# Patient Record
Sex: Female | Born: 1965 | Race: White | Hispanic: No | Marital: Married | Smoking: Former smoker
Health system: Southern US, Community
[De-identification: ages and names within clinical notes are randomized; demographics above are authoritative.]

## PROBLEM LIST (undated history)

## (undated) ENCOUNTER — Emergency Department: Discharge status: Absent without leave | Payer: Self-pay

## (undated) DIAGNOSIS — G43009 Migraine without aura, not intractable, without status migrainosus: Principal | ICD-10-CM

## (undated) DIAGNOSIS — S0990XA Unspecified injury of head, initial encounter: Secondary | ICD-10-CM

## (undated) DIAGNOSIS — S0990XS Unspecified injury of head, sequela: Secondary | ICD-10-CM

## (undated) DIAGNOSIS — F329 Major depressive disorder, single episode, unspecified: Secondary | ICD-10-CM

## (undated) DIAGNOSIS — K219 Gastro-esophageal reflux disease without esophagitis: Secondary | ICD-10-CM

## (undated) DIAGNOSIS — G4700 Insomnia, unspecified: Secondary | ICD-10-CM

## (undated) DIAGNOSIS — G44309 Post-traumatic headache, unspecified, not intractable: Secondary | ICD-10-CM

## (undated) DIAGNOSIS — F32A Depression, unspecified: Secondary | ICD-10-CM

## (undated) DIAGNOSIS — R569 Unspecified convulsions: Secondary | ICD-10-CM

## (undated) DIAGNOSIS — F445 Conversion disorder with seizures or convulsions: Secondary | ICD-10-CM

## (undated) DIAGNOSIS — F1011 Alcohol abuse, in remission: Secondary | ICD-10-CM

## (undated) DIAGNOSIS — K859 Acute pancreatitis without necrosis or infection, unspecified: Secondary | ICD-10-CM

## (undated) DIAGNOSIS — E669 Obesity, unspecified: Secondary | ICD-10-CM

## (undated) HISTORY — DX: Gastro-esophageal reflux disease without esophagitis: K21.9

## (undated) HISTORY — DX: Acute pancreatitis without necrosis or infection, unspecified: K85.90

## (undated) HISTORY — DX: Alcohol abuse, in remission: F10.11

## (undated) HISTORY — PX: KNEE ARTHROSCOPY: SUR90

## (undated) HISTORY — PX: OTHER SURGICAL HISTORY: SHX169

## (undated) HISTORY — PX: ABDOMINAL HYSTERECTOMY: SHX81

## (undated) HISTORY — DX: Insomnia, unspecified: G47.00

## (undated) HISTORY — PX: COLONOSCOPY: SHX174

## (undated) HISTORY — DX: Conversion disorder with seizures or convulsions: F44.5

## (undated) HISTORY — DX: Migraine without aura, not intractable, without status migrainosus: G43.009

## (undated) HISTORY — PX: SHOULDER ARTHROSCOPY: SHX128

## (undated) HISTORY — PX: CHOLECYSTECTOMY: SHX55

## (undated) HISTORY — PX: SHOULDER SURGERY: SHX246

## (undated) HISTORY — DX: Obesity, unspecified: E66.9

## (undated) HISTORY — DX: Unspecified convulsions: R56.9

---

## 1996-05-05 HISTORY — PX: CARPAL TUNNEL RELEASE: SHX101

## 1999-07-01 ENCOUNTER — Encounter: Payer: Self-pay | Admitting: Orthopedic Surgery

## 1999-07-01 ENCOUNTER — Encounter: Admission: RE | Admit: 1999-07-01 | Discharge: 1999-07-01 | Payer: Self-pay | Admitting: Orthopedic Surgery

## 2000-05-05 HISTORY — PX: CHOLECYSTECTOMY: SHX55

## 2001-10-09 ENCOUNTER — Encounter: Payer: Self-pay | Admitting: Orthopedic Surgery

## 2001-10-09 ENCOUNTER — Encounter: Admission: RE | Admit: 2001-10-09 | Discharge: 2001-10-09 | Payer: Self-pay | Admitting: Orthopedic Surgery

## 2002-03-09 ENCOUNTER — Other Ambulatory Visit: Admission: RE | Admit: 2002-03-09 | Discharge: 2002-03-09 | Payer: Self-pay | Admitting: Obstetrics and Gynecology

## 2002-06-24 ENCOUNTER — Observation Stay (HOSPITAL_COMMUNITY): Admission: AD | Admit: 2002-06-24 | Discharge: 2002-06-25 | Payer: Self-pay | Admitting: Obstetrics and Gynecology

## 2002-06-24 ENCOUNTER — Encounter (INDEPENDENT_AMBULATORY_CARE_PROVIDER_SITE_OTHER): Payer: Self-pay | Admitting: *Deleted

## 2003-04-12 ENCOUNTER — Other Ambulatory Visit: Admission: RE | Admit: 2003-04-12 | Discharge: 2003-04-12 | Payer: Self-pay | Admitting: Obstetrics and Gynecology

## 2004-05-28 ENCOUNTER — Other Ambulatory Visit: Admission: RE | Admit: 2004-05-28 | Discharge: 2004-05-28 | Payer: Self-pay | Admitting: Obstetrics and Gynecology

## 2005-04-27 ENCOUNTER — Emergency Department (HOSPITAL_COMMUNITY): Admission: EM | Admit: 2005-04-27 | Discharge: 2005-04-27 | Payer: Self-pay | Admitting: Emergency Medicine

## 2005-06-25 ENCOUNTER — Other Ambulatory Visit: Admission: RE | Admit: 2005-06-25 | Discharge: 2005-06-25 | Payer: Self-pay | Admitting: Obstetrics and Gynecology

## 2007-05-06 HISTORY — PX: ABDOMINAL HYSTERECTOMY: SHX81

## 2007-08-05 ENCOUNTER — Inpatient Hospital Stay (HOSPITAL_COMMUNITY): Admission: EM | Admit: 2007-08-05 | Discharge: 2007-08-06 | Payer: Self-pay | Admitting: Emergency Medicine

## 2007-08-06 ENCOUNTER — Ambulatory Visit: Payer: Self-pay | Admitting: Psychiatry

## 2008-11-13 ENCOUNTER — Ambulatory Visit (HOSPITAL_COMMUNITY): Admission: RE | Admit: 2008-11-13 | Discharge: 2008-11-14 | Payer: Self-pay | Admitting: Obstetrics and Gynecology

## 2008-11-13 ENCOUNTER — Encounter (INDEPENDENT_AMBULATORY_CARE_PROVIDER_SITE_OTHER): Payer: Self-pay | Admitting: Obstetrics and Gynecology

## 2009-07-18 ENCOUNTER — Encounter: Admission: RE | Admit: 2009-07-18 | Discharge: 2009-07-18 | Payer: Self-pay | Admitting: Orthopedic Surgery

## 2010-08-11 LAB — CBC
HCT: 26 % — ABNORMAL LOW (ref 36.0–46.0)
HCT: 39.3 % (ref 36.0–46.0)
Hemoglobin: 13.5 g/dL (ref 12.0–15.0)
MCHC: 33.8 g/dL (ref 30.0–36.0)
MCV: 92.7 fL (ref 78.0–100.0)
RBC: 2.8 MIL/uL — ABNORMAL LOW (ref 3.87–5.11)
RBC: 4.32 MIL/uL (ref 3.87–5.11)
RDW: 13.8 % (ref 11.5–15.5)
WBC: 12.5 10*3/uL — ABNORMAL HIGH (ref 4.0–10.5)

## 2010-08-20 ENCOUNTER — Other Ambulatory Visit: Payer: Self-pay | Admitting: Orthopedic Surgery

## 2010-08-20 DIAGNOSIS — M25511 Pain in right shoulder: Secondary | ICD-10-CM

## 2010-08-26 ENCOUNTER — Ambulatory Visit
Admission: RE | Admit: 2010-08-26 | Discharge: 2010-08-26 | Disposition: A | Discharge status: Home / Self Care | Payer: Federal, State, Local not specified - PPO | Source: Ambulatory Visit | Attending: Orthopedic Surgery | Admitting: Orthopedic Surgery

## 2010-08-26 DIAGNOSIS — M25511 Pain in right shoulder: Secondary | ICD-10-CM

## 2010-09-02 ENCOUNTER — Emergency Department (HOSPITAL_BASED_OUTPATIENT_CLINIC_OR_DEPARTMENT_OTHER)
Admission: EM | Admit: 2010-09-02 | Discharge: 2010-09-03 | Disposition: A | Discharge status: Home / Self Care | Payer: Federal, State, Local not specified - PPO | Attending: Emergency Medicine | Admitting: Emergency Medicine

## 2010-09-02 DIAGNOSIS — R51 Headache: Secondary | ICD-10-CM | POA: Insufficient documentation

## 2010-09-02 DIAGNOSIS — R112 Nausea with vomiting, unspecified: Secondary | ICD-10-CM | POA: Insufficient documentation

## 2010-09-02 DIAGNOSIS — K5289 Other specified noninfective gastroenteritis and colitis: Secondary | ICD-10-CM | POA: Insufficient documentation

## 2010-09-02 DIAGNOSIS — R1011 Right upper quadrant pain: Secondary | ICD-10-CM | POA: Insufficient documentation

## 2010-09-02 DIAGNOSIS — R197 Diarrhea, unspecified: Secondary | ICD-10-CM | POA: Insufficient documentation

## 2010-09-02 LAB — URINALYSIS, ROUTINE W REFLEX MICROSCOPIC
Bilirubin Urine: NEGATIVE
Glucose, UA: NEGATIVE mg/dL
Hgb urine dipstick: NEGATIVE
Ketones, ur: NEGATIVE mg/dL
Protein, ur: NEGATIVE mg/dL
pH: 6 (ref 5.0–8.0)

## 2010-09-02 LAB — COMPREHENSIVE METABOLIC PANEL
ALT: 23 U/L (ref 0–35)
Alkaline Phosphatase: 84 U/L (ref 39–117)
BUN: 12 mg/dL (ref 6–23)
CO2: 28 mEq/L (ref 19–32)
Calcium: 9.8 mg/dL (ref 8.4–10.5)
GFR calc non Af Amer: >60 mL/min (ref >60)
Glucose, Bld: 98 mg/dL (ref 70–99)
Sodium: 146 mEq/L — ABNORMAL HIGH (ref 135–145)

## 2010-09-02 LAB — DIFFERENTIAL
Basophils Absolute: 0 10*3/uL (ref 0.0–0.1)
Eosinophils Relative: 2 % (ref 0–5)
Lymphocytes Relative: 39 % (ref 12–46)
Lymphs Abs: 4.2 10*3/uL — ABNORMAL HIGH (ref 0.7–4.0)
Monocytes Absolute: 0.5 10*3/uL (ref 0.1–1.0)
Monocytes Relative: 5 % (ref 3–12)
Neutro Abs: 5.9 10*3/uL (ref 1.7–7.7)

## 2010-09-02 LAB — CBC
HCT: 39.7 % (ref 36.0–46.0)
Hemoglobin: 13.3 g/dL (ref 12.0–15.0)
MCHC: 33.5 g/dL (ref 30.0–36.0)
MCV: 83.4 fL (ref 78.0–100.0)
RDW: 13.9 % (ref 11.5–15.5)

## 2010-09-02 LAB — LIPASE, BLOOD: Lipase: 388 U/L — ABNORMAL HIGH (ref 23–300)

## 2010-09-03 ENCOUNTER — Emergency Department (INDEPENDENT_AMBULATORY_CARE_PROVIDER_SITE_OTHER): Payer: Federal, State, Local not specified - PPO

## 2010-09-03 DIAGNOSIS — R110 Nausea: Secondary | ICD-10-CM

## 2010-09-03 DIAGNOSIS — R1011 Right upper quadrant pain: Secondary | ICD-10-CM

## 2010-09-03 DIAGNOSIS — Z9089 Acquired absence of other organs: Secondary | ICD-10-CM

## 2010-09-03 DIAGNOSIS — R748 Abnormal levels of other serum enzymes: Secondary | ICD-10-CM

## 2010-09-03 DIAGNOSIS — Z90710 Acquired absence of both cervix and uterus: Secondary | ICD-10-CM

## 2010-09-03 MED ORDER — IOHEXOL 300 MG/ML  SOLN
100.0000 mL | Freq: Once | INTRAMUSCULAR | Status: AC | PRN
  Administered 2010-09-03: 100 mL via INTRAVENOUS

## 2010-09-17 NOTE — Op Note (Signed)
Lindsay Hamilton, Lindsay Hamilton             ACCOUNT NO.:  [id number]   MEDICAL RECORD NO.:  [id number]          PATIENT TYPE:  OIB   LOCATION:  9309                          FACILITY:  [facility]   PHYSICIAN:  Juluis Mire, M.D.   DATE OF BIRTH:  [birth year]   DATE OF PROCEDURE:  11/13/2008  DATE OF DISCHARGE:                               OPERATIVE REPORT   PREOPERATIVE DIAGNOSES:  Menorrhagia and dysmenorrhea secondary to  adenomyosis.   POSTOPERATIVE DIAGNOSES:  Menorrhagia and dysmenorrhea secondary to  adenomyosis.   OPERATIVE PROCEDURE:  Laparoscopic-assisted vaginal hysterectomy.   SURGEON:  Juluis Mire, MD   ASSISTANT:  Zelphia Cairo, MD   ESTIMATED BLOOD LOSS:  5-600 mL.   PACKS AND DRAINS:  None.   INTRAOPERATIVE BLOOD PLACED:  Placed none.   COMPLICATIONS:  None.   INDICATIONS:  Dictated in the history and physical.   PROCEDURE IN DETAILS:  The patient was taken to OR, placed in supine  position.  After satisfactory level of general endotracheal anesthesia  was obtained, the patient was placed in the dorsal lithotomy position  using the Allen stirrups.  The abdomen, perineum, and vagina were  prepped out of Betadine.  Bladder was emptied by in-and-out  catheterization.  A Hulka tenaculum was put in place and secured.   The patient was then draped in sterile field.  A subumbilical incision  was made with a knife, carried through the subcutaneous tissue.  Fascia  was entered sharply and the incision in the fascia was extended  laterally.  Peritoneum was entered with blunt finger pressure.  Open  laparoscopic trocar was put in place and secured.  Abdomen was inflated  with carbon dioxide.  Laparoscope was introduced.  Visualization  revealed uterus to be normal size and shape.  She had a simple cyst at  the right ovary.  Left ovary is unremarkable.  No pelvic pathology was  noted.  The appendix was seen, it was unremarkable.  Both lateral  gutters were clear.  A 5-mm  trocar was put in place in the suprapubic  area.  Using the EnSeal, first the right utero-ovarian pedicle was  cauterized and incised.  The right tube and mesosalpinx were cauterized  and incised, and the right round ligament was cauterized and incised.  We then went to the left side.  The left utero-ovarian pedicle was  cauterized and incised.  Left tube and mesosalpinx were cauterized and  incised, and the left round ligament was cauterized and incised.  We had  good hemostasis.  The abdomen was deflated with carbon dioxide.  Laparoscope was removed.   Hulka tenaculum was taken out.  The patient's legs were repositioned.  A  weighted speculum was placed in the vaginal vault.  The cervix was  grasped with Christella Hartigan tenaculum.  Cul-de-sac was entered sharply.  Both  uterosacral ligaments were clamped, cut, and suture ligated with 0  Vicryl.  Reflection of the vaginal mucosa anteriorly was incised and the  bladder was dissected superiorly.  Paracervical tissue was clamped, cut,  and suture ligated with 0 Vicryl.  We did notice some brisk  bleeding.  We felt it was coming from the vaginal cuff.  We identified one area  brought on and sutured with figure-of-eight of 0 Vicryl.  Next, the  vesicouterine space was identified, entered sharply.  Retractors were  put in place.  Using the clamp, cut, and tie technique with suture  ligature of 0 Vicryl, the parametrium was serially separated from sides  of the uterus.  Brisk bleeding continued.  At this point in time, the  uterus was flipped.  The remaining pedicles were clamped and cut.  These  were secured with free ties of 0 Vicryl.  The uterus and cervix were  passed off the operative field and sent to Pathology.  At this point in  time, we identified bleeding from the left vaginal cuff.  This was  brought under control with figure-of-eights of 0 Vicryl.  This brought  about the complete hemostasis at this point.  I felt this was the area  of  bleeding.  At this point in time, posterior cuff was closed with a  running locking suture of 2-0 Monocryl.  Vaginal mucosa was closed with  figure-of-eights of 2-0 Monocryl.  Foley was placed to straight drain,  then she did obtain abundance amount of clear urine.  The patient's legs  were repositioned.  Laparoscope was reintroduced.  Visualization  revealed basically hemostatic cuff and ovaries.  It was still brought in  the bipolar and cauterized a few spots on each adnexa as well as a few  spots in the vaginal cuff.  Urine output remained clear and adequate.  We deflated the abdomen.  Revisualization revealed no active bleeding  processes.  We irrigated removing all irrigation.  At this point in  time, the abdomen was deflated with carbon dioxide.  All trocars  removed.  Subumbilical fascia was closed with figure-of-eight of 0  Vicryl.  Skin with interrupted subcuticulars of 4-0 Vicryl.  Suprapubic  incision was closed with Dermabond.  The patient was taken out of the  dorsal lithotomy position.  Once alert and extubated, transferred to  recovery room in good condition.  Sponge, instrument, and needle count  reported as correct by circulating nurse x2.      Juluis Mire, M.D.  Electronically Signed     JSM/MEDQ  D:  11/13/2008  T:  11/13/2008  Job:  [id number]

## 2010-09-17 NOTE — Procedures (Signed)
EEG NUMBER:  [id number].   CLINICAL HISTORY:  The patient is a 45 year old with bipolar affective  disorder and depression.  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  The patient has had  bizarre movements of thrashing, twisting of her head, jerking one  extremity and then another.  The activity appears to be nonepileptic in  nature.  Study is being done look for the presence of seizures (780.39).   PROCEDURE:  The tracing is carried out on a 32-channel digital Cadwell  recorder reformatted into 16-channel montages with 1 devoted to EKG.  The patient was awake during the recording.  International 10/20 system  of lead placement was used.   MEDICATIONS:  Include:  1. Wellbutrin.  2. Pristiq.  3. Depakote.  4. Lovenox.  5. Cerebyx.  6. Geodon.  7. Tylenol.  8. Klonopin.  9. Toradol.  10.Ultram.   DESCRIPTION OF FINDINGS:  Dominant frequency at times is 7 Hz rhythmic  activity; at other times, 10-11 Hz 75 microvolt alpha range activity  with associated mixed frequency upper theta range components.   The patient has 6 episodes of behaviors.  During this time, rhythmic  theta range activity of anywhere from 5-7 Hz is seen.  At times, the  background activity can be seen.  At other times, it is obscured by  muscle artifact.  The background always returns to normal.   The first episode occurred between 12:11:16, and 12:12:10.  It was  associated with jerking of a left hand and hitting the hand on the bed.   The second occurred between 12:31:42 and 12:15:13.  The patient had  jerking of her limbs, rolling of her head, hands sitting the side of  bed.  She calmed down a bit with the right hand flapping in the air  which slowly subsided.  As less movement was present, muscle artifact  could to be seen superimposed upon the alpha background.   The third episode occurred between 12:19:03 and 12:19:52.  The patient  rolled to the left,  rolling her head, jerking and hitting her hands on  the bed.   The fourth episode occurred 12:21:42.  The patient started turning to  the left side, hitting her hands, and jerking.  She then begin kicking  with her feet, rolling her head back and forth slowly.  This slowly  subsided.  The fourth episode happened 12:26:49 to 12:28:14.  The  patient's right hand twitched followed by twisting.  There was  simultaneous twitching of the right hand and jerking of the left hand.  The final episode happened between 12:31:15 and 12:31:21 with some  twitching of the left hand.   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      Deanna Artis. [name], M.D.  Electronically Signed     FAO:ZHYQ  D:  08/06/2007 17:06:48  T:  08/06/2007 21:59:34  Job #:  [id number]   cc:   Santina Evans A. [name], M.D.  Fax: [phone]

## 2010-09-17 NOTE — Discharge Summary (Signed)
NAMEMOLLEE, NEER             ACCOUNT NO.:  [id number]   MEDICAL RECORD NO.:  [id number]          PATIENT TYPE:  OIB   LOCATION:  9309                          [facility]ACILITY:  [facility]   PHYSICIAN:  Juluis Mire, M.D.   DATE OF BIRTH:  1966/01/21   DATE OF ADMISSION:  11/13/2008  DATE OF DISCHARGE:  11/14/2008                               DISCHARGE SUMMARY   PREOPERATIVE DIAGNOSIS:  Menorrhagia secondary to adenomyosis.   DISCHARGE DIAGNOSIS:  Menorrhagia secondary to adenomyosis.   OPERATIVE PROCEDURE:  Laparoscopic-assisted vaginal hysterectomy.   For complete history and physical, please see dictated note  corresponding the hospital.  The patient underwent above-noted surgery.  Postop did well.  Postop hemoglobin 8.8 was consistent with blood loss  during the procedure.  At the time of discharge, she was tolerating a  regular diet.  Her Foley had been discontinued.  She was voiding without  difficulty.  She was also ambulating well.  Both incisions were intact.  Her abdomen was soft, nontender.  Bowel sounds were active.  She had no  active vaginal bleeding.   In terms of complications encountered during this stay in the hospital,  the patient discharged home in stable condition.   DISPOSITION:  Routine postop instructions already given.  She is to  avoid heavy lifting, vaginal entrance, or driving of a car.  She is to  watch for signs of infection, nausea, vomiting, increasing abdominal  pain, or active vaginal bleeding.  Discharged home on Tylox needed for  pain.   PLAN:  Followup in the office in 1 week dictation.      Juluis Mire, M.D.  Electronically Signed     JSM/MEDQ  D:  11/14/2008  T:  11/14/2008  Job:  [id number]

## 2010-09-17 NOTE — H&P (Signed)
NAME:  Lindsay Hamilton, Lindsay Hamilton NO.:  [id number]   MEDICAL RECORD NO.:  [id number]          PATIENT TYPE:  OIB   LOCATION:  9309                          [facility]ACILITY[facility]  [facility]   PHYSICIAN:  Juluis Mire, M.D.   DATE OF BIRTH:  Dec 12, 1965   DATE OF ADMISSION:  11/13/2008  DATE OF DISCHARGE:                              HISTORY & PHYSICAL   HISTORY OF PRESENT ILLNESS:  The patient is a 45 year old gravida 2,  para 2 female, who presents for laparoscopic-assisted vaginal  hysterectomy.   In relation to the present admission, because of menorrhagia and  dysmenorrhea, the patient underwent a previous laparoscopy,  hysteroscopy, and cryoablation in 2004.  She initially did well, cycles  now have come back on a more regular basis, have become increasingly  heavier and more uncomfortable.  The biggest issue that she has a  significant pain and discomfort.  Her cycles have been unresponsive to  medications.  Ultrasound evaluation has been highly suggestive of  adenomyosis.  Because of the pain and discomfort, she now presents for  laparoscopic-assisted vaginal hysterectomy for management of increasing  pain and menorrhagia.   IN TERMS OF ALLERGIES:  She has no known drug allergies.   MEDICATIONS:  1. Lexapro 20 mg daily.  2. Diazepam 10 mg as needed.   PAST MEDICAL HISTORY:  History of migraine headaches.  Otherwise, usual  childhood disease without any significant sequelae.   PAST SURGICAL HISTORY:  She has had 2 cesarean section.  She has had 5  right knee scopes.  She has 2 left knee scopes.  Left shoulders scope.  Gallbladder removed and right carpal tunnel surgery.   OBSTETRICAL HISTORY:  As noted above.  She has had 2 cesarean sections.   SOCIAL HISTORY:  Reveals no tobacco or alcohol use.   FAMILY HISTORY:  Noncontributory.   REVIEW OF SYSTEMS:  Noncontributory.   PHYSICAL EXAMINATION:  VITAL SIGNS:  Stable.  The patient is afebrile.  HEENT:  The patient is  normocephalic.  Pupils are equal, round, reactive  to light and accommodation.  Extraocular were intact.  Sclerae and  conjunctivae are clear.  Oropharynx is clear.  NECK:  Without thyromegaly.  BREASTS:  No discrete masses.  LUNGS:  Clear.  CARDIOVASCULAR:  Regular rate without murmurs or gallops.  ABDOMEN:  Benign.  No mass, organomegaly, or tenderness.  PELVIC:  Normal external genitalia.  Vaginal mucosa is clear.  Cervix  unremarkable, normal size, shape, and contour.  Adnexa free of masses or  tenderness.  EXTREMITIES:  Trace edema.  NEUROLOGIC:  Grossly within normal limits.   IMPRESSION:  1. Increasing menorrhagia and dysmenorrhea.  2. Previous cryoablation.   PLAN:  The patient will undergo laparoscopic-assisted vaginal  hysterectomy.  Alternatives in terms of medical therapy have been  discussed.  Risks of surgery have been explained including the risk of  infection.  The risk of hemorrhage could require transfusion with risk  of AIDS or hepatitis.  Risk of injury to adjacent organs including  bladder, bowel, ureters that could require further exploratory surgery.  Risk of deep venous thrombosis and  pulmonary embolus.  The patient  expressed understand of indications and potential risks.       Juluis Mire, M.D.  Electronically Signed     JSM/MEDQ  D:  11/13/2008  T:  11/13/2008  Job:  [id number]

## 2010-09-17 NOTE — Discharge Summary (Signed)
Lindsay Hamilton, Lindsay Hamilton             ACCOUNT NO.:  [id number]   MEDICAL RECORD NO.:  [id number]          PATIENT TYPE:  INP   LOCATION:  3314                         FACILITY:  [facility]   PHYSICIAN:  Deanna Artis. Hickling, M.D.DATE OF BIRTH:  [birth year]   DATE OF ADMISSION:  08/04/2007  DATE OF DISCHARGE:                               DISCHARGE SUMMARY   FINAL DIAGNOSIS:  Psychogenic nonepileptic seizures, 780.39.   PROCEDURES:  1. MRI brain.  2. EEG.   COMPLICATIONS:  None.   HOSPITAL SUMMARY:  The patient is a 45 year old woman who has had  longstanding history of bipolar affective disorder treated by Dr. [name].  The patient had a head injury 1-1/2 years ago with negative  scan and has not returned to work.  The patient was noted to have  increased bruising and epistaxis on Depakote.  The patient was also on  Lamictal concurrently.  Lamictal was increased in dose and Depakote was  dropped and Geodon was added earlier this week.   On the day of admission, the patient had episode of feeling strange with  visual disturbance, increasing tremulous behavior, and then became  unresponsive without loss of continence or true tonic-clonic activity  that lasted up to 40 minutes.  The patient had two more in the emergency  room.  She was given Ativan 8 mg and then given a gram of fosphenytoin.   The patient was admitted by Dr. Marcelino Freestone who had a nonfocal  neurologic examination.  She was able to witness some of the episodes  and felt that they were nonepileptic in nature as did nursing.   The patient's episodes have decreased in duration.  During one, the  patient had an episode lasting for about a minute during which time she  sat up grabbing the arm rails, patting her hands on the mattress, moving  her head from side-to-side, and lifting her legs.  Ammonia poppers were  used twice during the seizure activity.  The patient did not return from  them, but her behavior  decreased to some extent.   The next day an MRI scan of the brain was carried out and it was normal.  An EEG was carried out and fortunately during the EEG, she had  nonepileptic event.  The EEG background changed only to the extent and  movement artifact was present.   The patient has been informed of this.  We have requested consultation.  I again called Dr. Electa Sniff on telephone number [phone].  I have asked  him to evaluate her for transfer to [facility].  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  I have also placed a call to Dr. [name] and asked for his  input into this process.   The patient does not have acute neurologic condition and either will  be  discharged to home, because I do not perceive that she is in danger or  will be discharged to [facility].   PHYSICAL EXAMINATION:  VITAL SIGNS:  Glasgow coma score 15, temperature  97.1, blood pressure 111/64, resting pulse 88, respirations 17, oxygen  saturation 98% on room air.  LUNGS:  Clear.  HEART:  No murmurs.  Pulses normal.  ABDOMEN:  Soft.  Bowel sounds normal.  No splenomegaly.  EXTREMITIES:  Normal.  NEUROLOGIC EXAMINATION:  Mental status:  Awake, alert.  Cranial nerves:  Round reactive pupils.  Visual fields full.  Extraocular movements full.  Symmetric facial strength.  Midline tongue and uvula.  Air conduction  greater than bone conduction bilaterally.  Motor examination:  Normal  strength.  No drift.  Fine motor movements normal.  Sensory examination:  Intact.  Gait not tested.   PLAN:  As noted above.   CURRENT MEDICATIONS:  1. Divalproex sodium 500 mg at nighttime.  She is on 40 mg three times      daily.  2.  Pristiq 50 mg daily.  3. Wellbutrin 300 mg daily.  4. Lovenox 40 mg subcutaneous daily.  This can be discontinued when      she becomes ambulatory.  5. Klonopin 1 mg three times a day as needed.  6. Ultram 50 mg every 6 hours as needed.  7. Toradol 30 mg every 8 hours as needed.  8. Acetaminophen 650 mg every 8 hours as needed.  9. Senokot at bedtime as needed.  10.Lamictal has been discontinued.   LABORATORY STUDIES:  Prolactin was 7.0 after one of her events.  This is  at a control range.  Hemoglobin 11.0, hematocrit 32.8, white blood cell  count 9500, 76 polys, 18 lymphs, 2 eosinophils, 4 monos, platelet count  327,000.  Sodium 136, potassium 4.0, chloride 105, CO2 25, glucose 105,  BUN 14, creatinine 0.86, calcium 8.6, total protein 6.0, albumin 3.4,  AST 19, ALT 17, total bilirubin 0.4, alkaline phosphatase 51.   The patient's valproic acid level is 26.2.   The patient is discharged in same condition as admitted although the  episodes were not as long.      Deanna Artis. [name], M.D.  Electronically Signed     [facility]/MEDQ  D:  08/06/2007  T:  08/07/2007  Job:  [id number]   cc:   [name], M.D.

## 2010-09-17 NOTE — H&P (Signed)
NAMEGENI, SKORUPSKI             ACCOUNT NO.:  [id number]   MEDICAL RECORD NO.:  [id number]          PATIENT TYPE:  INP   LOCATION:  3314                         FACILITY:  [facility]   PHYSICIAN:  Gustavus Messing. [name], M.D.DATE OF BIRTH:  1965/05/28   DATE OF ADMISSION:  08/04/2007  DATE OF DISCHARGE:                              HISTORY & PHYSICAL   CHIEF COMPLAINT:  Possible seizures.   HISTORY OF PRESENT ILLNESS:  Lindsay Hamilton is a 45 year old, right-  handed, white woman with a history of chronic headache, depression,  question of bipolar disorder since a head injury which occurred 1-1/2  years ago.  This was an on the job injury.  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  She apparently  slipped on cord in the OR.  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  She has never  had seizures, however, she has not worked any since the injury.  She is  now under the care of a neurologist, Dr. Hale Bogus, in [place] at  [facility] and a psychiatrist, Dr. [name], at [facility].  There are workman's compensation issues pending.  She has  been able to work around the home quite a bit and has been very busy  doing things in the last few days apparently.  However, today she was  feeling strange with some visual distortions, it looked like the mailbox  was moving and she was seeing stars.  She had been exerting herself  around the house in the last few days, according to her husband.  After  the visual distortion, she then became tremulous and unresponsive  without loss of consciousness or tonic-clonic activity with some  shaking.  Her eyes were open, but she did not appear to respond.  This  apparently lasted approximately 40 minutes, according to the husband,  who called EMS and she was still in this state when she was transported.  She had two more similar spells in the emergency  room including one  which had some eye deviation.  She has received a total of 8 mg of  Ativan and 1 g of fosphenytoin.  However, she is alert at this time  without any appearance of a postictal state despite 60 minutes or more  of seizure activity by description.   REVIEW OF SYSTEMS:  Review of systems obtained from the husband show  chronic headache, neck irritation, no chest pain, no shortness of  breath.  No fever or chills.  She has had a bloated feeling.  She had  numerous recent medication changes.   PAST MEDICAL HISTORY:  1. Head injury 1-1/2 years ago with negative workup.  Nothing showed      up on CT or MRI.  2. Depression.  3. Bipolar disorder.  4. Chronic severe head pain secondary to #1.  5. No history of diabetes or hypertension.   PAST SURGICAL HISTORY:  1. Right rotator cuff surgery.  2. Multiple bilateral knee arthroscopic procedures.  3. Cholecystectomy.   CURRENT MEDICATIONS:  1.  Calcium supplement.  2. Depakote 5 mg one nightly, but that was recently decreased from 2      nightly to 1 nightly.  3. Geodon 40 mg three times a day which is fairly recent.  4. Lamictal which was started at 150 mg 2 tablets at bedtime.      According to the patient this was started at this dose without      titration just 3 days ago.  5. Pristiq 50 mg once a day.  6. Wellbutrin, she was on 300 mg once a day and recently increased to      450 mg a day.  7. Zinc sulfate.  8. Clonazepam 1 mg as needed.  9. Tramadol as needed.   ALLERGIES:  No known drug allergies.   SOCIAL HISTORY:  She is married with two children.  No cigarette or  alcohol use.  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   FAMILY HISTORY:  According to the husband, there are seizure issues in  a sister who is on medication.   PHYSICAL EXAMINATION:  VITAL SIGNS:  Temperature 99.6, respirations 18,  blood pressure is 144/77,  pulse 110.  HEENT:  Head is normocephalic, atraumatic.  No tongue lacerations.  NECK:  Supple without bruits.  HEART:  Regular rate and rhythm.  ABDOMEN:  Benign.  EXTREMITIES:  Without edema.  She does not have any rash.  NEUROLOGIC:  She is awake, alert, but reserved.  She does not appear  postictal despite three describe prolonged generalized seizures and 8 mg  of Ativan.  Pupils are equal and reactive.  Visual fields are full.  Extraocular motions are intact.  Facial motor activity is normal.  Facial sensation is patchy.  Hearing is intact.  Palate symmetric and  tongue is midline.  On motor exam,  there is no drift and she has good  strength in all four extremities.  Deep tendon reflexes are 1+ with  downgoing toes.  Coordination with finger-to-nose intact with a  hysterical type of activity with short jerking movements with otherwise  normal movements of her hands.  Heel-to-shin was normal.  Sensory is  patchy.   LABORATORY DATA AND X-RAY FINDINGS:  CT of the head shows nothing acute.  Chest x-ray to my review looks normal, but does have an official review  of that.   Sodium 136, potassium 4.0, chloride 105, CO2 25, BUN 14, creatinine  0.86, calcium 8.6.  White blood cell count 9.5, hemoglobin 11.0,  hematocrit 32.8, platelets 227.   IMPRESSION:  Seizure versus pseudoseizure.  I am inclined to think these  are pseudoseizures given the circumstances, but cannot really tell that  not having witnessed one.   PLAN:  Will admit to observe.  She was loaded with Dilantin, but I will  hold maintenance for now.  Discontinue the Lamictal.  She was only on  that x3 days.  That was certainly started at a very high dose.  Will  maintain the lower  Depakote dose.  Will decrease the Wellbutrin back to the 300.  I will  keep her on the Geodon for now.  Will check an EEG and MRI.  Will  discharge her when she is stable to follow up with her primary  neurologist and psychiatrist for further  medication adjustments.      Catherine A. [name], M.D.  Electronically Signed     CAW/MEDQ  D:  08/05/2007  T:  08/05/2007  Job:  [id number]

## 2010-09-17 NOTE — Consult Note (Signed)
Lindsay Hamilton, SEAT             ACCOUNT NO.:  [id number]   MEDICAL RECORD NO.:  [id number]          PATIENT TYPE:  INP   LOCATION:  3314                         FACILITY:  [facility]   PHYSICIAN:  Anselm Jungling, MD  DATE OF BIRTH:  August 01, 1965   DATE OF CONSULTATION:  08/06/2007  DATE OF DISCHARGE:                                 CONSULTATION   IDENTIFYING DATA AND REASON FOR REFERRAL:  The patient is a 45 year old  married Caucasian female under the care of Dr. [name] and Dr. [name].  Please refer to Dr. [name] dictated report for further details  pertaining to the history, circumstances, and medical issues involved in  this case.  Psychiatric consultation is requested because of an  impression of conversion disorder with pseudoseizures.  Psychiatric  consultation is requested to assess mental status and make  recommendations.   HISTORY OF THE PRESENTING PROBLEMS:  Please refer to Dr. [name]  note.  Dr. [name] indicates to me in our conversations today that he  is convinced that the patient he is having nonepileptic seizures.  He  feels that the extensive medical and neurological workup that has been  done supports this.   The patient does have a psychiatric history of mood disorder, and is  currently under the care of Dr. [name], local psychiatrist.  According to Dr. [name], the patient was recently started on Depakote  for mood disturbance, which resulted in decreased platelets.  In  response to this, Dr. [name] to reduce the Depakote dose, and then  initiated a moderately large dose, for a starting dose, of Lamictal, in  conjunction with Geodon.  Following this, the patient began to have more  episodes of nonepileptic seizure.  This led to her hospitalization here  and further workup.   The patient also has a history of head trauma last year, but CT scan has  not shown any acute changes.   At this point, Dr. [name] feels that the patient is  appropriate for  discharge from the medical hospital.   MENTAL STATUS AND OBSERVATIONS:  The patient is a well-nourished,  normally-developed, adult female who is seated in the chair next to her  intensive care bed.  Her husband is nearby.  They are both very pleasant  and cooperative in my interview and discussion with them.  The patient  is fully alert, oriented in all spheres, with normal sensorium.  No  signs or symptoms of psychosis or thought disorder, delirium or  confusion.  Her mood appears to be moderately depressed, probably  appropriate to the situation.  She is a good historian.   I discussed with the patient and her husband Dr. [name] impression  that her seizures do not represent any form of epilepsy, and are most  likely related to psychological stressors, and possibly due to the  medication changes that have occurred in the last week or so involving  Lamictal, Geodon, and reduction in Depakote dose.  The patient and her  husband appear to be accepting of this.   I discussed with them the possibility of the patient coming to the  inpatient psychiatric program for a brief stay in two to three days for  purposes of medication review, adjustment, stabilization, as well as an  opportunity to become involved in therapeutic groups and activities  geared towards developing better understanding of her underlying  disorder and dynamics, and acquisition of coping skills, as well as the  possibility for family counseling.  The patient and her husband are  receptive to this suggestion, and indicate that they are apprehensive  about the alternative of the patient going home right now, and the  possibility of having further episodes, which have been very frightening  and traumatic for the patient's husband and two teenage daughters.   IMPRESSION:  The patient has a history of mood disorder, currently under  the care of Dr. [name], and apparently nonepileptic seizures,   possibly representing a form of conversion disorder.  I believe at this  time it would be advantageous for the patient to be admitted to the  inpatient psychiatry program.  I did raise the possibility with Dr.  [name] of having the patient remain in the medical hospital for a  period of further observation.  The patient has had two further episodes  earlier this morning, that is within the past six hours.  I suggested  that it might be desirable to have a period of 24 hours without such  episodes prior to transfer to the inpatient psychiatric service.  Dr.  [name] did not feel that this was an appropriate approach, and he  predicts the patient will continue to have such episodes.  In spite of  this, he indicates that if she is on the psychiatric unit, that no  special measures or seizure precautions should be taken and would be  completely unnecessary under the circumstances.  I indicated to Dr.  [name] that our nursing staff may have concerns about such a plan, and  I will need to review it with him carefully before we can accept the  patient to the inpatient psychiatric service.  Dr. [name] indicated  that he felt that if the patient could not go to the inpatient  psychiatric service that it would be best for her to be discharged home,  and that he would not consider continuing her further in the medical  setting at this time.   DIAGNOSTIC IMPRESSION:  AXIS I:  Mood disorder, not otherwise specified.  Conversion disorder, not otherwise specified.  AXIS II:  Deferred.  AXIS III:  Please refer to Dr. [name] dictation.  AXIS IV:  Stressors severe.  AXIS V:  GAF 50.   RECOMMENDATIONS:  As above.  I have contacted our assessment office who  will be in touch with our nursing administration regarding the possible  acceptance of this patient to our program.  I have also discussed with  the patient and her husband the possibility of coming to our psychiatric  intensive outpatient  program.  The only drawback to this is that program  does not met on the weekends, and today is Friday.  This would make  necessary the patient going home over the weekend and starting the  intensive outpatient program three days from now.  Once again, there are  concerns about sending the patient home and her having further episodes  in front of her children, and the traumatic consequences that for the  family.   Thank you for involving me in this patient's care.  I trust that we will  be able to resolve sometime during today  by close of business the best  possible overall course and treatment plan for this complex patient.      Anselm Jungling, MD  Electronically Signed     SPB/MEDQ  D:  08/06/2007  T:  08/06/2007  Job:  [id number]

## 2010-09-20 NOTE — H&P (Signed)
NAME:  Lindsay Hamilton, Lindsay Hamilton                       ACCOUNT NO.:  [id number]   MEDICAL RECORD NO.:  [id number]                   PATIENT TYPE:  AMB   LOCATION:  SDC                                  FACILITY:  [facility]   PHYSICIAN:  Juluis Mire, M.D.                DATE OF BIRTH:  09-Mar-1966   DATE OF ADMISSION:  DATE OF DISCHARGE:                                HISTORY & PHYSICAL   HISTORY OF PRESENT ILLNESS:  The patient is a 45 year old, Gravida II, Para  II married white female who presents for diagnostic laparoscopy with laser  standby as well as hysteroscopy with cryo ablation. In relation to the  present admission, the patient has been followed up in the office for  routine physicals. Cycles remain regular at the present time. She describes  7 days of flow with 5-6 days of being heavy and changing pads 7-10 times per  day with massive clots and increasing pain. This has been unresponsive to  over-the-counter management or birth control pills. Saline infusion  ultrasound was unremarkable. The patient now presents for the above noted  surgery to rule out pelvic endometriosis and to proceed with cryo ablation  as a management option for the menorrhagia. Alternatives have been discussed  including continuation on birth control pills, versus more aggressive  therapy in the form of hysterectomy.   ALLERGIES:  No known drug allergies.   MEDICATIONS:  Include birth control pills and iron sulfate supplementation.   PAST MEDICAL HISTORY:  Usual childhood diseases without any significant  sequela.   OBSTETRICAL HISTORY:  She has had two prior low transverse Cesarean sections  for both pregnancies.   FAMILY HISTORY:  Noncontributory.   SOCIAL HISTORY:  No tobacco or alcohol use.   REVIEW OF SYSTEMS:  Noncontributory.   PHYSICAL EXAMINATION:  VITAL SIGNS: Afebrile with stable vital signs.  HEENT: Normocephalic, atraumatic. Pupils are equal, round, and reactive to  light and  accommodation. Extraocular muscles intact. Sclera and conjunctiva  clear. Oropharynx clear.  NECK: Without thyromegaly.  BREAST: No discrete masses.  LUNGS: Clear.  CARDIAC: Regular rate and rhythm. No murmur, rub, or gallop.  ABDOMEN: Benign. No masses, organomegaly or tenderness.  PELVIC: Normal external genitalia. Vaginal mucosa clear. Cervix  unremarkable. Uterus normal size, shape, and contour. Adnexa free of masses  or tenderness.  EXTREMITIES: Trace edema.  NEURO: Grossly within normal limits.   IMPRESSION:  1. Menorrhagia.  2. Pelvic pain rule out  endometriosis.   PLAN:  The patient is to undergo diagnostic laparoscopy with laser standby  for evaluation of pelvic pain. She will subsequently have hysteroscopy with  cryo ablation. Success rate for cryo ablation is 70% is quoted. Risks of  surgery have been explained including the risk of infection. Risk of  hemorrhage could require transfusion with the risk of hepatitis. Risk of  injury to adjacent organs that could require further exploratory surgery.  Risk  of deep vein thrombosis or pulmonary embolus. Also anesthetic concerns  are discussed. Alternatives for management have also been explained.                                               Juluis Mire, M.D.    JSM/MEDQ  D:  06/24/2002  T:  06/24/2002  Job:  [id number]

## 2010-09-20 NOTE — Op Note (Signed)
NAME:  Lindsay Hamilton, Lindsay Hamilton                       ACCOUNT NO.:  [id number]   MEDICAL RECORD NO.:  [id number]                   PATIENT TYPE:  AMB   LOCATION:  SDC                                  FACILITY:  [facility]   PHYSICIAN:  Juluis Mire, M.D.                DATE OF BIRTH:  [birth year]   DATE OF PROCEDURE:  06/24/2002  DATE OF DISCHARGE:                                 OPERATIVE REPORT   PREOPERATIVE DIAGNOSIS:  1. Pelvic pain.  2. Menorrhagia.   POSTOPERATIVE DIAGNOSES:  1. Pelvic pain.  2. Menorrhagia.  3. Pelvic adhesions and possible uterine adenosis.   PROCEDURE:  Open laparoscopy, lysis of adhesions, hysteroscopy, multiple  endometrial biopsies, cryoablation.   SURGEON:  Juluis Mire, M.D.   ASSISTANT:  General endotracheal.   ESTIMATED BLOOD LOSS:  Minimal.   PACKS AND DRAINS:  None.   INTRAOPERATIVE BLOOD REPLACED:  None.   COMPLICATIONS:  None.   INDICATIONS:  Dictated in the history and physical.   DESCRIPTION OF PROCEDURE:  The patient was taken to the operating room and  placed in the supine position.  After a satisfactory level of general  endotracheal anesthesia was obtained.  The patient was placed in the dorsal  supine position using the Allen stirrups.  Abdomen, perineum and vagina were  prepped out with Betadine.  Bladder was emptied with in and out  catheterization.  The Hulka tenaculum was put in place and secured.  The  patient was draped out for laparoscopy.  A subumbilical incision was made  with the knife.  The incision was extended through the subcutaneous tissue.  The fascia was entered sharply and the incision in the fascia extended  laterally.  The perineum was entered bluntly.  There were no palpable  adhesions.  The open trocar was put in place and secured.  Two sutures of 0  Vicryl were put in laterally into the fascia and held.  The laparoscope was  introduced.  There was no evidence of injury to adjacent organs.  The 5 mm  trocars  were then placed in the suprapubic area under direct visualization.  There were some omental adhesions adhered to the abdominal wall.  These were  taken down using the bipolar and scissors.  Visualization of the pelvic  cavity revealed no active endometriosis.  The uterus was enlarged and boggy  consistent with adenomyosis.  Tubes and ovaries were unremarkable.  There  was a simple cyst of the left ovary.  Appendix was visualized and noted to  be anteverted.  The upper abdomen was unremarkable.  The gallbladder was  surgically absent.  At this point in time, the abdomen was deflated of its  carbon dioxide.  All trocars were removed.  The subumbilical fascia was  closed with two figure-of-eights of 0 Vicryl.  The skin was closed with  interrupted subcuticulars of 4-0 Vicryl.  Suprapubic incision was closed  with  Steri-Strips.   The patient's legs were repositioned.  Hulka tenaculum was then removed.  Cervix grasped with a single-tooth tenaculum.  Uterus sounded to  approximately 9 cm.  Cervix serially dilated to a size 35 Pratt dilator.  The single-tooth tenaculum did tear through the cervix.  We then grasped  with a Jacob's tenaculum.  The hysteroscope was then introduced.  The  intrauterine cavity was distended using sorbitol.  Visualization revealed  thickened endometrium.  We did multiple biopsies which were all sent for  pathological review.  There were no polyps or other abnormalities. There  were no signs of perforation or active bleeding.  The hysteroscope was then  removed.  At this point in time, we did repair the rent in the cervix with  two figure-of-eights of 2-0 chromic.  We then regrasped the cervix with a  single-tooth tenaculum.  The cryoablation machine was then used to perform  cryoablation.  We did a 6 minute freeze on the left side followed by a 6  minute freeze on the right side.  We then did a 2 minute freeze in the lower  uterine segment.  There were no signs of  complications or perforations.  The  single-tooth tenaculum and speculum were then removed.  The patient was  taken out of the dorsal lithotomy position, once alert and extubated,  transferred to the recovery room in good condition. Sponge, needle and  instrument counts were reported correct by the circulating nurse x2.                                               Juluis Mire, M.D.    JSM/MEDQ  D:  06/24/2002  T:  06/24/2002  Job:  [id number]

## 2011-01-28 LAB — DIFFERENTIAL
Basophils Relative: 0
Eosinophils Absolute: 0.2
Eosinophils Relative: 2
Monocytes Absolute: 0.4
Monocytes Relative: 4

## 2011-01-28 LAB — CBC
Hemoglobin: 11 — ABNORMAL LOW
Platelets: 227
RDW: 13.9
WBC: 9.5

## 2011-01-28 LAB — COMPREHENSIVE METABOLIC PANEL
ALT: 17
Albumin: 3.4 — ABNORMAL LOW
Alkaline Phosphatase: 51
Chloride: 105
Potassium: 4
Sodium: 136
Total Bilirubin: 0.4
Total Protein: 6

## 2011-09-16 ENCOUNTER — Emergency Department (HOSPITAL_COMMUNITY): Payer: Federal, State, Local not specified - PPO

## 2011-09-16 ENCOUNTER — Encounter (HOSPITAL_COMMUNITY): Payer: Self-pay | Admitting: *Deleted

## 2011-09-16 ENCOUNTER — Emergency Department (HOSPITAL_COMMUNITY)
Admission: EM | Admit: 2011-09-16 | Discharge: 2011-09-17 | Disposition: A | Discharge status: Home / Self Care | Payer: Federal, State, Local not specified - PPO | Attending: Emergency Medicine | Admitting: Emergency Medicine

## 2011-09-16 DIAGNOSIS — R55 Syncope and collapse: Secondary | ICD-10-CM | POA: Insufficient documentation

## 2011-09-16 DIAGNOSIS — R404 Transient alteration of awareness: Secondary | ICD-10-CM | POA: Insufficient documentation

## 2011-09-16 DIAGNOSIS — R51 Headache: Secondary | ICD-10-CM | POA: Insufficient documentation

## 2011-09-16 LAB — BASIC METABOLIC PANEL
CO2: 24 mEq/L (ref 19–32)
Chloride: 110 mEq/L (ref 96–112)
GFR calc non Af Amer: >90 mL/min (ref >90)
Glucose, Bld: 94 mg/dL (ref 70–99)
Potassium: 3.9 mEq/L (ref 3.5–5.1)
Sodium: 143 mEq/L (ref 135–145)

## 2011-09-16 LAB — CBC
HCT: 37.8 % (ref 36.0–46.0)
Hemoglobin: 12.5 g/dL (ref 12.0–15.0)
MCH: 28.6 pg (ref 26.0–34.0)
RBC: 4.37 MIL/uL (ref 3.87–5.11)

## 2011-09-16 MED ORDER — FENTANYL CITRATE 0.05 MG/ML IJ SOLN
25.0000 ug | Freq: Once | INTRAMUSCULAR | Status: AC
  Administered 2011-09-16: 100 ug via INTRAVENOUS
  Filled 2011-09-16: qty 2

## 2011-09-16 MED ORDER — SODIUM CHLORIDE 0.9 % IV BOLUS (SEPSIS)
1000.0000 mL | Freq: Once | INTRAVENOUS | Status: AC
  Administered 2011-09-16: 1000 mL via INTRAVENOUS

## 2011-09-16 MED ORDER — ONDANSETRON HCL 4 MG/2ML IJ SOLN
4.0000 mg | Freq: Once | INTRAMUSCULAR | Status: AC
  Administered 2011-09-16: 4 mg via INTRAVENOUS
  Filled 2011-09-16: qty 2

## 2011-09-16 MED ORDER — PROCHLORPERAZINE EDISYLATE 5 MG/ML IJ SOLN
10.0000 mg | Freq: Four times a day (QID) | INTRAMUSCULAR | Status: DC | PRN
  Administered 2011-09-16: 10 mg via INTRAVENOUS
  Filled 2011-09-16: qty 2

## 2011-09-16 MED ORDER — DEXAMETHASONE SODIUM PHOSPHATE 10 MG/ML IJ SOLN
10.0000 mg | Freq: Once | INTRAMUSCULAR | Status: AC
  Administered 2011-09-16: 10 mg via INTRAVENOUS
  Filled 2011-09-16: qty 1

## 2011-09-16 MED ORDER — DIPHENHYDRAMINE HCL 50 MG/ML IJ SOLN
25.0000 mg | Freq: Once | INTRAMUSCULAR | Status: AC
  Administered 2011-09-16: 50 mg via INTRAVENOUS
  Filled 2011-09-16: qty 1

## 2011-09-16 NOTE — Discharge Instructions (Signed)
Syncope You have had a fainting (syncopal) spell. A fainting episode is a sudden, short-lived loss of consciousness. It results in complete recovery. It occurs because there has been a temporary shortage of oxygen and/or sugar (glucose) to the brain. CAUSES   Blood pressure pills and other medications that may lower blood pressure below normal. Sudden changes in posture (sudden standing).   Over-medication. Take your medications as directed.   Standing too long. This can cause blood to pool in the legs.   Seizure disorders.   Low blood sugar (hypoglycemia) of diabetes. This more commonly causes coma.   Bearing down to go to the bathroom. This can cause your blood pressure to rise suddenly. Your body compensates by making the blood pressure too low when you stop bearing down.   Hardening of the arteries where the brain temporarily does not receive enough blood.   Irregular heart beat and circulatory problems.   Fear, emotional distress, injury, sight of blood, or illness.  Your caregiver will send you home if the syncope was from non-worrisome causes (benign). Depending on your age and health, you may stay to be monitored and observed. If you return home, have someone stay with you if your caregiver feels that is desirable. It is very important to keep all follow-up referrals and appointments in order to properly manage this condition. This is a serious problem which can lead to serious illness and death if not carefully managed.  WARNING: Do not drive or operate machinery until your caregiver feels that it is safe for you to do so. SEEK IMMEDIATE MEDICAL CARE IF:   You have another fainting episode or faint while lying or sitting down. DO NOT DRIVE YOURSELF. Call 911 if no other help is available.   You have chest pain, are feeling sick to your stomach (nausea), vomiting or abdominal pain.   You have an irregular heartbeat or one that is very fast (pulse over 120 beats per minute).    You have a loss of feeling in some part of your body or lose movement in your arms or legs.   You have difficulty with speech, confusion, severe weakness, or visual problems.   You become sweaty and/or feel light headed.  Make sure you are rechecked as instructed. Document Released: 04/21/2005 Document Revised: 04/10/2011 Document Reviewed: 12/10/2006 [facility] Patient Information 2012 [place], [place].Headaches, Frequently Asked Questions MIGRAINE HEADACHES Q: What is migraine? What causes it? How can I treat it? A: Generally, migraine headaches begin as a dull ache. Then they develop into a constant, throbbing, and pulsating pain. You may experience pain at the temples. You may experience pain at the front or back of one or both sides of the head. The pain is usually accompanied by a combination of:  Nausea.   Vomiting.   Sensitivity to light and noise.  Some people (about 15%) experience an aura (see below) before an attack. The cause of migraine is believed to be chemical reactions in the brain. Treatment for migraine may include over-the-counter or prescription medications. It may also include self-help techniques. These include relaxation training and biofeedback.  Q: What is an aura? A: About 15% of people with migraine get an "aura". This is a sign of neurological symptoms that occur before a migraine headache. You may see wavy or jagged lines, dots, or flashing lights. You might experience tunnel vision or blind spots in one or both eyes. The aura can include visual or auditory hallucinations (something imagined). It may include disruptions in  smell (such as strange odors), taste or touch. Other symptoms include:  Numbness.   A "pins and needles" sensation.   Difficulty in recalling or speaking the correct word.  These neurological events may last as long as 60 minutes. These symptoms will fade as the headache begins. Q: What is a trigger? A: Certain physical or environmental  factors can lead to or "trigger" a migraine. These include:  Foods.   Hormonal changes.   Weather.   Stress.  It is important to remember that triggers are different for everyone. To help prevent migraine attacks, you need to figure out which triggers affect you. Keep a headache diary. This is a good way to track triggers. The diary will help you talk to your healthcare professional about your condition. Q: Does weather affect migraines? A: Bright sunshine, hot, humid conditions, and drastic changes in barometric pressure may lead to, or "trigger," a migraine attack in some people. But studies have shown that weather does not act as a trigger for everyone with migraines. Q: What is the link between migraine and hormones? A: Hormones start and regulate many of your body's functions. Hormones keep your body in balance within a constantly changing environment. The levels of hormones in your body are unbalanced at times. Examples are during menstruation, pregnancy, or menopause. That can lead to a migraine attack. In fact, about three quarters of all women with migraine report that their attacks are related to the menstrual cycle.  Q: Is there an increased risk of stroke for migraine sufferers? A: The likelihood of a migraine attack causing a stroke is very remote. That is not to say that migraine sufferers cannot have a stroke associated with their migraines. In persons under age 52, the most common associated factor for stroke is migraine headache. But over the course of a person's normal life span, the occurrence of migraine headache may actually be associated with a reduced risk of dying from cerebrovascular disease due to stroke.  Q: What are acute medications for migraine? A: Acute medications are used to treat the pain of the headache after it has started. Examples over-the-counter medications, NSAIDs, ergots, and triptans.  Q: What are the triptans? A: Triptans are the newest class of abortive  medications. They are specifically targeted to treat migraine. Triptans are vasoconstrictors. They moderate some chemical reactions in the brain. The triptans work on receptors in your brain. Triptans help to restore the balance of a neurotransmitter called serotonin. Fluctuations in levels of serotonin are thought to be a main cause of migraine.  Q: Are over-the-counter medications for migraine effective? A: Over-the-counter, or "OTC," medications may be effective in relieving mild to moderate pain and associated symptoms of migraine. But you should see your caregiver before beginning any treatment regimen for migraine.  Q: What are preventive medications for migraine? A: Preventive medications for migraine are sometimes referred to as "prophylactic" treatments. They are used to reduce the frequency, severity, and length of migraine attacks. Examples of preventive medications include antiepileptic medications, antidepressants, beta-blockers, calcium channel blockers, and NSAIDs (nonsteroidal anti-inflammatory drugs). Q: Why are anticonvulsants used to treat migraine? A: During the past few years, there has been an increased interest in antiepileptic drugs for the prevention of migraine. They are sometimes referred to as "anticonvulsants". Both epilepsy and migraine may be caused by similar reactions in the brain.  Q: Why are antidepressants used to treat migraine? A: Antidepressants are typically used to treat people with depression. They may reduce migraine frequency by  regulating chemical levels, such as serotonin, in the brain.  Q: What alternative therapies are used to treat migraine? A: The term "alternative therapies" is often used to describe treatments considered outside the scope of conventional Western medicine. Examples of alternative therapy include acupuncture, acupressure, and yoga. Another common alternative treatment is herbal therapy. Some herbs are believed to relieve headache pain.  Always discuss alternative therapies with your caregiver before proceeding. Some herbal products contain arsenic and other toxins. TENSION HEADACHES Q: What is a tension-type headache? What causes it? How can I treat it? A: Tension-type headaches occur randomly. They are often the result of temporary stress, anxiety, fatigue, or anger. Symptoms include soreness in your temples, a tightening band-like sensation around your head (a "vice-like" ache). Symptoms can also include a pulling feeling, pressure sensations, and contracting head and neck muscles. The headache begins in your forehead, temples, or the back of your head and neck. Treatment for tension-type headache may include over-the-counter or prescription medications. Treatment may also include self-help techniques such as relaxation training and biofeedback. CLUSTER HEADACHES Q: What is a cluster headache? What causes it? How can I treat it? A: Cluster headache gets its name because the attacks come in groups. The pain arrives with little, if any, warning. It is usually on one side of the head. A tearing or bloodshot eye and a runny nose on the same side of the headache may also accompany the pain. Cluster headaches are believed to be caused by chemical reactions in the brain. They have been described as the most severe and intense of any headache type. Treatment for cluster headache includes prescription medication and oxygen. SINUS HEADACHES Q: What is a sinus headache? What causes it? How can I treat it? A: When a cavity in the bones of the face and skull (a sinus) becomes inflamed, the inflammation will cause localized pain. This condition is usually the result of an allergic reaction, a tumor, or an infection. If your headache is caused by a sinus blockage, such as an infection, you will probably have a fever. An x-ray will confirm a sinus blockage. Your caregiver's treatment might include antibiotics for the infection, as well as antihistamines  or decongestants.  REBOUND HEADACHES Q: What is a rebound headache? What causes it? How can I treat it? A: A pattern of taking acute headache medications too often can lead to a condition known as "rebound headache." A pattern of taking too much headache medication includes taking it more than 2 days per week or in excessive amounts. That means more than the label or a caregiver advises. With rebound headaches, your medications not only stop relieving pain, they actually begin to cause headaches. Doctors treat rebound headache by tapering the medication that is being overused. Sometimes your caregiver will gradually substitute a different type of treatment or medication. Stopping may be a challenge. Regularly overusing a medication increases the potential for serious side effects. Consult a caregiver if you regularly use headache medications more than 2 days per week or more than the label advises. ADDITIONAL QUESTIONS AND ANSWERS Q: What is biofeedback? A: Biofeedback is a self-help treatment. Biofeedback uses special equipment to monitor your body's involuntary physical responses. Biofeedback monitors:  Breathing.   Pulse.   Heart rate.   Temperature.   Muscle tension.   Brain activity.  Biofeedback helps you refine and perfect your relaxation exercises. You learn to control the physical responses that are related to stress. Once the technique has been mastered, you do  not need the equipment any more. Q: Are headaches hereditary? A: Four out of five (80%) of people that suffer report a family history of migraine. Scientists are not sure if this is genetic or a family predisposition. Despite the uncertainty, a child has a 50% chance of having migraine if one parent suffers. The child has a 75% chance if both parents suffer.  Q: Can children get headaches? A: By the time they reach high school, most young people have experienced some type of headache. Many safe and effective approaches or  medications can prevent a headache from occurring or stop it after it has begun.  Q: What type of doctor should I see to diagnose and treat my headache? A: Start with your primary caregiver. Discuss his or her experience and approach to headaches. Discuss methods of classification, diagnosis, and treatment. Your caregiver may decide to recommend you to a headache specialist, depending upon your symptoms or other physical conditions. Having diabetes, allergies, etc., may require a more comprehensive and inclusive approach to your headache. The National Headache Foundation will provide, upon request, a list of [facility] physician members in your state. Document Released: 07/12/2003 Document Revised: 04/10/2011 Document Reviewed: 12/20/2007 [facility] Patient Information 2012 [place], [place].

## 2011-09-16 NOTE — ED Notes (Signed)
Pt resting with eyes closed.  Pt will not answer questions or open her eyes.  Husband at bedside, st's she had same type symptoms several yrs ago and was dx with anxiety and over medicated.  Also st's she was fine yesterday and worked today.  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

## 2011-09-16 NOTE — ED Notes (Signed)
Consent signed for Lumbar Puncture after procedure explained by Dr. Weldon Inches

## 2011-09-16 NOTE — ED Provider Notes (Addendum)
I saw and evaluated the patient, reviewed the resident's note and I agree with the findings and plan. Sudden onset of ha followed by syncope and tonic clonic activity.  Some somnolence afterward. Neuro exam otherwise nl.  Concern for [facility]. Ct neg. LP performed.  Waiting for results.  Pt feels better.   PROCEDURE Indication possible SAH COnsent obtained Pt positioned in lat decub. Landmarks identified.  1 % lido for analgesia 20 g needle inserted.  Blood return 2nd attempt with new needle successful.  Tube 1 bloody.  Tubes 2-4 clear. Csf sent for analysis.  Pt tolerated procedure well.  Cheri Guppy, MD 09/16/11 2332  Cheri Guppy, MD 09/17/11 1501  Cheri Guppy, MD 09/17/11 [phone]

## 2011-09-16 NOTE — ED Notes (Signed)
Pt c/o headache and is now vomiting.  Pt remains lethargic and sleepy but will arouse and answer questions appropriate Husband at bedside.  Skin warm and dry color appropriate.

## 2011-09-16 NOTE — ED Notes (Signed)
Pt to CT via stretcher

## 2011-09-16 NOTE — ED Provider Notes (Signed)
History     CSN: [id number]  Arrival date & time 09/16/11  1609   First MD Initiated Contact with Patient 09/16/11 1629      Chief Complaint  Patient presents with  . Loss of Consciousness    (Consider location/radiation/quality/duration/timing/severity/associated sxs/prior treatment) HPI Comments: Pt was working in the OR and had abrupt onset headache.  Felt nauseated.  Shortly felt lightheaded and lost consciousness.  Per OR staff afterwards pt had difficulty being aroused but slowly regained consciousness.  After this pt lost consciousness again and had generalized shaking per the OR staff.  In ED says she does not recall events.  C/o headache that is different from typical migraines.  Patient is a 46 y.o. female presenting with seizures. The history is provided by the patient.  Seizures  This is a recurrent problem. The current episode started 1 to 2 hours ago. The problem has been resolved. There was 1 (2) seizure. The most recent episode lasted less than 30 seconds. Pertinent negatives include no visual disturbance and no chest pain. Characteristics include eye blinking and loss of consciousness. Characteristics do not include bowel incontinence, bladder incontinence, rhythmic jerking or cyanosis. The episode was witnessed. The seizure(s) had no focality. There has been no fever.    History reviewed. No pertinent past medical history.  History reviewed. No pertinent past surgical history.  History reviewed. No pertinent family history.  History  Substance Use Topics  . Smoking status: Never Smoker   . Smokeless tobacco: Not on file  . Alcohol Use: No    OB History    Grav Para Term Preterm Abortions TAB SAB Ect Mult Living                  Review of Systems  Constitutional: Negative for activity change.  HENT: Negative for congestion.   Eyes: Negative for visual disturbance.  Respiratory: Negative for chest tightness and shortness of breath.   Cardiovascular:  Negative for chest pain, leg swelling and cyanosis.  Gastrointestinal: Negative for abdominal pain and bowel incontinence.  Genitourinary: Negative for bladder incontinence and dysuria.  Skin: Negative for rash.  Neurological: Positive for seizures and loss of consciousness. Negative for syncope.  Psychiatric/Behavioral: Negative for behavioral problems.    Allergies  Review of patient's allergies indicates no known allergies.  Home Medications   Current Outpatient Rx  Name Route Sig Dispense Refill  . ALPRAZOLAM 0.25 MG PO TABS Oral Take 0.25 mg by mouth daily as needed. For anxiety    . DIAZEPAM 10 MG PO TABS Oral Take 10 mg by mouth daily as needed. For anxiety.    . ESCITALOPRAM OXALATE 20 MG PO TABS Oral Take 20 mg by mouth at bedtime.    Marland Kitchen OVER THE COUNTER MEDICATION Oral Take 1 tablet by mouth daily. [employer] Supplement pack    . ZOLPIDEM TARTRATE 10 MG PO TABS Oral Take 10 mg by mouth at bedtime as needed. For sleep      BP 121/83  Pulse 56  Temp(Src) 98.8 F (37.1 C) (Oral)  Resp 19  SpO2 96%  Physical Exam  Constitutional: She is oriented to person, place, and time. She appears well-developed and well-nourished.  HENT:  Head: Normocephalic and atraumatic.  Eyes: Conjunctivae and EOM are normal. Pupils are equal, round, and reactive to light. No scleral icterus.  Neck: Normal range of motion. Neck supple.  Cardiovascular: Normal rate and regular rhythm.  Exam reveals no gallop and no friction rub.   No murmur  heard. Pulmonary/Chest: Effort normal and breath sounds normal. No respiratory distress. She has no wheezes. She has no rales. She exhibits no tenderness.  Abdominal: Soft. She exhibits no distension and no mass. There is no tenderness. There is no rebound and no guarding.  Musculoskeletal: Normal range of motion.  Neurological: She is alert and oriented to person, place, and time. She has normal reflexes. No cranial nerve deficit.       5/5 strength in all  extremities.  AAO  Skin: Skin is warm and dry. No rash noted.  Psychiatric: She has a normal mood and affect. Her behavior is normal. Judgment and thought content normal.    ED Course  Procedures (including critical care time)  EKG: NSR, no ST/T wave abnormality.   Labs Reviewed  CBC - Abnormal; Notable for the following:    WBC 10.8 (*)    All other components within normal limits  BASIC METABOLIC PANEL  POCT PREGNANCY, URINE  CSF CELL COUNT WITH DIFFERENTIAL  CSF CULTURE  GRAM STAIN  GLUCOSE, CSF  PROTEIN, CSF  CSF CELL COUNT WITH DIFFERENTIAL  PROTEIN AND GLUCOSE, CSF   Ct Head Wo Contrast  09/16/2011  *RADIOLOGY REPORT*  Clinical Data: Headache and vomiting.  Syncopal episode.  Previous seizures.  CT HEAD WITHOUT CONTRAST  Technique:  Contiguous axial images were obtained from the base of the skull through the vertex without contrast.  Comparison: 08/04/2007  Findings: There is no evidence of intracranial hemorrhage, brain edema or other signs of acute infarction.  There is no evidence of intracranial mass lesion or mass effect.  No abnormal extra-axial fluid collections are identified.  Ventricles are normal in size.  No other intracranial abnormality identified.  No skull abnormality demonstrated.  IMPRESSION: Negative noncontrast head CT.  Original Report Authenticated By: Danae Orleans, M.D.     1. Syncope   2. Headache       MDM  Pt was working in the OR and had abrupt onset headache.  Felt nauseated.  Shortly felt lightheaded and lost consciousness.  Per OR staff afterwards pt had difficulty being aroused but slowly regained consciousness.  After this pt lost consciousness again and had generalized shaking per the OR staff.  In ED says she does not recall events.  C/o headache that is different from typical migraines.  Unclear if pt had seizure or syncope PTA.  Well appearing in ED.  VSS.  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  Given headache prodrome  of acute onset have concern for [facility].  CT negative.  Will LP.  Treated with migraine cocktail.  Pt with improvement in ED.  EKG, screening labs unconcerning.   11:57 PM Care transferred to Dr. Patria Mane.  Awaiting CT adbomen.     Army Chaco, MD 09/16/11 [phone]

## 2011-09-16 NOTE — ED Notes (Addendum)
Pt in after syncopal episode at work, states she does not remember event, told staff at work prior to episode that she was having chest pain and her head hurt, pt then passed out and per staff she had some seizure like activity, pt continues to c/o headache and dizziness, denies chest pain at this time, pt with no history of seizures, CBG when checked at work was 122, pt with IV established PTA, 22g in left [facility]

## 2011-09-17 LAB — CSF CELL COUNT WITH DIFFERENTIAL
RBC Count, CSF: 2800 /mm3 — ABNORMAL HIGH
RBC Count, CSF: 7 /mm3 — ABNORMAL HIGH
Tube #: 4
WBC, CSF: 0 /mm3 (ref 0–5)

## 2011-09-17 LAB — GRAM STAIN

## 2011-09-17 NOTE — ED Provider Notes (Signed)
2:22 AM The patient feels much better at this time.  7 red blood cells in the fourth tube.  This is secondary to traumatic tap.  The patient is well-appearing.  Discharge home in good condition with close follow up with her primary care physician.  She's been instructed to return to the emergency department for new or worsening symptoms.  1. Syncope   2. Headache    Results for orders placed during the hospital encounter of 09/16/11  BASIC METABOLIC PANEL      Component Value Range   Sodium 143  135 - 145 (mEq/L)   Potassium 3.9  3.5 - 5.1 (mEq/L)   Chloride 110  96 - 112 (mEq/L)   CO2 24  19 - 32 (mEq/L)   Glucose, Bld 94  70 - 99 (mg/dL)   BUN 6  6 - 23 (mg/dL)   Creatinine, Ser 1.91  0.50 - 1.10 (mg/dL)   Calcium 9.4  8.4 - 47.8 (mg/dL)   GFR calc non Af Amer >90  >90 (mL/min)   GFR calc Af Amer >90  >90 (mL/min)  CBC      Component Value Range   WBC 10.8 (*) 4.0 - 10.5 (K/uL)   RBC 4.37  3.87 - 5.11 (MIL/uL)   Hemoglobin 12.5  12.0 - 15.0 (g/dL)   HCT 29.5  62.1 - 30.8 (%)   MCV 86.5  78.0 - 100.0 (fL)   MCH 28.6  26.0 - 34.0 (pg)   MCHC 33.1  30.0 - 36.0 (g/dL)   RDW 65.7  84.6 - 96.2 (%)   Platelets 245  150 - 400 (K/uL)  CSF CELL COUNT WITH DIFFERENTIAL      Component Value Range   Tube # 4     Color, CSF COLORLESS  COLORLESS    Appearance, CSF CLEAR (*) CLEAR    Supernatant NOT INDICATED     RBC Count, CSF 7 (*) 0 (/cu mm)   WBC, CSF 0  0 - 5 (/cu mm)   Segmented Neutrophils-CSF TOO FEW TO COUNT, SMEAR AVAILABLE FOR REVIEW  0 - 6 (%)   Other Cells, CSF RARE LYMPHOCYTES    GRAM STAIN      Component Value Range   Specimen Description CSF     Special Requests NONE     Gram Stain       Value: CYTOSPIN SLIDE     NO WBC SEEN     NO ORGANISMS SEEN   Report Status 09/17/2011 FINAL    GLUCOSE, CSF      Component Value Range   Glucose, CSF 56  43 - 76 (mg/dL)  PROTEIN, CSF      Component Value Range   Total  Protein, CSF 25  15 - 45 (mg/dL)  CSF CELL COUNT WITH  DIFFERENTIAL      Component Value Range   Tube # 1     Color, CSF PINK (*) COLORLESS    Appearance, CSF CLOUDY (*) CLEAR    Supernatant CLEAR     RBC Count, CSF 2800 (*) 0 (/cu mm)   WBC, CSF 4  0 - 5 (/cu mm)   Segmented Neutrophils-CSF TOO FEW TO COUNT, SMEAR AVAILABLE FOR REVIEW  0 - 6 (%)   Other Cells, CSF RARE NEUTROPHILS AND LYMPHOCYTES    POCT PREGNANCY, URINE      Component Value Range   Preg Test, Ur NEGATIVE  NEGATIVE    Ct Head Wo Contrast  09/16/2011  *RADIOLOGY REPORT*  Clinical Data: Headache and vomiting.  Syncopal episode.  Previous seizures.  CT HEAD WITHOUT CONTRAST  Technique:  Contiguous axial images were obtained from the base of the skull through the vertex without contrast.  Comparison: 08/04/2007  Findings: There is no evidence of intracranial hemorrhage, brain edema or other signs of acute infarction.  There is no evidence of intracranial mass lesion or mass effect.  No abnormal extra-axial fluid collections are identified.  Ventricles are normal in size.  No other intracranial abnormality identified.  No skull abnormality demonstrated.  IMPRESSION: Negative noncontrast head CT.  Original Report Authenticated By: Danae Orleans, M.D.    Lyanne Co, MD 09/17/11 Earle Gell

## 2011-09-17 NOTE — ED Provider Notes (Signed)
I saw and evaluated the patient, reviewed the resident's note and I agree with the findings and plan.  Cheri Guppy, MD 09/17/11 [phone]

## 2011-09-17 NOTE — ED Notes (Signed)
MD at bedside. 

## 2011-09-20 LAB — CSF CULTURE W GRAM STAIN: Culture: NO GROWTH

## 2012-05-26 ENCOUNTER — Other Ambulatory Visit: Payer: Self-pay | Admitting: Obstetrics and Gynecology

## 2012-05-26 DIAGNOSIS — R928 Other abnormal and inconclusive findings on diagnostic imaging of breast: Secondary | ICD-10-CM

## 2012-06-04 ENCOUNTER — Ambulatory Visit
Admission: RE | Admit: 2012-06-04 | Discharge: 2012-06-04 | Disposition: A | Discharge status: Home / Self Care | Payer: Federal, State, Local not specified - PPO | Source: Ambulatory Visit | Attending: Obstetrics and Gynecology | Admitting: Obstetrics and Gynecology

## 2012-06-04 DIAGNOSIS — R928 Other abnormal and inconclusive findings on diagnostic imaging of breast: Secondary | ICD-10-CM

## 2013-08-31 ENCOUNTER — Emergency Department (HOSPITAL_BASED_OUTPATIENT_CLINIC_OR_DEPARTMENT_OTHER): Payer: Federal, State, Local not specified - PPO

## 2013-08-31 ENCOUNTER — Encounter (HOSPITAL_BASED_OUTPATIENT_CLINIC_OR_DEPARTMENT_OTHER): Payer: Self-pay | Admitting: Emergency Medicine

## 2013-08-31 ENCOUNTER — Emergency Department (HOSPITAL_BASED_OUTPATIENT_CLINIC_OR_DEPARTMENT_OTHER)
Admission: EM | Admit: 2013-08-31 | Discharge: 2013-09-01 | Disposition: A | Discharge status: Home / Self Care | Payer: Federal, State, Local not specified - PPO | Attending: Emergency Medicine | Admitting: Emergency Medicine

## 2013-08-31 DIAGNOSIS — R5381 Other malaise: Secondary | ICD-10-CM | POA: Insufficient documentation

## 2013-08-31 DIAGNOSIS — R5383 Other fatigue: Principal | ICD-10-CM

## 2013-08-31 DIAGNOSIS — Z87820 Personal history of traumatic brain injury: Secondary | ICD-10-CM | POA: Insufficient documentation

## 2013-08-31 DIAGNOSIS — G43109 Migraine with aura, not intractable, without status migrainosus: Secondary | ICD-10-CM

## 2013-08-31 HISTORY — DX: Major depressive disorder, single episode, unspecified: F32.9

## 2013-08-31 HISTORY — DX: Unspecified injury of head, initial encounter: S09.90XA

## 2013-08-31 HISTORY — DX: Unspecified injury of head, sequela: S09.90XS

## 2013-08-31 HISTORY — DX: Unspecified injury of head, sequela: G44.309

## 2013-08-31 HISTORY — DX: Depression, unspecified: F32.A

## 2013-08-31 LAB — CBG MONITORING, ED: GLUCOSE-CAPILLARY: 76 mg/dL (ref 70–99)

## 2013-08-31 NOTE — ED Notes (Signed)
Husband reports left side weakness after taking valium and Ambien tonight.

## 2013-08-31 NOTE — Code Documentation (Signed)
48 yo wf transported vis [facility] from [facility] for sudden onset Rt side weakness & slurred speech.  Per report pt has had a migraine HA x 3 days that has been resistant to treatment.  Around 2100 tonight she took valium & Lorrin Mais and was found by her husband at 2150 to be drooling & weak on the right.  Pt was taken to [facility] where she was assessed, had a CT scan, & was transported to [facility] for further evaluation and work-up.  NIH 8 for drowsiness, Rt side weakness, slurred speech, & sensory deficit.  See code stroke flowsheet for times.  Awaiting MRI for treatment options.

## 2013-08-31 NOTE — ED Notes (Addendum)
[place] [facility] [place] as a code stroke transfer. At 21:00 took [place] and valium at 21:45 pt found to be drooling. Taken to [facility] and passed neuo screen at triage. Once inside room developed right sided weakness. Upon arrival unable to resist gravity with right arm.  Reports headaches for 3 days

## 2013-08-31 NOTE — Consult Note (Signed)
Neurology Consultation Reason for Consult: Right-sided weakness Referring Physician: Stark Jock, D  CC: Right-sided weakness  History is obtained from: Patient  HPI: Lindsay Hamilton is a 48 y.o. female with a history of migraines since a head injury 7 years ago has been experiencing a migraine for the past 3 days. She has gotten spots in her visual fields as well as experienced confusion and word finding difficulty with previous migraines, but has not had weakness in the past. Tonight she began having progressive weakness around 9:50 PM, her husband last saw her normal at 9:30 PM.   LKW: 9:30 PM tpa given?: no, not a stroke    ROS: A 14 point ROS was performed and is negative except as noted in the HPI.  Past Medical History  Diagnosis Date  . Head injury   . Depression   . Headaches due to old head injury     Family History: Sister - seizures multiple family members with stroke.   Social History: Tob: none  Exam: Current vital signs: BP 141/88  Pulse 89  Temp(Src) 97.9 F (36.6 C) (Oral)  Resp 16  Wt 88.451 kg (195 lb)  SpO2 97% Vital signs in last 24 hours: Temp:  [97.9 F (36.6 C)] 97.9 F (36.6 C) (04/29 2201) Pulse Rate:  [89] 89 (04/29 2201) Resp:  [16] 16 (04/29 2201) BP: (141)/(88) 141/88 mmHg (04/29 2201) SpO2:  [97 %] 97 % (04/29 2201) Weight:  [88.451 kg (195 lb)] 88.451 kg (195 lb) (04/29 2201)  General: in bed, NAD CV: RRR Mental Status: Patient is awake, alert, oriented to person, place, gets month wrong, age. Immediate and remote memory are intact. Patient is able to give a clear and coherent history. No signs of aphasia or neglect Cranial Nerves: II: Visual Fields are notable for possible right lower field cut. Pupils are equal, round, and reactive to light.  Discs are difficult to visualize. III,IV, VI: EOMI without ptosis or diploplia.  V: Facial sensation is decreased on right including to vibration 1 cm to the right of midline compared to  1cm to the left of midline.  VII: Facial movement is notable for mild right decreased movement with her holding right cheek tight as she puffs her cheeks.  VIII: hearing is intact to voice X: Uvula elevates symmetrically XI: Shoulder shrug is symmetric. XII: tongue is midline without atrophy or fasciculations.  Motor: Tone is normal. Bulk is normal. 5/5 strength was present on the left, she has marked give-way on the right side with initial good strength and then dropping to the table. The exam is markedly inconsistent.  Sensory: Sensation is decreased throughout right side.  Deep Tendon Reflexes: 2+ and symmetric in the biceps and patellae.  Cerebellar: FNF and HKS are intact on left.  Gait: Not assessed due to acute nature of evaluation and multiple medical monitors in ED setting.  I have reviewed labs in epic and the results pertinent to this consultation are: cbg 76.   I have reviewed the images obtained:CT head- negatvie  Impression: 48 year old female with multiple inconsistencies on her exam. I suspect that this is possibly psychogenic, though complicated migraine with some overlay is difficult to rule out. There is also a possibility of a small ischemic infarct with psychogenic overlay on top of it and therefore we will get an MRI to rule out acute stroke with embellishment.  Recommendations: 1) MRI brain, if positive will give t-PA 2) if negative would treat as complicated migraine.   Addison Lank  Leonel Ramsay, MD [facility] [phone]  If 7pm- 7am, please page neurology on call as listed in [place].

## 2013-08-31 NOTE — ED Provider Notes (Signed)
CSN: [id number]     Arrival date & time 08/31/13  2158 History  This chart was scribed for Veryl Speak, MD by Einar Pheasant, ED Scribe. This patient was seen in room MH05/MH05 and the patient's care was started at 10:17 PM.    Chief Complaint  Patient presents with  . Weakness   The history is provided by the patient and the spouse. No language interpreter was used.   HPI Comments: Lindsay Hamilton is a 48 y.o. female who presents to the Emergency Department complaining of weakness to the right side that started 30 minutes ago. Pt states that she was taking a walk with her husband when she started experiencing palpitations and drooping of the right side of her mouth. She states that once she got home she brushed her teeth and took Ambien, Lexapro, and Valium. Pt states that she normally takes them all together, but the valium is as needed. Husband states that pt experienced a brain injury about 3 years ago.     History reviewed. No pertinent past medical history. History reviewed. No pertinent past surgical history. No family history on file. History  Substance Use Topics  . Smoking status: Never Smoker   . Smokeless tobacco: Not on file  . Alcohol Use: No   OB History   Grav Para Term Preterm Abortions TAB SAB Ect Mult Living                 Review of Systems A complete 10 system review of systems was obtained and all systems are negative except as noted in the HPI and PMH.     Allergies  Review of patient's allergies indicates no known allergies.  Home Medications   Prior to Admission medications   Medication Sig Start Date End Date Taking? Authorizing Provider  ALPRAZolam (XANAX) 0.25 MG tablet Take 0.25 mg by mouth daily as needed. For anxiety    Historical Provider, MD  diazepam (VALIUM) 10 MG tablet Take 10 mg by mouth daily as needed. For anxiety.    Historical Provider, MD  escitalopram (LEXAPRO) 20 MG tablet Take 20 mg by mouth at bedtime.    Historical Provider,  MD  OVER THE COUNTER MEDICATION Take 1 tablet by mouth daily. [place] Supplement pack    Historical Provider, MD  zolpidem (AMBIEN) 10 MG tablet Take 10 mg by mouth at bedtime as needed. For sleep    Historical Provider, MD   BP 141/88  Pulse 89  Temp(Src) 97.9 F (36.6 C) (Oral)  Resp 16  Wt 195 lb (88.451 kg)  SpO2 97%  Physical Exam  Nursing note and vitals reviewed. Constitutional: She appears well-developed and well-nourished. No distress.  HENT:  Head: Normocephalic and atraumatic.  Eyes: Conjunctivae are normal. Right eye exhibits no discharge. Left eye exhibits no discharge.  Neck: Neck supple.  Cardiovascular: Normal rate, regular rhythm and normal heart sounds.  Exam reveals no gallop and no friction rub.   No murmur heard. Pulmonary/Chest: Effort normal and breath sounds normal. No respiratory distress.  Abdominal: Soft. She exhibits no distension. There is no tenderness.  Musculoskeletal: She exhibits no edema and no tenderness.  Neurological: She is alert.  There is 2/5 strength in the right upper and right lower extermities. There is a slight right sided facial droop noted. Speech is clear.   Skin: Skin is warm and dry.  Psychiatric: She has a normal mood and affect. Her behavior is normal. Thought content normal.    ED Course  Procedures (including critical care time)  DIAGNOSTIC STUDIES: Oxygen Saturation is 97% on RA, adequate by my interpretation.    COORDINATION OF CARE: 10:23 PM- Will order CT scan of head. Pt advised of plan for treatment and pt agrees.  Labs Review Labs Reviewed - No data to display  Imaging Review No results found.   EKG Interpretation None      MDM   Final diagnoses:  None    Patient is a 48 year old female who presents 30 minutes after the acute onset of right arm and leg weakness and facial numbness. She was triaged and taken to room 5 where the nurse immediately notified me of the situation. I immediately went to evaluate  the patient and was concerned about the possibility of stroke. She had a significant right arm and right leg deficit and days slight right-sided facial droop. Her speech was clear however.  A code stroke was initiated and I discussed the case with Dr. Leonel Ramsay. CT scan revealed no acute abnormality and EMS arrived here during our conversation to take the patient to [facility]. She will be transported there is a code stroke and for possible TPA.  I personally performed the services described in this documentation, which was scribed in my presence. The recorded information has been reviewed and is accurate.   CRITICAL CARE Performed by: Veryl Speak Total critical care time: 30 minutes Critical care time was exclusive of separately billable procedures and treating other patients. Critical care was necessary to treat or prevent imminent or life-threatening deterioration. Critical care was time spent personally by me on the following activities: development of treatment plan with patient and/or surrogate as well as nursing, discussions with consultants, evaluation of patient's response to treatment, examination of patient, obtaining history from patient or surrogate, ordering and performing treatments and interventions, ordering and review of laboratory studies, ordering and review of radiographic studies, pulse oximetry and re-evaluation of patient's condition.    Veryl Speak, MD 08/31/13 2303

## 2013-09-01 ENCOUNTER — Emergency Department (HOSPITAL_COMMUNITY): Payer: Federal, State, Local not specified - PPO

## 2013-09-01 DIAGNOSIS — G43109 Migraine with aura, not intractable, without status migrainosus: Secondary | ICD-10-CM

## 2013-09-01 LAB — PROTIME-INR
INR: 1 (ref 0.00–1.49)
Prothrombin Time: 13 seconds (ref 11.6–15.2)

## 2013-09-01 LAB — COMPREHENSIVE METABOLIC PANEL
ALT: 25 U/L (ref 0–35)
AST: 26 U/L (ref 0–37)
Albumin: 3.8 g/dL (ref 3.5–5.2)
Alkaline Phosphatase: 79 U/L (ref 39–117)
BUN: 15 mg/dL (ref 6–23)
CO2: 23 mEq/L (ref 19–32)
Calcium: 9 mg/dL (ref 8.4–10.5)
Chloride: 106 mEq/L (ref 96–112)
Creatinine, Ser: 0.5 mg/dL (ref 0.50–1.10)
GFR calc Af Amer: >90 mL/min (ref >90)
GFR calc non Af Amer: >90 mL/min (ref >90)
Glucose, Bld: 82 mg/dL (ref 70–99)
Potassium: 3.9 mEq/L (ref 3.7–5.3)
Sodium: 145 mEq/L (ref 137–147)
Total Protein: 7.6 g/dL (ref 6.0–8.3)

## 2013-09-01 LAB — CBC
HCT: 38.7 % (ref 36.0–46.0)
Hemoglobin: 12.6 g/dL (ref 12.0–15.0)
MCH: 28.9 pg (ref 26.0–34.0)
MCHC: 32.6 g/dL (ref 30.0–36.0)
MCV: 88.8 fL (ref 78.0–100.0)
Platelets: 220 10*3/uL (ref 150–400)
RBC: 4.36 MIL/uL (ref 3.87–5.11)
RDW: 14 % (ref 11.5–15.5)
WBC: 7.8 10*3/uL (ref 4.0–10.5)

## 2013-09-01 LAB — DIFFERENTIAL
Basophils Absolute: 0 10*3/uL (ref 0.0–0.1)
Basophils Relative: 0 % (ref 0–1)
Eosinophils Absolute: 0.2 10*3/uL (ref 0.0–0.7)
Eosinophils Relative: 2 % (ref 0–5)
Lymphocytes Relative: 50 % — ABNORMAL HIGH (ref 12–46)
Lymphs Abs: 3.9 10*3/uL (ref 0.7–4.0)
MONOS PCT: 4 % (ref 3–12)
Monocytes Absolute: 0.3 10*3/uL (ref 0.1–1.0)
NEUTROS PCT: 44 % (ref 43–77)
Neutro Abs: 3.4 10*3/uL (ref 1.7–7.7)

## 2013-09-01 LAB — I-STAT CHEM 8, ED
BUN: 16 mg/dL (ref 6–23)
CALCIUM ION: 1.17 mmol/L (ref 1.12–1.23)
CHLORIDE: 106 meq/L (ref 96–112)
Creatinine, Ser: 0.7 mg/dL (ref 0.50–1.10)
GLUCOSE: 85 mg/dL (ref 70–99)
HEMATOCRIT: 39 % (ref 36.0–46.0)
Hemoglobin: 13.3 g/dL (ref 12.0–15.0)
Potassium: 4 mEq/L (ref 3.7–5.3)
Sodium: 146 mEq/L (ref 137–147)
TCO2: 22 mmol/L (ref 0–100)

## 2013-09-01 LAB — I-STAT TROPONIN, ED: TROPONIN I, POC: 0 ng/mL (ref 0.00–0.08)

## 2013-09-01 LAB — APTT: aPTT: 32 seconds (ref 24–37)

## 2013-09-01 MED ORDER — ONDANSETRON HCL 4 MG/2ML IJ SOLN
INTRAMUSCULAR | Status: AC
  Filled 2013-09-01: qty 2

## 2013-09-01 MED ORDER — PROCHLORPERAZINE EDISYLATE 5 MG/ML IJ SOLN
10.0000 mg | Freq: Four times a day (QID) | INTRAMUSCULAR | Status: DC | PRN
Start: 2013-09-01 — End: 2013-09-01

## 2013-09-01 MED ORDER — MORPHINE SULFATE 4 MG/ML IJ SOLN
4.0000 mg | Freq: Once | INTRAMUSCULAR | Status: AC
  Administered 2013-09-01: 4 mg via INTRAVENOUS
  Filled 2013-09-01: qty 1

## 2013-09-01 MED ORDER — LORAZEPAM 2 MG/ML IJ SOLN
1.0000 mg | Freq: Once | INTRAMUSCULAR | Status: AC
  Administered 2013-09-01: 1 mg via INTRAVENOUS
  Filled 2013-09-01: qty 1

## 2013-09-01 MED ORDER — HYDROCODONE-ACETAMINOPHEN 5-325 MG PO TABS: 1.0000 | ORAL_TABLET | ORAL | Status: DC | PRN

## 2013-09-01 MED ORDER — ONDANSETRON HCL 4 MG/2ML IJ SOLN
4.0000 mg | Freq: Once | INTRAMUSCULAR | Status: AC
  Administered 2013-09-01: 4 mg via INTRAVENOUS

## 2013-09-01 MED ORDER — KETOROLAC TROMETHAMINE 30 MG/ML IJ SOLN
30.0000 mg | Freq: Once | INTRAMUSCULAR | Status: AC
  Administered 2013-09-01: 30 mg via INTRAVENOUS
  Filled 2013-09-01: qty 1

## 2013-09-01 NOTE — ED Provider Notes (Signed)
CSN: [id number]     Arrival date & time 08/31/13  2158 History   First MD Initiated Contact with Patient 08/31/13 2216     Chief Complaint  Patient presents with  . Code Stroke    An emergency department physician performed an initial assessment on this suspected stroke patient at 2224.  HPI  Patient seen  upon arrival as a possible "code stroke". Seen with Dr. Kathrynn Speed.  Causes weakness of her right side, right arm greater than right leg approximately 30 minutes prior to arrival at that point. Symptoms starting approximately 20 1:50 PM. She felt palpitations. She felt the right side of her mouth was drooping. Traumatic brain injury. No history of stroke. History of depression and anxiety. Complaint of headache. No fever. No head or neck injuries.  Past Medical History  Diagnosis Date  . Head injury   . Depression   . Headaches due to old head injury    History reviewed. No pertinent past surgical history. History reviewed. No pertinent family history. History  Substance Use Topics  . Smoking status: Never Smoker   . Smokeless tobacco: Not on file  . Alcohol Use: No   OB History   Grav Para Term Preterm Abortions TAB SAB Ect Mult Living                 Review of Systems  Constitutional: Negative for fever, chills, diaphoresis, appetite change and fatigue.  HENT: Negative for mouth sores, sore throat and trouble swallowing.   Eyes: Negative for visual disturbance.  Respiratory: Negative for cough, chest tightness, shortness of breath and wheezing.   Cardiovascular: Negative for chest pain.  Gastrointestinal: Negative for nausea, vomiting, abdominal pain, diarrhea and abdominal distention.  Endocrine: Negative for polydipsia, polyphagia and polyuria.  Genitourinary: Negative for dysuria, frequency and hematuria.  Musculoskeletal: Negative for gait problem.  Skin: Negative for color change, pallor and rash.  Neurological: Positive for weakness and headaches. Negative  for dizziness, syncope and light-headedness.  Hematological: Does not bruise/bleed easily.  Psychiatric/Behavioral: Negative for behavioral problems and confusion.      Allergies  Review of patient's allergies indicates no known allergies.  Home Medications   Prior to Admission medications   Medication Sig Start Date End Date Taking? Authorizing Provider  diazepam (VALIUM) 10 MG tablet Take 10 mg by mouth daily as needed for sleep. For anxiety.   Yes Historical Provider, MD  escitalopram (LEXAPRO) 20 MG tablet Take 20 mg by mouth at bedtime.   Yes Historical Provider, MD  ibuprofen (ADVIL,MOTRIN) 100 MG tablet Take 400 mg by mouth every 6 (six) hours as needed for pain or fever.   Yes Historical Provider, MD  OVER THE COUNTER MEDICATION Take 1 tablet by mouth daily. [place] Supplement pack   Yes Historical Provider, MD  zolpidem (AMBIEN) 10 MG tablet Take 10 mg by mouth at bedtime as needed for sleep. For sleep   Yes Historical Provider, MD   BP 141/88  Pulse 89  Temp(Src) 97.9 F (36.6 C) (Oral)  Resp 16  Wt 195 lb (88.451 kg)  SpO2 97% Physical Exam  Constitutional: She is oriented to person, place, and time. She appears well-developed and well-nourished. No distress.  HENT:  Head: Normocephalic.  Eyes: Conjunctivae are normal. Pupils are equal, round, and reactive to light. No scleral icterus.  Neck: Normal range of motion. Neck supple. No thyromegaly present.  Cardiovascular: Normal rate and regular rhythm.  Exam reveals no gallop and no friction rub.  No murmur heard. Pulmonary/Chest: Effort normal and breath sounds normal. No respiratory distress. She has no wheezes. She has no rales.  Abdominal: Soft. Bowel sounds are normal. She exhibits no distension. There is no tenderness. There is no rebound.  Musculoskeletal: Normal range of motion.  Neurological: She is alert and oriented to person, place, and time.  Patient is slow to respond. She is appropriate to repeat words  without difficulty. No obvious facial droop. No bvious cranial nerve deficits. No visual field deficits. Patient has normal strength in left upper and left lower extremity. Pt can lift her right leg without difficulty. She is inconsistent in "giveaway weakness" of her right upper extremity.  Skin: Skin is warm and dry. No rash noted.  Psychiatric: She has a normal mood and affect. Her behavior is normal.    ED Course  Procedures (including critical care time) Labs Review Labs Reviewed  DIFFERENTIAL - Abnormal; Notable for the following:    Lymphocytes Relative 50 (*)    All other components within normal limits  COMPREHENSIVE METABOLIC PANEL - Abnormal; Notable for the following:    Total Bilirubin <0.2 (*)    All other components within normal limits  PROTIME-INR  APTT  CBC  CBG MONITORING, ED  I-STAT TROPOININ, ED  I-STAT CHEM 8, ED    Imaging Review Ct Head Wo Contrast  08/31/2013   CLINICAL DATA:  Code stroke ; right-sided weakness  EXAM: CT HEAD WITHOUT CONTRAST  TECHNIQUE: Contiguous axial images were obtained from the base of the skull through the vertex without intravenous contrast.  COMPARISON:  CT HEAD W/O CM dated 09/16/2011  FINDINGS: The ventricles are normal in size and position. There is no intracranial hemorrhage nor intracranial mass effect. There is no evidence of an evolving ischemic infarction. The cerebellum and brainstem are normal in density.  At bone window settings the observed portions of the paranasal sinuses and mastoid air cells are clear. There is no evidence of an acute skull fracture.  IMPRESSION: There is no evidence of an acute ischemic or hemorrhagic event. There is no intracranial mass effect or hydrocephalus.  These results were called by telephone at the time of interpretation on 08/31/2013 at 10:35 PM to Woodward Ku, RN, who verbally acknowledged these results.   Electronically Signed   By: David  Martinique   On: 08/31/2013 22:36   Mr Brain Wo  Contrast  09/01/2013   CLINICAL DATA:  Stroke  EXAM: MRI HEAD WITHOUT CONTRAST  TECHNIQUE: Multiplanar, multiecho pulse sequences of the brain and surrounding structures were obtained without intravenous contrast.  COMPARISON:  Prior CT performed on 08/31/2013 and MRI from 08/05/2007. Marland Kitchen  FINDINGS: The CSF containing spaces are within normal limits for patient age. No focal parenchymal signal abnormality is identified. No mass lesion, midline shift, or extra-axial fluid collection. Ventricles are normal in size without evidence of hydrocephalus.  No diffusion-weighted signal abnormality is identified to suggest acute intracranial infarct. Gray-white matter differentiation is maintained. Normal flow voids are seen within the intracranial vasculature. No intracranial hemorrhage identified.  The cervicomedullary junction is normal. Pituitary gland is within normal limits. Pituitary stalk is midline. The globes and optic nerves demonstrate a normal appearance with normal signal intensity. The  The bone marrow signal intensity is normal. Calvarium is intact. Visualized upper cervical spine is within normal limits.  Scalp soft tissues are unremarkable.  Paranasal sinuses are clear.  No mastoid effusion.  IMPRESSION: Normal MRI of the brain with no acute intracranial infarct or other  abnormality identified.  Results were discussed by telephone at the time of interpretation on 09/01/2013 at 1:09 AM to Dr. Roland Rack , who verbally acknowledged these results.   Electronically Signed   By: Jeannine Boga M.D.   On: 09/01/2013 01:09     EKG Interpretation None      MDM   Final diagnoses:  Acute CVA (cerebrovascular accident)    CT normal.  MRI Normal.  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  Plan will be DC from ED.    Tanna Furry, MD 09/01/13 [phone]

## 2013-09-01 NOTE — ED Notes (Signed)
MD Campos at bedside.  

## 2013-09-01 NOTE — Discharge Instructions (Signed)
Migraine Headache A migraine headache is an intense, throbbing pain on one or both sides of your head. A migraine can last for 30 minutes to several hours. CAUSES  The exact cause of a migraine headache is not always known. However, a migraine may be caused when nerves in the brain become irritated and release chemicals that cause inflammation. This causes pain. Certain things may also trigger migraines, such as:  Alcohol.  Smoking.  Stress.  Menstruation.  Aged cheeses.  Foods or drinks that contain nitrates, glutamate, aspartame, or tyramine.  Lack of sleep.  Chocolate.  Caffeine.  Hunger.  Physical exertion.  Fatigue.  Medicines used to treat chest pain (nitroglycerine), birth control pills, estrogen, and some blood pressure medicines. SIGNS AND SYMPTOMS  Pain on one or both sides of your head.  Pulsating or throbbing pain.  Severe pain that prevents daily activities.  Pain that is aggravated by any physical activity.  Nausea, vomiting, or both.  Dizziness.  Pain with exposure to bright lights, loud noises, or activity.  General sensitivity to bright lights, loud noises, or smells. Before you get a migraine, you may get warning signs that a migraine is coming (aura). An aura may include:  Seeing flashing lights.  Seeing bright spots, halos, or zig-zag lines.  Having tunnel vision or blurred vision.  Having feelings of numbness or tingling.  Having trouble talking.  Having muscle weakness. DIAGNOSIS  A migraine headache is often diagnosed based on:  Symptoms.  Physical exam.  A CT scan or MRI of your head. These imaging tests cannot diagnose migraines, but they can help rule out other causes of headaches. TREATMENT Medicines may be given for pain and nausea. Medicines can also be given to help prevent recurrent migraines.  HOME CARE INSTRUCTIONS  Only take over-the-counter or prescription medicines for pain or discomfort as directed by your  health care provider. The use of long-term narcotics is not recommended.  Lie down in a dark, quiet room when you have a migraine.  Keep a journal to find out what may trigger your migraine headaches. For example, write down:  What you eat and drink.  How much sleep you get.  Any change to your diet or medicines.  Limit alcohol consumption.  Quit smoking if you smoke.  Get 7 9 hours of sleep, or as recommended by your health care provider.  Limit stress.  Keep lights dim if bright lights bother you and make your migraines worse. SEEK IMMEDIATE MEDICAL CARE IF:   Your migraine becomes severe.  You have a fever.  You have a stiff neck.  You have vision loss.  You have muscular weakness or loss of muscle control.  You start losing your balance or have trouble walking.  You feel faint or pass out.  You have severe symptoms that are different from your first symptoms. MAKE SURE YOU:   Understand these instructions.  Will watch your condition.  Will get help right away if you are not doing well or get worse. Document Released: 04/21/2005 Document Revised: 02/09/2013 Document Reviewed: 12/27/2012 Exit[employer] Patient Information 2014 [employer].  

## 2013-09-01 NOTE — ED Notes (Signed)
Patient continues to complain of abdominal cramping. Now states she feels as though she cannot breath. MD [facility] aware and order for Ativan given. Patient appears anxious.

## 2013-09-01 NOTE — ED Provider Notes (Signed)
3:09 AM Pt feels much better.  Weakness is resolved.  Outpatient followup with PCP and neurology.  She understands to return to the ER for new or worsening symptoms  Ct Head Wo Contrast  08/31/2013   CLINICAL DATA:  Code stroke ; right-sided weakness  EXAM: CT HEAD WITHOUT CONTRAST  TECHNIQUE: Contiguous axial images were obtained from the base of the skull through the vertex without intravenous contrast.  COMPARISON:  CT HEAD W/O CM dated 09/16/2011  FINDINGS: The ventricles are normal in size and position. There is no intracranial hemorrhage nor intracranial mass effect. There is no evidence of an evolving ischemic infarction. The cerebellum and brainstem are normal in density.  At bone window settings the observed portions of the paranasal sinuses and mastoid air cells are clear. There is no evidence of an acute skull fracture.  IMPRESSION: There is no evidence of an acute ischemic or hemorrhagic event. There is no intracranial mass effect or hydrocephalus.  These results were called by telephone at the time of interpretation on 08/31/2013 at 10:35 PM to Woodward Ku, RN, who verbally acknowledged these results.   Electronically Signed   By: David  Martinique   On: 08/31/2013 22:36   Mr Brain Wo Contrast  09/01/2013   CLINICAL DATA:  Stroke  EXAM: MRI HEAD WITHOUT CONTRAST  TECHNIQUE: Multiplanar, multiecho pulse sequences of the brain and surrounding structures were obtained without intravenous contrast.  COMPARISON:  Prior CT performed on 08/31/2013 and MRI from 08/05/2007. Marland Kitchen  FINDINGS: The CSF containing spaces are within normal limits for patient age. No focal parenchymal signal abnormality is identified. No mass lesion, midline shift, or extra-axial fluid collection. Ventricles are normal in size without evidence of hydrocephalus.  No diffusion-weighted signal abnormality is identified to suggest acute intracranial infarct. Gray-white matter differentiation is maintained. Normal flow voids are seen within  the intracranial vasculature. No intracranial hemorrhage identified.  The cervicomedullary junction is normal. Pituitary gland is within normal limits. Pituitary stalk is midline. The globes and optic nerves demonstrate a normal appearance with normal signal intensity. The  The bone marrow signal intensity is normal. Calvarium is intact. Visualized upper cervical spine is within normal limits.  Scalp soft tissues are unremarkable.  Paranasal sinuses are clear.  No mastoid effusion.  IMPRESSION: Normal MRI of the brain with no acute intracranial infarct or other abnormality identified.  Results were discussed by telephone at the time of interpretation on 09/01/2013 at 1:09 AM to Dr. Roland Rack , who verbally acknowledged these results.   Electronically Signed   By: Jeannine Boga M.D.   On: 09/01/2013 01:09  I personally reviewed the imaging tests through PACS system I reviewed available ER/hospitalization records through the [place], MD 09/01/13 [phone]

## 2013-09-01 NOTE — ED Notes (Signed)
Pt actively vomiting, MD aware. See new orders.

## 2013-09-01 NOTE — ED Notes (Addendum)
Zofran initially relieved nausea and abdominal cramping, but it has returned. MD aware but currently involved in procedure.

## 2013-09-12 ENCOUNTER — Encounter: Payer: Self-pay | Admitting: *Deleted

## 2013-09-14 ENCOUNTER — Ambulatory Visit (INDEPENDENT_AMBULATORY_CARE_PROVIDER_SITE_OTHER): Payer: Federal, State, Local not specified - PPO | Admitting: Neurology

## 2013-09-14 ENCOUNTER — Encounter: Payer: Self-pay | Admitting: Neurology

## 2013-09-14 VITALS — BP 107/75 | HR 91 | Ht 66.0 in | Wt 198.0 lb

## 2013-09-14 DIAGNOSIS — G4700 Insomnia, unspecified: Secondary | ICD-10-CM

## 2013-09-14 DIAGNOSIS — G43009 Migraine without aura, not intractable, without status migrainosus: Secondary | ICD-10-CM

## 2013-09-14 HISTORY — DX: Insomnia, unspecified: G47.00

## 2013-09-14 HISTORY — DX: Migraine without aura, not intractable, without status migrainosus: G43.009

## 2013-09-14 MED ORDER — TOPIRAMATE 25 MG PO TABS: ORAL_TABLET | ORAL | Status: DC

## 2013-09-14 MED ORDER — RIZATRIPTAN BENZOATE 10 MG PO TABS: 10.0000 mg | ORAL_TABLET | Freq: Three times a day (TID) | ORAL | Status: DC | PRN

## 2013-09-14 NOTE — Progress Notes (Signed)
Reason for visit: Headache  Lindsay Hamilton is a 48 y.o. female  History of present illness:  Lindsay Hamilton is a 48 year old right-handed white female with a history of headaches. 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 The patient went to the emergency room recently with a severe headache for 3 days associated with onset of some problems with speech, right-sided weakness and numbness. The patient also had some blurred vision. MRI evaluation of the brain was unremarkable. 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 She has had episodes of right temporal headaches that have occurred since that time, occurring once or twice a week. The episodes may last up to 24 hours with the headache. She generally will have some blurring of vision, and slurred speech with her headache. Bright lights and weather changes will bring on headaches. The headaches are associated with a pressure sensation in the frontotemporal area on the right. She generally does not get left-sided headaches. She has been taking diazepam 10 mg for her headaches with modest benefit. She indicates that her half sister also has migraine headache. She has a long-standing history of chronic insomnia. She comes to this office for an evaluation.  Past Medical History  Diagnosis Date  . Head injury   . Depression   . Headaches due to old head injury   . Migraine without aura, without mention of intractable migraine without mention of status migrainosus 09/14/2013  . Insomnia 09/14/2013  . Pseudoseizures   . Obese     Past Surgical History  Procedure Laterality Date  . Shoulder surgery      right arthroscopy, left  . Cholecystectomy    . Cesarean section    . Knee arthroscopy       bilateral    Family History  Problem Relation Age of Onset  . Hypertension Mother   . Cancer - Prostate Father   . Seizures Sister   . Migraines Sister   . Hypertension Brother   . Hypertension Brother     Social history:  reports that she has quit smoking. She has never used smokeless tobacco. She reports that she drinks alcohol. She reports that she does not use illicit drugs.  Medications:  Current Outpatient Prescriptions on File Prior to Visit  Medication Sig Dispense Refill  . diazepam (VALIUM) 10 MG tablet Take 10 mg by mouth daily as needed for sleep. For anxiety.      Marland Kitchen escitalopram (LEXAPRO) 20 MG tablet Take 20 mg by mouth at bedtime.      Marland Kitchen HYDROcodone-acetaminophen (NORCO/VICODIN) 5-325 MG per tablet Take 1 tablet by mouth every 4 (four) hours as needed for moderate pain.  12 tablet  0  . OVER THE COUNTER MEDICATION Take 1 tablet by mouth daily. [place] Supplement pack      . zolpidem (AMBIEN) 10 MG tablet Take 10 mg by mouth at bedtime as needed for sleep. For sleep       No current facility-administered medications on file prior to visit.     No Known Allergies  ROS:  Out of a complete 14 system review of symptoms, the patient complains only of the following symptoms, and all other reviewed systems are negative.  Weight gain, fatigue Feeling  hot Memory loss, headache, slurred speech, dizziness, seizures, passing out Anxiety, not enough sleep, decreased energy Insomnia  Blood pressure 107/75, pulse 91, height 5\' 6"  (1.676 m), weight 198 lb (89.812 kg).  Physical Exam  General: The patient is alert and cooperative at the time of the examination. The patient is moderately obese. Affect is flat.  Eyes: Pupils are equal, round, and reactive to light. Discs are flat bilaterally.  Neck: The neck is supple, no carotid bruits are noted.  Respiratory: The respiratory examination is clear.  Cardiovascular: The cardiovascular examination reveals a regular rate  and rhythm, no obvious murmurs or rubs are noted.  Neuromuscular: Range of movement of the cervical spine is full. No crepitus is noted in the temporomandibular joints.  Skin: Extremities are without significant edema.  Neurologic Exam  Mental status: The patient is alert and oriented x 3 at the time of the examination. The patient has apparent normal recent and remote memory, with an apparently normal attention span and concentration ability.  Cranial nerves: Facial symmetry is present. There is good sensation of the face to pinprick and soft touch bilaterally. The strength of the facial muscles and the muscles to head turning and shoulder shrug are normal bilaterally. Speech is well enunciated, no aphasia or dysarthria is noted. Extraocular movements are full. Visual fields are full. The tongue is midline, and the patient has symmetric elevation of the soft palate. No obvious hearing deficits are noted.  Motor: The motor testing reveals 5 over 5 strength of all 4 extremities. Good symmetric motor tone is noted throughout.  Sensory: Sensory testing is intact to pinprick, soft touch, vibration sensation, and position sense on all 4 extremities, with exception that pinprick sensation is decreased on the right arm and right leg relative to the left. No evidence of extinction is noted.  Coordination: Cerebellar testing reveals good finger-nose-finger and heel-to-shin bilaterally.  Gait and station: Gait is normal. Tandem gait is normal. Romberg is negative. No drift is seen.  Reflexes: Deep tendon reflexes are symmetric and normal bilaterally. Toes are downgoing bilaterally.    MRI brain 08/31/13:  IMPRESSION:  Normal MRI of the brain with no acute intracranial infarct or other  abnormality identified.    Assessment/Plan:  1. Migraine headache  2. History of pseudoseizures  3. Anxiety and depression  The patient has an unremarkable objective neurologic examination today. She will  be placed on Topamax as a prophylactic medication for her migraine. Maxalt will be used as a rescue drug. She has hydrocodone to take as well if needed for the headache. She will followup through this office in 4 months. She will contact our office if the medications are not tolerated or are ineffective.  Jill Alexanders MD 09/14/2013 8:24 PM  [facility] [address] [place], [place] [address]  Phone [phone] Fax [phone]

## 2013-09-14 NOTE — Patient Instructions (Signed)
   Topamax (topiramate) is a seizure medication that has an FDA approval for seizures and for migraine headache. Potential side effects of this medication include weight loss, cognitive slowing, tingling in the fingers and toes, and carbonated drinks will taste bad. If any significant side effects are noted on this drug, please contact our office.   Migraine Headache A migraine headache is an intense, throbbing pain on one or both sides of your head. A migraine can last for 30 minutes to several hours. CAUSES  The exact cause of a migraine headache is not always known. However, a migraine may be caused when nerves in the brain become irritated and release chemicals that cause inflammation. This causes pain. Certain things may also trigger migraines, such as:  Alcohol.  Smoking.  Stress.  Menstruation.  Aged cheeses.  Foods or drinks that contain nitrates, glutamate, aspartame, or tyramine.  Lack of sleep.  Chocolate.  Caffeine.  Hunger.  Physical exertion.  Fatigue.  Medicines used to treat chest pain (nitroglycerine), birth control pills, estrogen, and some blood pressure medicines. SIGNS AND SYMPTOMS  Pain on one or both sides of your head.  Pulsating or throbbing pain.  Severe pain that prevents daily activities.  Pain that is aggravated by any physical activity.  Nausea, vomiting, or both.  Dizziness.  Pain with exposure to bright lights, loud noises, or activity.  General sensitivity to bright lights, loud noises, or smells. Before you get a migraine, you may get warning signs that a migraine is coming (aura). An aura may include:  Seeing flashing lights.  Seeing bright spots, halos, or zig-zag lines.  Having tunnel vision or blurred vision.  Having feelings of numbness or tingling.  Having trouble talking.  Having muscle weakness. DIAGNOSIS  A migraine headache is often diagnosed based on:  Symptoms.  Physical exam.  A CT scan or MRI of  your head. These imaging tests cannot diagnose migraines, but they can help rule out other causes of headaches. TREATMENT Medicines may be given for pain and nausea. Medicines can also be given to help prevent recurrent migraines.  HOME CARE INSTRUCTIONS  Only take over-the-counter or prescription medicines for pain or discomfort as directed by your health care provider. The use of long-term narcotics is not recommended.  Lie down in a dark, quiet room when you have a migraine.  Keep a journal to find out what may trigger your migraine headaches. For example, write down:  What you eat and drink.  How much sleep you get.  Any change to your diet or medicines.  Limit alcohol consumption.  Quit smoking if you smoke.  Get 7 9 hours of sleep, or as recommended by your health care provider.  Limit stress.  Keep lights dim if bright lights bother you and make your migraines worse. SEEK IMMEDIATE MEDICAL CARE IF:   Your migraine becomes severe.  You have a fever.  You have a stiff neck.  You have vision loss.  You have muscular weakness or loss of muscle control.  You start losing your balance or have trouble walking.  You feel faint or pass out.  You have severe symptoms that are different from your first symptoms. MAKE SURE YOU:   Understand these instructions.  Will watch your condition.  Will get help right away if you are not doing well or get worse. Document Released: 04/21/2005 Document Revised: 02/09/2013 Document Reviewed: 12/27/2012 [facility] Patient Information 2014 [place].

## 2013-10-06 ENCOUNTER — Telehealth: Payer: Self-pay | Admitting: Neurology

## 2013-10-06 MED ORDER — PROPRANOLOL HCL 20 MG PO TABS: 20.0000 mg | ORAL_TABLET | Freq: Two times a day (BID) | ORAL | Status: DC

## 2013-10-06 MED ORDER — ISOMETHEPTENE-APAP-DICHLORAL 65-325-100 MG PO CAPS: 1.0000 | ORAL_CAPSULE | Freq: Four times a day (QID) | ORAL | Status: DC | PRN

## 2013-10-06 NOTE — Telephone Encounter (Signed)
Called patient. The patient indicates that the Topamax is increase in headaches. We will switch to propranolol, and take Midrin if needed for the headache. She indicates that the Maxalt is not helpful.

## 2013-10-06 NOTE — Telephone Encounter (Signed)
Patient calling to state that she would like to go back to taking Topamax 1 per day, taking it twice daily makes her feel "blah."  Maxalt is not working for her headaches and she would like to try something new. It is okay to leave a message on her answering machine. Please call to advise.

## 2013-10-06 NOTE — Telephone Encounter (Signed)
Spoke with patient and she said that the maxalt is not helping with the headaches, topamax twice daily makes the headaches come more frequentand they did not stop, has no energy and she has a feeling of "blah." requesting something else

## 2014-01-06 ENCOUNTER — Ambulatory Visit: Payer: Federal, State, Local not specified - PPO | Admitting: Adult Health

## 2014-01-12 ENCOUNTER — Ambulatory Visit: Payer: Self-pay | Admitting: Adult Health

## 2014-01-19 ENCOUNTER — Encounter: Payer: Self-pay | Admitting: Adult Health

## 2014-08-01 ENCOUNTER — Other Ambulatory Visit: Payer: Self-pay

## 2014-08-01 DIAGNOSIS — Z1231 Encounter for screening mammogram for malignant neoplasm of breast: Secondary | ICD-10-CM

## 2014-08-08 ENCOUNTER — Other Ambulatory Visit: Payer: Self-pay

## 2014-08-09 ENCOUNTER — Ambulatory Visit
Admission: RE | Admit: 2014-08-09 | Discharge: 2014-08-09 | Disposition: A | Discharge status: Home / Self Care | Payer: Federal, State, Local not specified - PPO | Source: Ambulatory Visit

## 2014-08-09 DIAGNOSIS — Z1231 Encounter for screening mammogram for malignant neoplasm of breast: Secondary | ICD-10-CM

## 2014-08-09 LAB — CYTOLOGY - PAP

## 2014-08-17 ENCOUNTER — Ambulatory Visit (HOSPITAL_COMMUNITY): Payer: Federal, State, Local not specified - PPO | Attending: Cardiology | Admitting: Radiology

## 2014-08-17 ENCOUNTER — Other Ambulatory Visit (HOSPITAL_COMMUNITY): Payer: Self-pay | Admitting: Family Medicine

## 2014-08-17 DIAGNOSIS — R011 Cardiac murmur, unspecified: Secondary | ICD-10-CM | POA: Diagnosis not present

## 2014-08-17 NOTE — Progress Notes (Signed)
Echocardiogram performed.  

## 2014-09-20 ENCOUNTER — Ambulatory Visit: Payer: Federal, State, Local not specified - PPO | Admitting: Cardiology

## 2014-11-01 ENCOUNTER — Encounter: Payer: Self-pay | Admitting: Cardiology

## 2014-11-01 ENCOUNTER — Ambulatory Visit (INDEPENDENT_AMBULATORY_CARE_PROVIDER_SITE_OTHER): Payer: Federal, State, Local not specified - PPO | Admitting: Cardiology

## 2014-11-01 VITALS — BP 130/84 | HR 70 | Ht 66.0 in | Wt 200.0 lb

## 2014-11-01 DIAGNOSIS — G43001 Migraine without aura, not intractable, with status migrainosus: Secondary | ICD-10-CM | POA: Diagnosis not present

## 2014-11-01 DIAGNOSIS — R011 Cardiac murmur, unspecified: Secondary | ICD-10-CM | POA: Diagnosis not present

## 2014-11-01 NOTE — Assessment & Plan Note (Signed)
Patient may have a very soft 1/6 systolic ejection murmur on examination. Most likely a flow murmur. Echocardiogram shows no valvular abnormalities. No cardiac symptoms. No further cardiac workup indicated.

## 2014-11-01 NOTE — Progress Notes (Signed)
     HPI: 49 year old female for evaluation of murmur. No prior cardiac history. Echocardiogram April 2016 showed normal LV function and grade 1 diastolic dysfunction. No significant valvular abnormality. Patient denies dyspnea, chest pain, palpitations or syncope. No orthopnea, PND or pedal edema.  Current Outpatient Prescriptions  Medication Sig Dispense Refill  . ALPRAZolam (XANAX) 0.25 MG tablet Take 0.25 mg by mouth as needed for anxiety.     Marland Kitchen escitalopram (LEXAPRO) 20 MG tablet Take 20 mg by mouth at bedtime.    Marland Kitchen zolpidem (AMBIEN) 10 MG tablet Take 10 mg by mouth at bedtime as needed for sleep. For sleep     No current facility-administered medications for this visit.    No Known Allergies   Past Medical History  Diagnosis Date  . Head injury   . Depression   . Headaches due to old head injury   . Migraine without aura, without mention of intractable migraine without mention of status migrainosus 09/14/2013  . Insomnia 09/14/2013  . Pseudoseizures   . Obese     Past Surgical History  Procedure Laterality Date  . Shoulder surgery      right arthroscopy, left  . Cholecystectomy    . Cesarean section    . Knee arthroscopy      bilateral  . Carpel tun      History   Social History  . Marital Status: Married    Spouse Name: N/A  . Number of Children: 2  . Years of Education: college   Occupational History  .      [employer]   Social History Main Topics  . Smoking status: Former Research scientist (life sciences)  . Smokeless tobacco: Never Used  . Alcohol Use: Yes     Comment: social  . Drug Use: No  . Sexual Activity: Not on file   Other Topics Concern  . Not on file   Social History Narrative    Family History  Problem Relation Age of Onset  . Hypertension Mother   . Cancer - Prostate Father   . Seizures Sister   . Migraines Sister   . Hypertension Brother   . Hypertension Brother   . CAD Mother     Died of MI at age 53    ROS: no fevers or chills,  productive cough, hemoptysis, dysphasia, odynophagia, melena, hematochezia, dysuria, hematuria, rash, seizure activity, orthopnea, PND, pedal edema, claudication. Remaining systems are negative.  Physical Exam:   Blood pressure 130/84, pulse 70, height 5\' 6"  (1.676 m), weight 200 lb (90.719 kg).  General:  Well developed/well nourished in NAD Skin warm/dry Patient not depressed No peripheral clubbing Back-normal HEENT-normal/normal eyelids Neck supple/normal carotid upstroke bilaterally; no bruits; no JVD; no thyromegaly chest - CTA/ normal expansion CV - RRR/normal S1 and S2; no rubs or gallops;  PMI nondisplaced; possible soft 1/6 systolic ejection murmur Abdomen -NT/ND, no HSM, no mass, + bowel sounds, no bruit 2+ femoral pulses, no bruits Ext-no edema, chords, 2+ DP Neuro-grossly nonfocal  ECG sinus rhythm at a rate of 70. Nonspecific ST changes.

## 2014-11-01 NOTE — Patient Instructions (Signed)
Your physician recommends that you schedule a follow-up appointment in: AS NEEDED  

## 2014-11-01 NOTE — Assessment & Plan Note (Signed)
Management per primary care. 

## 2015-07-09 ENCOUNTER — Other Ambulatory Visit: Payer: Self-pay

## 2015-07-09 DIAGNOSIS — Z1231 Encounter for screening mammogram for malignant neoplasm of breast: Secondary | ICD-10-CM

## 2015-08-10 ENCOUNTER — Ambulatory Visit
Admission: RE | Admit: 2015-08-10 | Discharge: 2015-08-10 | Disposition: A | Discharge status: Home / Self Care | Payer: Federal, State, Local not specified - PPO | Source: Ambulatory Visit

## 2015-08-10 DIAGNOSIS — Z1231 Encounter for screening mammogram for malignant neoplasm of breast: Secondary | ICD-10-CM

## 2015-10-04 DIAGNOSIS — M25562 Pain in left knee: Secondary | ICD-10-CM | POA: Diagnosis not present

## 2015-10-04 DIAGNOSIS — M1711 Unilateral primary osteoarthritis, right knee: Secondary | ICD-10-CM | POA: Diagnosis not present

## 2015-10-05 ENCOUNTER — Other Ambulatory Visit: Payer: Self-pay | Admitting: Orthopedic Surgery

## 2015-10-05 DIAGNOSIS — M25562 Pain in left knee: Secondary | ICD-10-CM

## 2015-10-11 ENCOUNTER — Ambulatory Visit
Admission: RE | Admit: 2015-10-11 | Discharge: 2015-10-11 | Disposition: A | Discharge status: Home / Self Care | Payer: Federal, State, Local not specified - PPO | Source: Ambulatory Visit | Attending: Orthopedic Surgery | Admitting: Orthopedic Surgery

## 2015-10-11 DIAGNOSIS — M25562 Pain in left knee: Secondary | ICD-10-CM

## 2015-10-11 DIAGNOSIS — M25462 Effusion, left knee: Secondary | ICD-10-CM | POA: Diagnosis not present

## 2015-10-18 DIAGNOSIS — F418 Other specified anxiety disorders: Secondary | ICD-10-CM | POA: Diagnosis not present

## 2015-10-18 DIAGNOSIS — G4700 Insomnia, unspecified: Secondary | ICD-10-CM | POA: Diagnosis not present

## 2015-10-23 DIAGNOSIS — M6752 Plica syndrome, left knee: Secondary | ICD-10-CM | POA: Diagnosis not present

## 2015-10-23 DIAGNOSIS — M1712 Unilateral primary osteoarthritis, left knee: Secondary | ICD-10-CM | POA: Diagnosis not present

## 2016-05-05 DIAGNOSIS — D126 Benign neoplasm of colon, unspecified: Secondary | ICD-10-CM | POA: Insufficient documentation

## 2016-05-05 HISTORY — DX: Benign neoplasm of colon, unspecified: D12.6

## 2016-05-05 HISTORY — PX: COLONOSCOPY W/ BIOPSIES: SHX1374

## 2016-05-15 ENCOUNTER — Other Ambulatory Visit: Payer: Self-pay | Admitting: Obstetrics and Gynecology

## 2016-05-15 DIAGNOSIS — Z1231 Encounter for screening mammogram for malignant neoplasm of breast: Secondary | ICD-10-CM

## 2016-05-19 DIAGNOSIS — G4700 Insomnia, unspecified: Secondary | ICD-10-CM | POA: Diagnosis not present

## 2016-05-19 DIAGNOSIS — Z1211 Encounter for screening for malignant neoplasm of colon: Secondary | ICD-10-CM | POA: Diagnosis not present

## 2016-05-19 DIAGNOSIS — F418 Other specified anxiety disorders: Secondary | ICD-10-CM | POA: Diagnosis not present

## 2016-05-30 DIAGNOSIS — D126 Benign neoplasm of colon, unspecified: Secondary | ICD-10-CM | POA: Diagnosis not present

## 2016-05-30 DIAGNOSIS — K635 Polyp of colon: Secondary | ICD-10-CM | POA: Diagnosis not present

## 2016-05-30 DIAGNOSIS — Z1211 Encounter for screening for malignant neoplasm of colon: Secondary | ICD-10-CM | POA: Diagnosis not present

## 2016-06-03 DIAGNOSIS — D126 Benign neoplasm of colon, unspecified: Secondary | ICD-10-CM | POA: Diagnosis not present

## 2016-06-03 DIAGNOSIS — Z1211 Encounter for screening for malignant neoplasm of colon: Secondary | ICD-10-CM | POA: Diagnosis not present

## 2016-06-03 DIAGNOSIS — K635 Polyp of colon: Secondary | ICD-10-CM | POA: Diagnosis not present

## 2016-08-11 ENCOUNTER — Ambulatory Visit
Admission: RE | Admit: 2016-08-11 | Discharge: 2016-08-11 | Disposition: A | Discharge status: Home / Self Care | Payer: Federal, State, Local not specified - PPO | Source: Ambulatory Visit | Attending: Obstetrics and Gynecology | Admitting: Obstetrics and Gynecology

## 2016-08-11 DIAGNOSIS — Z6829 Body mass index (BMI) 29.0-29.9, adult: Secondary | ICD-10-CM | POA: Diagnosis not present

## 2016-08-11 DIAGNOSIS — Z01419 Encounter for gynecological examination (general) (routine) without abnormal findings: Secondary | ICD-10-CM | POA: Diagnosis not present

## 2016-08-11 DIAGNOSIS — Z1231 Encounter for screening mammogram for malignant neoplasm of breast: Secondary | ICD-10-CM | POA: Diagnosis not present

## 2016-08-26 DIAGNOSIS — Z131 Encounter for screening for diabetes mellitus: Secondary | ICD-10-CM | POA: Diagnosis not present

## 2016-08-26 DIAGNOSIS — Z13820 Encounter for screening for osteoporosis: Secondary | ICD-10-CM | POA: Diagnosis not present

## 2016-08-26 DIAGNOSIS — E785 Hyperlipidemia, unspecified: Secondary | ICD-10-CM | POA: Diagnosis not present

## 2016-11-21 DIAGNOSIS — F418 Other specified anxiety disorders: Secondary | ICD-10-CM | POA: Diagnosis not present

## 2016-11-21 DIAGNOSIS — G4700 Insomnia, unspecified: Secondary | ICD-10-CM | POA: Diagnosis not present

## 2017-03-30 DIAGNOSIS — J209 Acute bronchitis, unspecified: Secondary | ICD-10-CM | POA: Diagnosis not present

## 2017-05-22 DIAGNOSIS — G4700 Insomnia, unspecified: Secondary | ICD-10-CM | POA: Diagnosis not present

## 2017-05-22 DIAGNOSIS — F418 Other specified anxiety disorders: Secondary | ICD-10-CM | POA: Diagnosis not present

## 2017-06-03 DIAGNOSIS — K080 Exfoliation of teeth due to systemic causes: Secondary | ICD-10-CM | POA: Diagnosis not present

## 2017-06-09 DIAGNOSIS — K080 Exfoliation of teeth due to systemic causes: Secondary | ICD-10-CM | POA: Diagnosis not present

## 2017-07-14 ENCOUNTER — Other Ambulatory Visit: Payer: Self-pay | Admitting: Obstetrics and Gynecology

## 2017-07-14 DIAGNOSIS — Z1231 Encounter for screening mammogram for malignant neoplasm of breast: Secondary | ICD-10-CM

## 2017-08-12 DIAGNOSIS — Z01419 Encounter for gynecological examination (general) (routine) without abnormal findings: Secondary | ICD-10-CM | POA: Diagnosis not present

## 2017-08-12 DIAGNOSIS — E785 Hyperlipidemia, unspecified: Secondary | ICD-10-CM | POA: Diagnosis not present

## 2017-08-12 DIAGNOSIS — Z131 Encounter for screening for diabetes mellitus: Secondary | ICD-10-CM | POA: Diagnosis not present

## 2017-08-12 DIAGNOSIS — Z6832 Body mass index (BMI) 32.0-32.9, adult: Secondary | ICD-10-CM | POA: Diagnosis not present

## 2017-08-13 ENCOUNTER — Ambulatory Visit
Admission: RE | Admit: 2017-08-13 | Discharge: 2017-08-13 | Disposition: A | Discharge status: Home / Self Care | Payer: Federal, State, Local not specified - PPO | Source: Ambulatory Visit | Attending: Obstetrics and Gynecology | Admitting: Obstetrics and Gynecology

## 2017-08-13 DIAGNOSIS — Z1231 Encounter for screening mammogram for malignant neoplasm of breast: Secondary | ICD-10-CM | POA: Diagnosis not present

## 2017-08-26 DIAGNOSIS — E785 Hyperlipidemia, unspecified: Secondary | ICD-10-CM | POA: Diagnosis not present

## 2017-08-26 DIAGNOSIS — F418 Other specified anxiety disorders: Secondary | ICD-10-CM | POA: Diagnosis not present

## 2017-11-23 DIAGNOSIS — H1131 Conjunctival hemorrhage, right eye: Secondary | ICD-10-CM | POA: Diagnosis not present

## 2017-12-02 DIAGNOSIS — F418 Other specified anxiety disorders: Secondary | ICD-10-CM | POA: Diagnosis not present

## 2017-12-02 DIAGNOSIS — E785 Hyperlipidemia, unspecified: Secondary | ICD-10-CM | POA: Diagnosis not present

## 2017-12-02 DIAGNOSIS — Z0000 Encounter for general adult medical examination without abnormal findings: Secondary | ICD-10-CM | POA: Diagnosis not present

## 2017-12-02 DIAGNOSIS — Z130 Encounter for screening for diseases of the blood and blood-forming organs and certain disorders involving the immune mechanism: Secondary | ICD-10-CM | POA: Diagnosis not present

## 2017-12-02 LAB — CBC AND DIFFERENTIAL
Hemoglobin: 13.6 (ref 12.0–16.0)
Platelets: 273 (ref 150–399)
WBC: 10.9

## 2017-12-02 LAB — HEPATIC FUNCTION PANEL
ALT: 22 (ref 7–35)
AST: 21 (ref 13–35)
Bilirubin, Total: 0.4

## 2017-12-02 LAB — LIPID PANEL
Cholesterol: 217 — AB (ref 0–200)
HDL: 48 (ref 35–70)
LDL Cholesterol: 140
Triglycerides: 147 (ref 40–160)

## 2017-12-02 LAB — BASIC METABOLIC PANEL
BUN: 11 (ref 4–21)
Creatinine: 0.7 (ref 0.5–1.1)

## 2017-12-02 LAB — CBC: RBC: 4.65 (ref 3.87–5.11)

## 2017-12-07 DIAGNOSIS — K080 Exfoliation of teeth due to systemic causes: Secondary | ICD-10-CM | POA: Diagnosis not present

## 2017-12-31 DIAGNOSIS — M25551 Pain in right hip: Secondary | ICD-10-CM | POA: Diagnosis not present

## 2018-02-01 DIAGNOSIS — K30 Functional dyspepsia: Secondary | ICD-10-CM | POA: Diagnosis not present

## 2018-02-01 DIAGNOSIS — F418 Other specified anxiety disorders: Secondary | ICD-10-CM | POA: Diagnosis not present

## 2018-03-12 DIAGNOSIS — M79671 Pain in right foot: Secondary | ICD-10-CM | POA: Diagnosis not present

## 2018-03-17 DIAGNOSIS — H18823 Corneal disorder due to contact lens, bilateral: Secondary | ICD-10-CM | POA: Diagnosis not present

## 2018-03-22 DIAGNOSIS — H11153 Pinguecula, bilateral: Secondary | ICD-10-CM | POA: Diagnosis not present

## 2018-04-06 DIAGNOSIS — R140 Abdominal distension (gaseous): Secondary | ICD-10-CM | POA: Diagnosis not present

## 2018-04-09 ENCOUNTER — Other Ambulatory Visit: Payer: Self-pay | Admitting: Family Medicine

## 2018-04-09 DIAGNOSIS — R140 Abdominal distension (gaseous): Secondary | ICD-10-CM

## 2018-04-13 ENCOUNTER — Ambulatory Visit
Admission: RE | Admit: 2018-04-13 | Discharge: 2018-04-13 | Disposition: A | Discharge status: Home / Self Care | Payer: Federal, State, Local not specified - PPO | Source: Ambulatory Visit | Attending: Family Medicine | Admitting: Family Medicine

## 2018-04-13 DIAGNOSIS — R140 Abdominal distension (gaseous): Secondary | ICD-10-CM | POA: Diagnosis not present

## 2018-04-20 ENCOUNTER — Other Ambulatory Visit: Payer: Self-pay | Admitting: Gastroenterology

## 2018-04-20 DIAGNOSIS — R1031 Right lower quadrant pain: Secondary | ICD-10-CM

## 2018-04-20 DIAGNOSIS — R140 Abdominal distension (gaseous): Secondary | ICD-10-CM

## 2018-04-20 DIAGNOSIS — R109 Unspecified abdominal pain: Secondary | ICD-10-CM

## 2018-04-20 DIAGNOSIS — R1011 Right upper quadrant pain: Secondary | ICD-10-CM | POA: Diagnosis not present

## 2018-04-21 ENCOUNTER — Ambulatory Visit
Admission: RE | Admit: 2018-04-21 | Discharge: 2018-04-21 | Disposition: A | Discharge status: Home / Self Care | Payer: Federal, State, Local not specified - PPO | Source: Ambulatory Visit | Attending: Gastroenterology | Admitting: Gastroenterology

## 2018-04-21 ENCOUNTER — Other Ambulatory Visit: Payer: Self-pay | Admitting: Gastroenterology

## 2018-04-21 DIAGNOSIS — R140 Abdominal distension (gaseous): Secondary | ICD-10-CM

## 2018-04-21 DIAGNOSIS — R1031 Right lower quadrant pain: Secondary | ICD-10-CM

## 2018-04-21 DIAGNOSIS — R109 Unspecified abdominal pain: Secondary | ICD-10-CM

## 2018-04-21 MED ORDER — IOHEXOL 300 MG/ML  SOLN
100.0000 mL | Freq: Once | INTRAMUSCULAR | Status: AC | PRN
Start: 2018-04-21 — End: 2018-04-21
  Administered 2018-04-21: 100 mL via INTRAVENOUS

## 2018-05-12 DIAGNOSIS — R05 Cough: Secondary | ICD-10-CM | POA: Diagnosis not present

## 2018-05-12 DIAGNOSIS — R062 Wheezing: Secondary | ICD-10-CM | POA: Diagnosis not present

## 2018-05-18 DIAGNOSIS — G8918 Other acute postprocedural pain: Secondary | ICD-10-CM | POA: Diagnosis not present

## 2018-05-18 DIAGNOSIS — M2021 Hallux rigidus, right foot: Secondary | ICD-10-CM | POA: Diagnosis not present

## 2018-05-28 DIAGNOSIS — R1031 Right lower quadrant pain: Secondary | ICD-10-CM | POA: Diagnosis not present

## 2018-07-05 DIAGNOSIS — M2021 Hallux rigidus, right foot: Secondary | ICD-10-CM | POA: Diagnosis not present

## 2018-07-17 HISTORY — PX: GREAT TOE ARTHRODESIS, METATARSALPHALANGEAL JOINT: SUR56

## 2018-08-19 ENCOUNTER — Other Ambulatory Visit: Payer: Self-pay | Admitting: Obstetrics and Gynecology

## 2018-08-19 DIAGNOSIS — Z1231 Encounter for screening mammogram for malignant neoplasm of breast: Secondary | ICD-10-CM

## 2018-09-01 DIAGNOSIS — F418 Other specified anxiety disorders: Secondary | ICD-10-CM | POA: Diagnosis not present

## 2018-10-05 DIAGNOSIS — M25562 Pain in left knee: Secondary | ICD-10-CM | POA: Diagnosis not present

## 2018-10-21 ENCOUNTER — Ambulatory Visit: Payer: Federal, State, Local not specified - PPO

## 2018-12-28 ENCOUNTER — Other Ambulatory Visit: Payer: Self-pay | Admitting: Orthopedic Surgery

## 2018-12-28 DIAGNOSIS — M25562 Pain in left knee: Secondary | ICD-10-CM

## 2018-12-30 DIAGNOSIS — M7122 Synovial cyst of popliteal space [Baker], left knee: Secondary | ICD-10-CM | POA: Diagnosis not present

## 2018-12-30 DIAGNOSIS — M25462 Effusion, left knee: Secondary | ICD-10-CM | POA: Diagnosis not present

## 2018-12-30 DIAGNOSIS — M1712 Unilateral primary osteoarthritis, left knee: Secondary | ICD-10-CM | POA: Diagnosis not present

## 2018-12-30 DIAGNOSIS — S83242A Other tear of medial meniscus, current injury, left knee, initial encounter: Secondary | ICD-10-CM | POA: Diagnosis not present

## 2019-01-16 ENCOUNTER — Other Ambulatory Visit: Payer: Federal, State, Local not specified - PPO

## 2019-01-24 ENCOUNTER — Other Ambulatory Visit: Payer: Federal, State, Local not specified - PPO

## 2019-02-02 DIAGNOSIS — M1712 Unilateral primary osteoarthritis, left knee: Secondary | ICD-10-CM | POA: Diagnosis not present

## 2019-02-02 DIAGNOSIS — S83232A Complex tear of medial meniscus, current injury, left knee, initial encounter: Secondary | ICD-10-CM | POA: Diagnosis not present

## 2019-02-02 DIAGNOSIS — Y999 Unspecified external cause status: Secondary | ICD-10-CM | POA: Diagnosis not present

## 2019-02-02 DIAGNOSIS — M94262 Chondromalacia, left knee: Secondary | ICD-10-CM | POA: Diagnosis not present

## 2019-02-02 DIAGNOSIS — M659 Synovitis and tenosynovitis, unspecified: Secondary | ICD-10-CM | POA: Diagnosis not present

## 2019-02-02 DIAGNOSIS — X58XXXA Exposure to other specified factors, initial encounter: Secondary | ICD-10-CM | POA: Diagnosis not present

## 2019-02-07 DIAGNOSIS — M1712 Unilateral primary osteoarthritis, left knee: Secondary | ICD-10-CM | POA: Diagnosis not present

## 2019-03-10 DIAGNOSIS — R7303 Prediabetes: Secondary | ICD-10-CM | POA: Diagnosis not present

## 2019-03-10 DIAGNOSIS — Z13220 Encounter for screening for lipoid disorders: Secondary | ICD-10-CM | POA: Diagnosis not present

## 2019-03-10 DIAGNOSIS — Z131 Encounter for screening for diabetes mellitus: Secondary | ICD-10-CM | POA: Diagnosis not present

## 2019-03-10 DIAGNOSIS — R05 Cough: Secondary | ICD-10-CM | POA: Diagnosis not present

## 2019-03-10 DIAGNOSIS — Z0000 Encounter for general adult medical examination without abnormal findings: Secondary | ICD-10-CM | POA: Diagnosis not present

## 2019-03-10 LAB — HEPATIC FUNCTION PANEL
ALT: 27 (ref 7–35)
AST: 22 (ref 13–35)
Bilirubin, Total: 0.4

## 2019-03-10 LAB — BASIC METABOLIC PANEL
BUN: 14 (ref 4–21)
Creatinine: 0.7 (ref 0.5–1.1)
Potassium: 4.2 (ref 3.4–5.3)
Sodium: 139 (ref 137–147)

## 2019-03-10 LAB — HEMOGLOBIN A1C: Hemoglobin A1C: 6.2

## 2019-03-10 LAB — LIPID PANEL
Cholesterol: 205 — AB (ref 0–200)
HDL: 49 (ref 35–70)
LDL Cholesterol: 140
Triglycerides: 80 (ref 40–160)

## 2019-03-17 ENCOUNTER — Other Ambulatory Visit: Payer: Self-pay

## 2019-03-17 ENCOUNTER — Ambulatory Visit: Payer: Federal, State, Local not specified - PPO | Admitting: Allergy

## 2019-03-17 ENCOUNTER — Encounter: Payer: Self-pay | Admitting: Allergy

## 2019-03-17 VITALS — BP 140/84 | HR 81 | Temp 96.2°F | Resp 18 | Ht 65.12 in | Wt 211.8 lb

## 2019-03-17 DIAGNOSIS — J310 Chronic rhinitis: Secondary | ICD-10-CM | POA: Insufficient documentation

## 2019-03-17 DIAGNOSIS — R05 Cough: Secondary | ICD-10-CM

## 2019-03-17 DIAGNOSIS — L299 Pruritus, unspecified: Secondary | ICD-10-CM | POA: Diagnosis not present

## 2019-03-17 DIAGNOSIS — R059 Cough, unspecified: Secondary | ICD-10-CM | POA: Insufficient documentation

## 2019-03-17 MED ORDER — SPACER/AERO-HOLDING CHAMBERS DEVI: 0 refills | Status: DC

## 2019-03-17 MED ORDER — ALBUTEROL SULFATE HFA 108 (90 BASE) MCG/ACT IN AERS: 1.0000 | INHALATION_SPRAY | Freq: Four times a day (QID) | RESPIRATORY_TRACT | 2 refills | Status: DC | PRN

## 2019-03-17 MED ORDER — BUDESONIDE-FORMOTEROL FUMARATE 80-4.5 MCG/ACT IN AERO: 2.0000 | INHALATION_SPRAY | Freq: Two times a day (BID) | RESPIRATORY_TRACT | 3 refills | Status: DC

## 2019-03-17 NOTE — Assessment & Plan Note (Addendum)
Coughing, wheezing, SOB for the past 3 yeas in January and October for a few months at a time. No specific triggers noted but exertion does make it worse. No previous inhaler use. Had 2 courses of oral prednisone this year with some benefit. Norma CXR in October 2020. Denies heartburn/reflux/PND.  Today's spirometry showed mild restriction with no improvement in FEV1 post bronchodilator treatment.   Today's skin testing showed: Negative to environmental allergies however given your past history will double check this with bloodwork. The most common causes of chronic cough include the following: upper airway cough syndrome (UACS) which is caused by variety of rhinitis conditions; asthma; gastroesophageal reflux disease (GERD); chronic bronchitis from cigarette smoking or other inhaled environmental irritants; non-asthmatic eosinophilic bronchitis; and bronchiectasis.  In prospective studies, these conditions have accounted for up to 94% of the causes of chronic cough in immunocompetent adults.  Given her clinical symptoms, will start with ICS/LABA inhaler first and gave handout on heartburn lifestyle modification diet.  . Daily controller medication(s): start Symbicort 80 2 puffs twice a day with spacer and rinse mouth afterwards. Demonstrated proper use.  Marland Kitchen Rescue medications: May use albuterol rescue inhaler 2 puffs or nebulizer every 4 to 6 hours as needed for shortness of breath, chest tightness, coughing, and wheezing. Monitor frequency of use. . Repeat spirometry at next visit.

## 2019-03-17 NOTE — Assessment & Plan Note (Signed)
Rhino conjunctivitis symptoms in the winter, spring and fall for the past 3 years. She had skin testing over 10 years which was positive to grass, mold, trees, cats and dogs per patient report.  Today's skin testing was negative to environmental allergies however given your past history will double check this with bloodwork.  Will make additional recommendations based on results.

## 2019-03-17 NOTE — Patient Instructions (Addendum)
Today's skin testing showed: Negative to environmental allergies however given your past history will double check this with bloodwork.  . Get bloodwork:  o We are ordering labs, so please allow 1-2 weeks for the results to come back. o With the newly implemented Cures Act, the labs might be visible to you at the same time that they become visible to me. However, I will not address the results until all of the results are back, so please be patient.  o In the meantime, continue recommendations in your patient instructions, including avoidance measures (if applicable), until you hear from me.  Coughing: The most common causes of chronic cough include the following: upper airway cough syndrome (UACS) which is caused by variety of rhinitis conditions; asthma; gastroesophageal reflux disease (GERD); chronic bronchitis from cigarette smoking or other inhaled environmental irritants; non-asthmatic eosinophilic bronchitis; and bronchiectasis.  In prospective studies, these conditions have accounted for up to 94% of the causes of chronic cough in immunocompetent adults.   . Daily controller medication(s): start Symbicort 80 2 puffs twice a day with spacer and rinse mouth afterwards. . Rescue medications: May use albuterol rescue inhaler 2 puffs or nebulizer every 4 to 6 hours as needed for shortness of breath, chest tightness, coughing, and wheezing. Monitor frequency of use.  . Breathing control goals:  o Full participation in all desired activities (may need albuterol before activity) o Albuterol use two times or less a week on average (not counting use with activity) o Cough interfering with sleep two times or less a month o Oral steroids no more than once a year o No hospitalizations  Itching:  Take cetirizine 10mg  daily at night.  Start proper skin care as below.   Follow up in 4 weeks to check on the coughing and itching.  Skin care recommendations  Bath time: . Always use lukewarm  water. AVOID very hot or cold water. Marland Kitchen Keep bathing time to 5-10 minutes. . Do NOT use bubble bath. . Use a mild soap and use just enough to wash the dirty areas. . Do NOT scrub skin vigorously.  . After bathing, pat dry your skin with a towel. Do NOT rub or scrub the skin.  Moisturizers and prescriptions:  . ALWAYS apply moisturizers immediately after bathing (within 3 minutes). This helps to lock-in moisture. . Use the moisturizer several times a day over the whole body. Kermit Balo summer moisturizers include: Aveeno, CeraVe, Cetaphil. Kermit Balo winter moisturizers include: Aquaphor, Vaseline, Cerave, Cetaphil, Eucerin, Vanicream. . When using moisturizers along with medications, the moisturizer should be applied about one hour after applying the medication to prevent diluting effect of the medication or moisturize around where you applied the medications. When not using medications, the moisturizer can be continued twice daily as maintenance.  Laundry and clothing: . Avoid laundry products with added color or perfumes. . Use unscented hypo-allergenic laundry products such as Tide free, Cheer free & gentle, and All free and clear.  . If the skin still seems dry or sensitive, you can try double-rinsing the clothes. . Avoid tight or scratchy clothing such as wool. . Do not use fabric softeners or dyer sheets.   Heartburn Heartburn is a type of pain or discomfort that can happen in the throat or chest. It is often described as a burning pain. It may also cause a bad, acid-like taste in the mouth. Heartburn may feel worse when you lie down or bend over. It may be worse at night. It may be  caused by stomach contents that move back up (reflux) into the tube that connects the mouth with the stomach (esophagus). Follow these instructions at home: Eating and drinking   Avoid certain foods and drinks as told by your doctor. This may include: ? Coffee and tea (with or without caffeine). ? Drinks that  have alcohol. ? Energy drinks and sports drinks. ? Carbonated drinks or sodas. ? Chocolate and cocoa. ? Peppermint and mint flavorings. ? Garlic and onions. ? Horseradish. ? Spicy and acidic foods, such as:  Peppers.  Chili powder and curry powder.  Vinegar.  Hot sauces and BBQ sauce. ? Citrus fruit juices and citrus fruits, such as:  Oranges.  Lemons.  Limes. ? Tomato-based foods, such as:  Red sauce and pizza with red sauce.  Chili.  Salsa. ? Fried and fatty foods, such as:  Donuts.  [place] fries and potato chips.  High-fat dressings. ? High-fat meats, such as:  Hot dogs and sausage.  Rib eye steak.  Ham and bacon. ? High-fat dairy items, such as:  Whole milk.  Butter.  Cream cheese.  Eat small meals often. Avoid eating large meals.  Avoid drinking large amounts of liquid with your meals.  Avoid eating meals during the 2-3 hours before bedtime.  Avoid lying down right after you eat.  Do not exercise right after you eat. Lifestyle      If you are overweight, lose an amount of weight that is healthy for you. Ask your doctor about a safe weight loss goal.  Do not use any products that contain nicotine or tobacco, including cigarettes, e-cigarettes, and chewing tobacco. These can make your symptoms worse. If you need help quitting, ask your doctor.  Wear loose clothes. Do not wear anything tight around your waist.  Raise (elevate) the head of your bed about 6 inches (15 cm) when you sleep.  Try to lower your stress. If you need help doing this, ask your doctor. General instructions  Pay attention to any changes in your symptoms.  Take over-the-counter and prescription medicines only as told by your doctor. ? Do not take aspirin, ibuprofen, or other NSAIDs unless your doctor says it is okay. ? Stop medicines only as told by your doctor.  Keep all follow-up visits as told by your doctor. This is important. Contact a doctor if:  You  have new symptoms.  You lose weight and you do not know why it is happening.  You have trouble swallowing, or it hurts to swallow.  You have wheezing or a cough that keeps happening.  Your symptoms do not get better with treatment.  You have heartburn often for more than 2 weeks. Get help right away if:  You have pain in your arms, neck, jaw, teeth, or back.  You feel sweaty, dizzy, or light-headed.  You have chest pain or shortness of breath.  You throw up (vomit) and your throw up looks like blood or coffee grounds.  Your poop (stool) is bloody or black. These symptoms may represent a serious problem that is an emergency. Do not wait to see if the symptoms will go away. Get medical help right away. Call your local emergency services (911 in the [place] Do not drive yourself to the hospital. Summary  Heartburn is a type of pain that can happen in the throat or chest. It can feel like a burning pain. It may also cause a bad, acid-like taste in the mouth.  You may need to avoid certain foods  and drinks to help your symptoms. Ask your doctor what foods and drinks you should avoid.  Take over-the-counter and prescription medicines only as told by your doctor. Do not take aspirin, ibuprofen, or other NSAIDs unless your doctor told you to do so.  Contact your doctor if your symptoms do not get better or they get worse. This information is not intended to replace advice given to you by your health care provider. Make sure you discuss any questions you have with your health care provider. Document Released: 01/01/2011 Document Revised: 09/21/2017 Document Reviewed: 09/21/2017 Elsevier Patient Education  2020 [employer].

## 2019-03-17 NOTE — Progress Notes (Signed)
New Patient Note  RE: Lindsay Hamilton MRN: [id number] DOB: 08/21/1965 Date of Office Visit: 03/17/2019  Referring provider: Orpah Melter, MD Primary care provider: Orpah Melter, MD  Chief Complaint: Cough (starting around January 2020) and Allergic Rhinitis  (itching all over body, sneezing)  History of Present Illness: I had the pleasure of seeing Lindsay Hamilton for initial evaluation at the [facility] on 03/17/2019. She is a 53 y.o. female, who is referred here by Orpah Melter, MD for the evaluation of coughing and allergic rhinitis.   Cough: Patient has been having issues with coughing in January and October which lasts a few months at a time for the past 3 years.  She also reports symptoms of chest tightness, shortness of breath, wheezing, nocturnal awakenings for 3 years. Current medications include none. She tried the following inhalers: none. Main triggers are unknown but exercise does trigger coughing. Coughing is worse in the mornings. She does drink coffee in the mornings.   In the last month, frequency of symptoms: daily. Frequency of nocturnal symptoms: a few times per week. Frequency of SABA use: 0x/week. Interference with physical activity: yes. Sleep is disturbed. In the last 12 months, emergency room visits/urgent care visits/doctor office visits or hospitalizations due to respiratory issues: 3. In the last 12 months, oral steroids courses: 2. Lifetime history of hospitalization for respiratory issues: no. Prior intubations: no. History of pneumonia: once in July 1996. She was evaluated by allergist in the past. Smoking exposure: no. Up to date with flu vaccine: yes.  History of reflux: no. Patient had normal chest X-ray about 1 week ago.  She also had a normal CMP per her report.   Rhinitis: She reports symptoms of sneezing, nasal congestion, itchy/watery eyes. Symptoms have been going on for 3 years. The symptoms are present mainly from  January through spring, and fall. Other triggers include exposure to none. Anosmia: no. Headache: yes. She has used zyrtec, Claritin and benadryl with minimal benefit. Sinus infections: none. Previous work up includes: about 10 years ago had skin testing which was positive to grass, mold, trees, cats and dogs per patient report. Previous ENT evaluation: no. Previous sinus imaging: no. Last eye exam: last year. History of nasal polyps: no.  CXR 03/11/2019: "No active cardiopulmonary disease."  Assessment and Plan: Lindsay Hamilton is a 53 y.o. female with: Coughing Coughing, wheezing, SOB for the past 3 yeas in January and October for a few months at a time. No specific triggers noted but exertion does make it worse. No previous inhaler use. Had 2 courses of oral prednisone this year with some benefit. Lindsay Hamilton CXR in October 2020. Denies heartburn/reflux/PND.  Today's spirometry showed mild restriction with no improvement in FEV1 post bronchodilator treatment.   Today's skin testing showed: Negative to environmental allergies however given your past history will double check this with bloodwork. The most common causes of chronic cough include the following: upper airway cough syndrome (UACS) which is caused by variety of rhinitis conditions; asthma; gastroesophageal reflux disease (GERD); chronic bronchitis from cigarette smoking or other inhaled environmental irritants; non-asthmatic eosinophilic bronchitis; and bronchiectasis.  In prospective studies, these conditions have accounted for up to 94% of the causes of chronic cough in immunocompetent adults.  Given her clinical symptoms, will start with ICS/LABA inhaler first and gave handout on heartburn lifestyle modification diet.  . Daily controller medication(s): start Symbicort 80 2 puffs twice a day with spacer and rinse mouth afterwards. Demonstrated proper use.  Marland Kitchen  Rescue medications: May use albuterol rescue inhaler 2 puffs or nebulizer every 4 to 6  hours as needed for shortness of breath, chest tightness, coughing, and wheezing. Monitor frequency of use. . Repeat spirometry at next visit.   Chronic rhinitis Rhino conjunctivitis symptoms in the winter, spring and fall for the past 3 years. She had skin testing over 10 years which was positive to grass, mold, trees, cats and dogs per patient report.  Today's skin testing was negative to environmental allergies however given your past history will double check this with bloodwork.  Will make additional recommendations based on results.    Pruritus Whole body pruritus for the past few months. Denies changes in diet, medications or personal care products. Recent CMP per patient report was unremarkable.  Discussed that the most common cause of pruritus is usually xerosis.  Gave handout on proper skin care.  Will check CBC diff and thyroid.   Start zyrtec 10mg  daily and see if it helps with the itching.   Return in about 4 weeks (around 04/14/2019).  Meds ordered this encounter  Medications  . budesonide-formoterol (SYMBICORT) 80-4.5 MCG/ACT inhaler    Sig: Inhale 2 puffs into the lungs 2 (two) times daily.    Dispense:  1 Inhaler    Refill:  3  . Spacer/Aero-Holding Chambers DEVI    Sig: Use with Symbicort PRN    Dispense:  1 Device    Refill:  0  . albuterol (VENTOLIN HFA) 108 (90 Base) MCG/ACT inhaler    Sig: Inhale 1-2 puffs into the lungs every 6 (six) hours as needed for wheezing or shortness of breath (every 4-6 hours as needed for wheezing and coughing).    Dispense:  18 g    Refill:  2    Lab Orders     Allergens w/Total IgE Area 2     CBC w/Diff/Platelet     Thyroid Cascade Profile  Other allergy screening: Food allergy: no Medication allergy: no Hymenoptera allergy: no Urticaria: yes sometimes Patient has been having issues with itchy skin for the past few months.  Currently uses Cetaphil. Denies any changes in diet, medications or personal care products.    Eczema:no History of recurrent infections suggestive of immunodeficency: no  Diagnostics: Spirometry:  Tracings reviewed. Her effort: Good reproducible efforts. FVC: 2.98L FEV1: 2.55L, 86% predicted FEV1/FVC ratio: 86% Interpretation: Spirometry consistent with possible restrictive disease with no improvement in FEV1 post bronchodilator treatment.  Please see scanned spirometry results for details.  Skin Testing: Environmental allergy panel and select foods. Negative test to: environmental allergies and common basic foods.  Results discussed with patient/family. Airborne Adult Perc - 03/17/19 0953    Time Antigen Placed  [id number]    Allergen Manufacturer  Lavella Hammock    Location  Back    Number of Test  59    Panel 1  Select    1. Control-Buffer 50% Glycerol  Negative    2. Control-Histamine 1 mg/ml  2+    3. Albumin saline  Negative    4. Canyon Day  Negative    5. Guatemala  Negative    6. Johnson  Negative    7. Midland Blue  Negative    8. Meadow Fescue  Negative    9. Perennial Rye  Negative    10. Sweet Vernal  Negative    11. Timothy  Negative    12. Cocklebur  Negative    13. Burweed Marshelder  Negative    14. Ragweed, short  Negative    15. Ragweed, Giant  Negative    16. Plantain,  English  Negative    17. Lamb's Quarters  Negative    18. Sheep Sorrell  Negative    19. Rough Pigweed  Negative    20. Marsh Elder, Rough  Negative    21. Mugwort, Common  Negative    22. Ash mix  Negative    23. Birch mix  Negative    24. Beech American  Negative    25. Box, Elder  Negative    26. Cedar, red  Negative    27. Cottonwood, [place]  Negative    28. Elm mix  Negative    29. Hickory mix  Negative    30. Maple mix  Negative    31. Oak, [place] mix  Negative    32. Pecan Pollen  Negative    33. Pine mix  Negative    34. Sycamore Eastern  Negative    35. Duval, Black Pollen  Negative    36. Alternaria alternata  Negative    37. Cladosporium Herbarum  Negative    38.  Aspergillus mix  Negative    39. Penicillium mix  Negative    40. Bipolaris sorokiniana (Helminthosporium)  Negative    41. Drechslera spicifera (Curvularia)  Negative    42. Mucor plumbeus  Negative    43. Fusarium moniliforme  Negative    44. Aureobasidium pullulans (pullulara)  Negative    45. Rhizopus oryzae  Negative    46. Botrytis cinera  Negative    47. Epicoccum nigrum  Negative    48. Phoma betae  Negative    49. Candida Albicans  Negative    50. Trichophyton mentagrophytes  Negative    51. Mite, D Farinae  5,000 AU/ml  Negative    52. Mite, D Pteronyssinus  5,000 AU/ml  Negative    53. Cat Hair 10,000 BAU/ml  Negative    54.  Dog Epithelia  Negative    55. Mixed Feathers  Negative    56. Horse Epithelia  Negative    57. Cockroach, German  Negative    58. Mouse  Negative    59. Tobacco Leaf  Negative     Food Adult Perc - 03/17/19 0900    Time Antigen Placed  [id number]    Allergen Manufacturer  Lavella Hammock    Location  Back    Number of allergen test  10    1. Peanut  Negative    2. Soybean  Negative    3. Wheat  Negative    4. Sesame  Negative    5. Milk, cow  Negative    6. Egg White, Chicken  Negative    7. Casein  Negative    8. Shellfish Mix  Negative    9. Fish Mix  Negative    10. Cashew  Negative       Past Medical History: Patient Active Problem List   Diagnosis Date Noted  . Coughing 03/17/2019  . Pruritus 03/17/2019  . Chronic rhinitis 03/17/2019  . Murmur 11/01/2014  . Migraine without aura 09/14/2013  . Insomnia 09/14/2013   Past Medical History:  Diagnosis Date  . Depression   . Head injury   . Headaches due to old head injury   . Insomnia 09/14/2013  . Migraine without aura, without mention of intractable migraine without mention of status migrainosus 09/14/2013  . Obese   . Pseudoseizures    Past Surgical History: Past Surgical  History:  Procedure Laterality Date  . ABDOMINAL HYSTERECTOMY    . Carpel Tun    . CESAREAN SECTION    .  CHOLECYSTECTOMY    . KNEE ARTHROSCOPY     bilateral  . SHOULDER SURGERY     right arthroscopy, left   Medication List:  Current Outpatient Medications  Medication Sig Dispense Refill  . ALPRAZolam (XANAX) 0.25 MG tablet Take 0.25 mg by mouth as needed for anxiety.     Marland Kitchen buPROPion (WELLBUTRIN XL) 150 MG 24 hr tablet bupropion HCl XL 150 mg 24 hr tablet, extended release    . escitalopram (LEXAPRO) 20 MG tablet Take 20 mg by mouth at bedtime.    . Eszopiclone 3 MG TABS eszopiclone 3 mg tablet    . albuterol (VENTOLIN HFA) 108 (90 Base) MCG/ACT inhaler Inhale 1-2 puffs into the lungs every 6 (six) hours as needed for wheezing or shortness of breath (every 4-6 hours as needed for wheezing and coughing). 18 g 2  . budesonide-formoterol (SYMBICORT) 80-4.5 MCG/ACT inhaler Inhale 2 puffs into the lungs 2 (two) times daily. 1 Inhaler 3  . Spacer/Aero-Holding Dorise Bullion Use with Symbicort PRN 1 Device 0   No current facility-administered medications for this visit.    Allergies: No Known Allergies Social History: Social History   Socioeconomic History  . Marital status: Married    Spouse name: Not on file  . Number of children: 2  . Years of education: college  . Highest education level: Not on file  Occupational History    Employer: [employer]    Comment: [employer]  Social Needs  . Financial resource strain: Not on file  . Food insecurity    Worry: Not on file    Inability: Not on file  . Transportation needs    Medical: Not on file    Non-medical: Not on file  Tobacco Use  . Smoking status: Former Research scientist (life sciences)  . Smokeless tobacco: Never Used  Substance and Sexual Activity  . Alcohol use: Yes    Comment: social  . Drug use: No  . Sexual activity: Not on file  Lifestyle  . Physical activity    Days per week: Not on file    Minutes per session: Not on file  . Stress: Not on file  Relationships  . Social Herbalist on phone: Not on  file    Gets together: Not on file    Attends religious service: Not on file    Active member of club or organization: Not on file    Attends meetings of clubs or organizations: Not on file    Relationship status: Not on file  Other Topics Concern  . Not on file  Social History Narrative  . Not on file   Lives in a home which is 52 years old. Smoking: denies Occupation: surgical tech  Environmental History: Water Damage/mildew in the house: no Carpet in the family room: yes Carpet in the bedroom: yes Heating: gas Cooling: central Pet: yes 1 cat x 16 yrs  Family History: Family History  Problem Relation Age of Onset  . Hypertension Mother   . CAD Mother        Died of MI at age 52  . Asthma Mother   . Cancer - Prostate Father   . Seizures Sister   . Migraines Sister   . Hypertension Brother   . Hypertension Brother   . Breast cancer Neg Hx   .  Allergic rhinitis Neg Hx   . Eczema Neg Hx   . Urticaria Neg Hx    Review of Systems  Constitutional: Negative for appetite change, chills, fever and unexpected weight change.  HENT: Positive for congestion. Negative for rhinorrhea.   Eyes: Negative for itching.  Respiratory: Positive for cough, chest tightness, shortness of breath and wheezing.   Cardiovascular: Negative for chest pain.  Gastrointestinal: Negative for abdominal pain.  Genitourinary: Negative for difficulty urinating.  Skin: Negative for rash.  Allergic/Immunologic: Negative for environmental allergies and food allergies.  Neurological: Positive for headaches.   Objective: BP 140/84 (BP Location: Left Arm, Patient Position: Sitting, Cuff Size: Normal)   Pulse 81   Temp (!) 96.2 F (35.7 C) (Temporal)   Resp 18   Ht 5' 5.12" (1.654 m)   Wt 211 lb 12.8 oz (96.1 kg)   SpO2 96%   BMI 35.12 kg/m  Body mass index is 35.12 kg/m. Physical Exam  Constitutional: She is oriented to person, place, and time. She appears well-developed and well-nourished.   HENT:  Head: Normocephalic and atraumatic.  Right Ear: External ear normal.  Left Ear: External ear normal.  Nose: Nose normal.  Mouth/Throat: Oropharynx is clear and moist.  Eyes: Conjunctivae and EOM are normal.  Neck: Neck supple.  Cardiovascular: Normal rate, regular rhythm and normal heart sounds. Exam reveals no gallop and no friction rub.  No murmur heard. Pulmonary/Chest: Effort normal and breath sounds normal. She has no wheezes. She has no rales.  Abdominal: Soft.  Neurological: She is alert and oriented to person, place, and time.  Skin: Skin is warm. No rash noted.  Psychiatric: She has a normal mood and affect. Her behavior is normal.  Nursing note and vitals reviewed.  The plan was reviewed with the patient/family, and all questions/concerned were addressed.  It was my pleasure to see Lindsay Hamilton today and participate in her care. Please feel free to contact me with any questions or concerns.  Sincerely,  Rexene Alberts, DO Allergy & Immunology  [facility] office: [phone] [facility] office: [place] office: [phone]

## 2019-03-17 NOTE — Assessment & Plan Note (Signed)
Whole body pruritus for the past few months. Denies changes in diet, medications or personal care products. Recent CMP per patient report was unremarkable.  Discussed that the most common cause of pruritus is usually xerosis.  Gave handout on proper skin care.  Will check CBC diff and thyroid.   Start zyrtec 10mg  daily and see if it helps with the itching.

## 2019-03-23 DIAGNOSIS — L299 Pruritus, unspecified: Secondary | ICD-10-CM | POA: Diagnosis not present

## 2019-03-23 DIAGNOSIS — J310 Chronic rhinitis: Secondary | ICD-10-CM | POA: Diagnosis not present

## 2019-03-26 LAB — CBC WITH DIFFERENTIAL/PLATELET
Basophils Absolute: 0 10*3/uL (ref 0.0–0.2)
Basos: 0 %
EOS (ABSOLUTE): 0.1 10*3/uL (ref 0.0–0.4)
Eos: 1 %
Hematocrit: 39.3 % (ref 34.0–46.6)
Hemoglobin: 13 g/dL (ref 11.1–15.9)
Immature Grans (Abs): 0 10*3/uL (ref 0.0–0.1)
Immature Granulocytes: 0 %
Lymphocytes Absolute: 3.5 10*3/uL — ABNORMAL HIGH (ref 0.7–3.1)
Lymphs: 39 %
MCH: 28.5 pg (ref 26.6–33.0)
MCHC: 33.1 g/dL (ref 31.5–35.7)
MCV: 86 fL (ref 79–97)
Monocytes Absolute: 0.5 10*3/uL (ref 0.1–0.9)
Monocytes: 5 %
Neutrophils Absolute: 4.8 10*3/uL (ref 1.4–7.0)
Neutrophils: 55 %
Platelets: 298 10*3/uL (ref 150–450)
RBC: 4.56 x10E6/uL (ref 3.77–5.28)
RDW: 12.5 % (ref 11.7–15.4)
WBC: 8.8 10*3/uL (ref 3.4–10.8)

## 2019-03-26 LAB — ALLERGENS W/TOTAL IGE AREA 2

## 2019-03-26 LAB — THYROID CASCADE PROFILE: TSH: 1.59 u[IU]/mL (ref 0.450–4.500)

## 2019-04-04 ENCOUNTER — Ambulatory Visit
Admission: RE | Admit: 2019-04-04 | Discharge: 2019-04-04 | Disposition: A | Discharge status: Home / Self Care | Payer: Federal, State, Local not specified - PPO | Source: Ambulatory Visit | Attending: Obstetrics and Gynecology | Admitting: Obstetrics and Gynecology

## 2019-04-04 ENCOUNTER — Other Ambulatory Visit: Payer: Self-pay

## 2019-04-04 DIAGNOSIS — Z1231 Encounter for screening mammogram for malignant neoplasm of breast: Secondary | ICD-10-CM | POA: Diagnosis not present

## 2019-04-13 DIAGNOSIS — Z01419 Encounter for gynecological examination (general) (routine) without abnormal findings: Secondary | ICD-10-CM | POA: Diagnosis not present

## 2019-04-13 DIAGNOSIS — Z6835 Body mass index (BMI) 35.0-35.9, adult: Secondary | ICD-10-CM | POA: Diagnosis not present

## 2019-04-15 ENCOUNTER — Encounter: Payer: Self-pay | Admitting: Family Medicine

## 2019-04-15 ENCOUNTER — Ambulatory Visit: Payer: Federal, State, Local not specified - PPO | Admitting: Family Medicine

## 2019-04-15 ENCOUNTER — Other Ambulatory Visit: Payer: Self-pay

## 2019-04-15 VITALS — BP 118/76 | HR 67 | Temp 98.4°F | Resp 18 | Ht 65.0 in | Wt 216.4 lb

## 2019-04-15 DIAGNOSIS — F418 Other specified anxiety disorders: Secondary | ICD-10-CM

## 2019-04-15 DIAGNOSIS — R7303 Prediabetes: Secondary | ICD-10-CM

## 2019-04-15 DIAGNOSIS — G4700 Insomnia, unspecified: Secondary | ICD-10-CM

## 2019-04-15 DIAGNOSIS — R198 Other specified symptoms and signs involving the digestive system and abdomen: Secondary | ICD-10-CM

## 2019-04-15 DIAGNOSIS — Z0000 Encounter for general adult medical examination without abnormal findings: Secondary | ICD-10-CM

## 2019-04-15 HISTORY — DX: Prediabetes: R73.03

## 2019-04-15 MED ORDER — ESCITALOPRAM OXALATE 20 MG PO TABS: 20.0000 mg | ORAL_TABLET | Freq: Every day | ORAL | 1 refills | Status: DC

## 2019-04-15 MED ORDER — CLONAZEPAM 0.5 MG PO TABS: ORAL_TABLET | ORAL | 0 refills | Status: DC

## 2019-04-15 MED ORDER — BUPROPION HCL ER (XL) 150 MG PO TB24
150.0000 mg | ORAL_TABLET | Freq: Every day | ORAL | 1 refills | Status: DC
Start: 2019-04-15 — End: 2019-07-07

## 2019-04-15 NOTE — Patient Instructions (Signed)
Pleasure to meet you today.  Follow up in 4 weeks on new regimen- can be virtual video if desired.  Stop lunesta. Stop xanax. Start klonopin 1 tab before bed and 1/2 tab if needed in the day. (every 12 hour med).   Please help [place] help you:  We are honored you have chosen [facility] for your Primary Care home. Below you will find basic instructions that you may need to access in the future. Please help [place] help you by reading the instructions, which cover many of the frequent questions we experience.   Prescription refills and request:  -In order to allow more efficient response time, please call your pharmacy for all refills. They will forward the request electronically to [place]. This allows for the quickest possible response. Request left on a nurse line can take longer to refill, since these are checked as time allows between office patients and other phone calls.  - refill request can take up to 3-5 working days to complete.  - If request is sent electronically and request is appropiate, it is usually completed in 1-2 business days.  - all patients will need to be seen routinely for all chronic medical conditions requiring prescription medications (see follow-up below). If you are overdue for follow up on your condition, you will be asked to make an appointment and we will call in enough medication to cover you until your appointment (up to 30 days).  - all controlled substances will require a face to face visit to request/refill.  - if you desire your prescriptions to go through a new pharmacy, and have an active script at original pharmacy, you will need to call your pharmacy and have scripts transferred to new pharmacy. This is completed between the pharmacy locations and not by your provider.    Results: If any images or labs were ordered, it can take up to 1 week to get results depending on the test ordered and the lab/facility running and resulting the test. - Normal or stable results,  which do not need further discussion, may be released to your mychart immediately with attached note to you. A call may not be generated for normal results. Please make certain to sign up for mychart. If you have questions on how to activate your mychart you can call the front office.  - If your results need further discussion, our office will attempt to contact you via phone, and if unable to reach you after 2 attempts, we will release your abnormal result to your mychart with instructions.  - All results will be automatically released in mychart after 1 week.  - Your provider will provide you with explanation and instruction on all relevant material in your results. Please keep in mind, results and labs may appear confusing or abnormal to the untrained eye, but it does not mean they are actually abnormal for you personally. If you have any questions about your results that are not covered, or you desire more detailed explanation than what was provided, you should make an appointment with your provider to do so.   Our office handles many outgoing and incoming calls daily. If we have not contacted you within 1 week about your results, please check your mychart to see if there is a message first and if not, then contact our office.  In helping with this matter, you help decrease call volume, and therefore allow [place] to be able to respond to patients needs more efficiently.   Acute office  visits (sick visit):  An acute visit is intended for a new problem and are scheduled in shorter time slots to allow schedule openings for patients with new problems. This is the appropriate visit to discuss a new problem. Problems will not be addressed by phone call or Echart message. Appointment is needed if requesting treatment. In order to provide you with excellent quality medical care with proper time for you to explain your problem, have an exam and receive treatment with instructions, these appointments should be limited  to one new problem per visit. If you experience a new problem, in which you desire to be addressed, please make an acute office visit, we save openings on the schedule to accommodate you. Please do not save your new problem for any other type of visit, let us take care of it properly and quickly for you.   Follow up visits:  Depending on your condition(s) your provider will need to see you routinely in order to provide you with quality care and prescribe medication(s). Most chronic conditions (Example: hypertension, Diabetes, depression/anxiety... etc), require visits a couple times a year. Your provider will instruct you on proper follow up for your personal medical conditions and history. Please make certain to make follow up appointments for your condition as instructed. Failing to do so could result in lapse in your medication treatment/refills. If you request a refill, and are overdue to be seen on a condition, we will always provide you with a 30 day script (once) to allow you time to schedule.    Medicare wellness (well visit): - we have a wonderful Nurse [name]), that will meet with you and provide you will yearly medicare wellness visits. These visits should occur yearly (can not be scheduled less than 1 calendar year apart) and cover preventive health, immunizations, advance directives and screenings you are entitled to yearly through your medicare benefits. Do not miss out on your entitled benefits, this is when medicare will pay for these benefits to be ordered for you.  These are strongly encouraged by your provider and is the appropriate type of visit to make certain you are up to date with all preventive health benefits. If you have not had your medicare wellness exam in the last 12 months, please make certain to schedule one by calling the office and schedule your medicare wellness with [name] as soon as possible.   Yearly physical (well visit):  - Adults are recommended to be seen yearly for  physicals. Check with your insurance and date of your last physical, most insurances require one calendar year between physicals. Physicals include all preventive health topics, screenings, medical exam and labs that are appropriate for gender/age and history. You may have fasting labs needed at this visit. This is a well visit (not a sick visit), new problems should not be covered during this visit (see acute visit).  - Pediatric patients are seen more frequently when they are younger. Your provider will advise you on well child visit timing that is appropriate for your their age. - This is not a medicare wellness visit. Medicare wellness exams do not have an exam portion to the visit. Some medicare companies allow for a physical, some do not allow a yearly physical. If your medicare allows a yearly physical you can schedule the medicare wellness with our nurse [name] and have your physical with your provider after, on the same day. Please check with insurance for your full benefits.   Late Policy/No Shows:  - all  new patients should arrive 15-30 minutes earlier than appointment to allow [place] time  to  obtain all personal demographics,  insurance information and for you to complete office paperwork. - All established patients should arrive 10-15 minutes earlier than appointment time to update all information and be checked in .  - In our best efforts to run on time, if you are late for your appointment you will be asked to either reschedule or if able, we will work you back into the schedule. There will be a wait time to work you back in the schedule,  depending on availability.  - If you are unable to make it to your appointment as scheduled, please call 24 hours ahead of time to allow [place] to fill the time slot with someone else who needs to be seen. If you do not cancel your appointment ahead of time, you may be charged a no show fee.

## 2019-04-15 NOTE — Progress Notes (Signed)
Patient ID: Lindsay Hamilton, female  DOB: Apr 23, 1966, 53 y.o.   MRN: [id number] Patient Care Team    Relationship Specialty Notifications Start End  [name], DO PCP - General Family Medicine  04/15/19   Abbie Sons, MD Referring Physician Psychiatry  09/14/13   Garnet Sierras, DO Consulting Physician Allergy  03/17/19   [name], DO Consulting Physician Family Medicine  04/15/19 04/15/19  [name], DO Consulting Physician Family Medicine  04/15/19 04/15/19  Clarene Essex, MD Consulting Physician Gastroenterology  04/15/19   Vickey Huger, MD Consulting Physician Orthopedic Surgery  04/15/19   Donia Ast, [place]  Orthopedic Surgery  04/15/19   Roel Cluck, MD Referring Physician Ophthalmology  04/15/19   Lelon Perla, MD Consulting Physician Cardiology  04/15/19   Kathrynn Ducking, MD Consulting Physician Neurology  04/15/19   Arvella Nigh, MD Consulting Physician Obstetrics and Gynecology  04/15/19   Juluis Rainier  Optometry  04/15/19   Clabe Seal, DMD  Orthodontics  04/15/19     Chief Complaint  Patient presents with  . Establish Care    Subjective:  Lindsay Hamilton is a 53 y.o.  female present for new patient establishment. All past medical history, surgical history, allergies, family history, immunizations, medications and social history were updated in the electronic medical record today. All recent labs, ED visits and hospitalizations within the last year were reviewed.  Insomnia, unspecified type/Depression with anxiety Patient reports she used to be on Ambien for her insomnia however she was experiencing some bizarre behavioral side effects and it was switched over to Tina which she has been on for a couple years.  She states it seems to work well for her.  She has also been prescribed Lexapro which she stated is been for a very long time.  Wellbutrin was added on approximately 08/13/14 when her mother passed away.  She  states she is also been on Xanax for quite some time at 0.25 mg twice daily as needed.  She states the regimen seems to work okay for her.  She has noticed an increased blood pressure on a few occasions, especially last night.  She does not believe it is related to stress.  Change in bowel movement Patient reports she is not certain if she should be concerned that she has bowel movements approximately 3-4 times a week.  She reports this is basically unchanged from prior history of her bowel movements.  She denies straining or or hard stools.  She denies any melena or hematochezia.   Immunization History  Administered Date(s) Administered  . Influenza-Unspecified 02/16/2019  . Tdap 04/08/2013    No exam data present  Past Medical History:  Diagnosis Date  . Alcohol abuse, in remission   . Depression   . Head injury   . Headaches due to old head injury   . Insomnia 09/14/2013  . Migraine without aura, without mention of intractable migraine without mention of status migrainosus 09/14/2013  . Obese   . Pancreatitis   . Pseudoseizures   . Tubular adenoma of colon 12-Aug-2016   No Known Allergies Past Surgical History:  Procedure Laterality Date  . ABDOMINAL HYSTERECTOMY  2007/08/13   Partial  . CARPAL TUNNEL RELEASE  1998  . CESAREAN SECTION     x2-1992, 1997  . CHOLECYSTECTOMY  08-12-2000  . COLONOSCOPY W/ BIOPSIES  05/2016   tubular adenoma  . GREAT TOE ARTHRODESIS, METATARSALPHALANGEAL JOINT Right 07/17/2018  .  KNEE ARTHROSCOPY     bilateral, 3 right and 3 left  . SHOULDER ARTHROSCOPY Bilateral    Right 2012, left 2014   Family History  Problem Relation Age of Onset  . Hypertension Mother   . CAD Mother        Died of MI at age 86  . Asthma Mother   . Pulmonary embolism Mother        x2  . Cancer - Prostate Father   . Atrial fibrillation Father   . Hyperlipidemia Father   . Seizures Sister   . Migraines Sister   . Hypertension Brother   . Hypertension Brother   . Lung cancer  Paternal Grandmother   . Breast cancer Neg Hx   . Allergic rhinitis Neg Hx   . Eczema Neg Hx   . Urticaria Neg Hx    Social History   Social History Narrative   Marital status/children/pets: Married.  2 children.   Education/employment: High school graduate with some college.  Works as a Stage manager.   Safety:      -smoke alarm in the home:Yes     - wears seatbelt: Yes     - Feels safe in their relationships: Yes    Allergies as of 04/15/2019   No Known Allergies     Medication List       Accurate as of April 15, 2019  6:30 PM. If you have any questions, ask your nurse or doctor.        STOP taking these medications   ALPRAZolam 0.25 MG tablet Commonly known as: XANAX Stopped by: Howard Pouch, DO   Eszopiclone 3 MG Tabs Stopped by: Howard Pouch, DO     TAKE these medications   albuterol 108 (90 Base) MCG/ACT inhaler Commonly known as: VENTOLIN HFA Inhale 1-2 puffs into the lungs every 6 (six) hours as needed for wheezing or shortness of breath (every 4-6 hours as needed for wheezing and coughing).   aspirin-acetaminophen-caffeine 250-250-65 MG tablet Commonly known as: EXCEDRIN MIGRAINE Take by mouth every 6 (six) hours as needed for headache.   bisacodyl 5 MG EC tablet Commonly known as: DULCOLAX Take 5 mg by mouth daily as needed for moderate constipation.   budesonide-formoterol 80-4.5 MCG/ACT inhaler Commonly known as: Symbicort Inhale 2 puffs into the lungs 2 (two) times daily.   buPROPion 150 MG 24 hr tablet Commonly known as: WELLBUTRIN XL Take 1 tablet (150 mg total) by mouth daily. What changed: See the new instructions. Changed by: Howard Pouch, DO   cetirizine 10 MG tablet Commonly known as: ZYRTEC Take 10 mg by mouth daily.   clonazePAM 0.5 MG tablet Commonly known as: KLONOPIN 1 tab QHS and 0.5 mg in morning PRN Started by: Howard Pouch, DO   [place] PO Take 1 capsule by mouth daily.    escitalopram 20 MG tablet Commonly known as: LEXAPRO Take 1 tablet (20 mg total) by mouth at bedtime.   FIBER SELECT GUMMIES PO Take by mouth.   naproxen sodium 220 MG tablet Commonly known as: ALEVE Take 220 mg by mouth. 4 times daily for knee pain   NON FORMULARY [employer]   PAPAYA ENZYME PO Take 1 capsule by mouth daily as needed (upset stomach).   polyethylene glycol 17 g packet Commonly known as: MIRALAX / GLYCOLAX Take 17 g by mouth daily.   Spacer/Aero-Holding [employer] Use with Symbicort PRN   VITAMIN D3 PO Take by mouth.   Zinc 50  MG Tabs Take by mouth.       All past medical history, surgical history, allergies, family history, immunizations andmedications were updated in the EMR today and reviewed under the history and medication portions of their EMR.      ROS: 14 pt review of systems performed and negative (unless mentioned in an HPI)  Objective: BP 118/76 (BP Location: Left Arm, Patient Position: Sitting, Cuff Size: Normal)   Pulse 67   Temp 98.4 F (36.9 C) (Temporal)   Resp 18   Ht 5\' 5"  (1.651 m)   Wt 216 lb 6.4 oz (98.2 kg)   SpO2 96%   BMI 36.01 kg/m  Gen: Afebrile. No acute distress. Nontoxic in appearance, well-developed, well-nourished, very pleasant Caucasian female. HENT: AT. [place].  Eyes:Pupils Equal Round Reactive to light, Extraocular movements intact,  Conjunctiva without redness, discharge or icterus. Neck/lymp/endocrine: Supple CV: RRR very soft 1/6 systolic murmur Chest: CTAB, no wheeze, rhonchi or crackles.  Skin: Warm and well-perfused. Skin intact. Neuro/Msk: Normal gait. PERLA. EOMi. Alert. Oriented x3.  Psych: Normal affect, dress and demeanor. Normal speech. Normal thought content and judgment.   Assessment/plan: Kellsie Gerl is a 53 y.o. female present for est Insomnia, unspecified type/Depression with anxiety Discussed patient's prior medications and current regimen with her in detail today.  She is on a  short acting benzodiazepine, as well as to SSRIs and a sleep aid.  Made some suggestions to her today on attempting to control anxiety a little better and consider getting rid of one of her medications and maximizing the use of benzodiazepine if she requires them anyway.  She is agreeable to this. Discontinue Lunesta Discontinue Xanax Start Klonopin 0.5 mg nightly and may use half a tab 12 hours later if needed only. Continue and refilled her Wellbutrin 150 daily and her Lexapro 20 mg daily [place] controlled substance database reviewed today and appropriate. We will have patient sign controlled substance contract on next visit once dose of Klonopin is stable.  Patient counseled on addictive properties of benzodiazepines. Close follow-up in 3-1/2 weeks in which we can work with the Klonopin dose if needed.  Once doses are stable, every 6 months face-to-face visit required for refills of controlled substances.  Change in bowel movement Reassurance as long as she is not seeing any bowel habit changes and she is using the bathroom at least 3 times a week without straining this is still within normal parameters. Would encourage her to increase her fluid/water consumption.  Greater than 45 minutes was spent with patient, greater than 50% of that time was spent face-to-face    Note is dictated utilizing voice recognition software. Although note has been proof read prior to signing, occasional typographical errors still can be missed. If any questions arise, please do not hesitate to call for verification.  Electronically signed by: Howard Pouch, DO [place]

## 2019-04-19 ENCOUNTER — Encounter: Payer: Self-pay | Admitting: Family Medicine

## 2019-04-21 ENCOUNTER — Other Ambulatory Visit: Payer: Self-pay

## 2019-04-21 ENCOUNTER — Ambulatory Visit: Payer: Federal, State, Local not specified - PPO | Admitting: Allergy

## 2019-04-21 ENCOUNTER — Encounter: Payer: Self-pay | Admitting: Allergy

## 2019-04-21 VITALS — BP 130/88 | HR 89 | Temp 97.1°F | Resp 17

## 2019-04-21 DIAGNOSIS — J4540 Moderate persistent asthma, uncomplicated: Secondary | ICD-10-CM | POA: Diagnosis not present

## 2019-04-21 DIAGNOSIS — J310 Chronic rhinitis: Secondary | ICD-10-CM

## 2019-04-21 DIAGNOSIS — L299 Pruritus, unspecified: Secondary | ICD-10-CM | POA: Diagnosis not present

## 2019-04-21 NOTE — Progress Notes (Signed)
Follow Up Note  RE: Lindsay Hamilton MRN: [id number] DOB: 28-Oct-1965 Date of Office Visit: 04/21/2019  Referring provider: Orpah Melter, MD Primary care provider: [name], DO  Chief Complaint: Allergic Rhinitis   History of Present Illness: I had the pleasure of seeing Lindsay Hamilton for a follow up visit at the [facility] on 04/21/2019. She is a 53 y.o. female, who is being followed for coughing, chronic rhinitis, pruritus. Her previous allergy office visit was on 03/17/2019 with Dr. [name]. Today is a regular follow up visit.   Coughing Coughing is much improved with Symbicort 80 2 puffs twice a day with spacer.  Used albuterol 4 times since the last visit - usually during a coughing fit and albuterol does help these episodes.   Otherwise denies any SOB, coughing, wheezing, chest tightness, nocturnal awakenings, ER/urgent care visits or prednisone use since the last visit.  Chronic rhinitis/pruritus Asymptomatic and takes zyrtec 10mg  daily which also is helping her pruritus.   Assessment and Plan: Greylynn is a 53 y.o. female with: Reactive airway disease Past history - Coughing, wheezing, SOB for the past 3 yeas in January and October for a few months at a time. No specific triggers noted but exertion does make it worse. 2 courses of oral prednisone this year with some benefit. Norma CXR in October 2020. Denies heartburn/reflux/PND. 2020 spirometry showed mild restriction with no improvement in FEV1 post bronchodilator treatment.  Interim history - doing much better with Symbicort. Albuterol does alleviate coughing fits.  Daily controller medication(s): continue Symbicort 80 2 puffs twice a day with spacer and rinse mouth afterwards. Prior to physical activity: May use albuterol rescue inhaler 2 puffs 5 to 15 minutes prior to strenuous physical activities. Rescue medications: May use albuterol rescue inhaler 2 puffs or nebulizer every 4 to 6 hours as  needed for shortness of breath, chest tightness, coughing, and wheezing. Monitor frequency of use.   Chronic rhinitis Past history - Rhino conjunctivitis symptoms in the winter, spring and fall for the past 3 years. She had skin testing over 10 years which was positive to grass, mold, trees, cats and dogs per patient report. 2020 skin testing and immunocap was negative to environmental allergies. Interim history - asymptomatic.   May try to stop zyrtec and monitor symptoms.   Pruritus Past history - Whole body pruritus for the past few months. Denies changes in diet, medications or personal care products.  Interim history - Much better with proper skin care and not sure if zyrtec helping or not.   Continue proper skin care.  Stop zyrtec and see if pruritus returns.   Return in about 3 months (around 07/20/2019).  Diagnostics: Spirometry:  Tracings reviewed. Her effort: Good reproducible efforts. FVC: 3.10L FEV1: 2.65L, 89% predicted FEV1/FVC ratio: 85% Interpretation: Spirometry consistent with normal pattern.  Please see scanned spirometry results for details.  Medication List:  Current Outpatient Medications  Medication Sig Dispense Refill  . albuterol (VENTOLIN HFA) 108 (90 Base) MCG/ACT inhaler Inhale 1-2 puffs into the lungs every 6 (six) hours as needed for wheezing or shortness of breath (every 4-6 hours as needed for wheezing and coughing). 18 g 2  . aspirin-acetaminophen-caffeine (EXCEDRIN MIGRAINE) [id number] MG tablet Take by mouth every 6 (six) hours as needed for headache.    . bisacodyl (DULCOLAX) 5 MG EC tablet Take 5 mg by mouth daily as needed for moderate constipation.    . budesonide-formoterol (SYMBICORT) 80-4.5 MCG/ACT inhaler Inhale 2  puffs into the lungs 2 (two) times daily. 1 Inhaler 3  . buPROPion (WELLBUTRIN XL) 150 MG 24 hr tablet Take 1 tablet (150 mg total) by mouth daily. 90 tablet 1  . cetirizine (ZYRTEC) 10 MG tablet Take 10 mg by mouth daily.    .  Cholecalciferol (VITAMIN D3 PO) Take by mouth.    . clonazePAM (KLONOPIN) 0.5 MG tablet 1 tab QHS and 0.5 mg in morning PRN 45 tablet 0  . escitalopram (LEXAPRO) 20 MG tablet Take 1 tablet (20 mg total) by mouth at bedtime. 90 tablet 1  . FIBER SELECT GUMMIES PO Take by mouth.    . Misc Natural Products (COSAMIN VERDE FOR JOINT HEALTH PO) Take 1 capsule by mouth daily.    . naproxen sodium (ALEVE) 220 MG tablet Take 220 mg by mouth. 4 times daily for knee pain    . NON FORMULARY [employer]    . PAPAYA ENZYME PO Take 1 capsule by mouth daily as needed (upset stomach).    . polyethylene glycol (MIRALAX / GLYCOLAX) 17 g packet Take 17 g by mouth daily.    Marland Kitchen Spacer/Aero-Holding [employer] Use with Symbicort PRN 1 Device 0  . Zinc 50 MG TABS Take by mouth.     No current facility-administered medications for this visit.   Allergies: No Known Allergies I reviewed her past medical history, social history, family history, and environmental history and no significant changes have been reported from her previous visit.  Review of Systems  Constitutional: Negative for appetite change, chills, fever and unexpected weight change.  HENT: Negative for congestion and rhinorrhea.   Eyes: Negative for itching.  Respiratory: Positive for cough. Negative for chest tightness, shortness of breath and wheezing.   Cardiovascular: Negative for chest pain.  Gastrointestinal: Negative for abdominal pain.  Genitourinary: Negative for difficulty urinating.  Skin: Negative for rash.  Allergic/Immunologic: Negative for environmental allergies and food allergies.  Neurological: Positive for headaches.   Objective: BP 130/88 (BP Location: Left Arm, Patient Position: Sitting, Cuff Size: Normal)   Pulse 89   Temp (!) 97.1 F (36.2 C) (Temporal)   Resp 17   SpO2 96%  There is no height or weight on file to calculate BMI. Physical Exam  Constitutional: She is oriented to person, place, and time. She appears  well-developed and well-nourished.  HENT:  Head: Normocephalic and atraumatic.  Right Ear: External ear normal.  Left Ear: External ear normal.  Nose: Nose normal.  Mouth/Throat: Oropharynx is clear and moist.  Eyes: Conjunctivae and EOM are normal.  Cardiovascular: Normal rate, regular rhythm and normal heart sounds. Exam reveals no gallop and no friction rub.  No murmur heard. Pulmonary/Chest: Effort normal and breath sounds normal. She has no wheezes. She has no rales.  Abdominal: Soft.  Musculoskeletal:     Cervical back: Neck supple.  Neurological: She is alert and oriented to person, place, and time.  Skin: Skin is warm. No rash noted.  Psychiatric: She has a normal mood and affect. Her behavior is normal.  Nursing note and vitals reviewed.  Previous notes and tests were reviewed. The plan was reviewed with the patient/family, and all questions/concerned were addressed.  It was my pleasure to see Yasheka today and participate in her care. Please feel free to contact me with any questions or concerns.  Sincerely,  Rexene Alberts, DO Allergy & Immunology  [facility] office: [phone] [facility] office: [place] office: [phone]

## 2019-04-21 NOTE — Assessment & Plan Note (Signed)
Past history - Rhino conjunctivitis symptoms in the winter, spring and fall for the past 3 years. She had skin testing over 10 years which was positive to grass, mold, trees, cats and dogs per patient report. 2020 skin testing and immunocap was negative to environmental allergies. Interim history - asymptomatic.   May try to stop zyrtec and monitor symptoms.

## 2019-04-21 NOTE — Assessment & Plan Note (Signed)
Past history - Whole body pruritus for the past few months. Denies changes in diet, medications or personal care products.  Interim history - Much better with proper skin care and not sure if zyrtec helping or not.   Continue proper skin care.  Stop zyrtec and see if pruritus returns.

## 2019-04-21 NOTE — Assessment & Plan Note (Signed)
Past history - Coughing, wheezing, SOB for the past 3 yeas in January and October for a few months at a time. No specific triggers noted but exertion does make it worse. 2 courses of oral prednisone this year with some benefit. Norma CXR in October 2020. Denies heartburn/reflux/PND. 2020 spirometry showed mild restriction with no improvement in FEV1 post bronchodilator treatment.  Interim history - doing much better with Symbicort. Albuterol does alleviate coughing fits.  Daily controller medication(s): continue Symbicort 80 2 puffs twice a day with spacer and rinse mouth afterwards. Prior to physical activity: May use albuterol rescue inhaler 2 puffs 5 to 15 minutes prior to strenuous physical activities. Rescue medications: May use albuterol rescue inhaler 2 puffs or nebulizer every 4 to 6 hours as needed for shortness of breath, chest tightness, coughing, and wheezing. Monitor frequency of use.

## 2019-04-21 NOTE — Patient Instructions (Addendum)
Daily controller medication(s): continue Symbicort 80 2 puffs twice a day with spacer and rinse mouth afterwards. Prior to physical activity: May use albuterol rescue inhaler 2 puffs 5 to 15 minutes prior to strenuous physical activities. Rescue medications: May use albuterol rescue inhaler 2 puffs or nebulizer every 4 to 6 hours as needed for shortness of breath, chest tightness, coughing, and wheezing. Monitor frequency of use.  Breathing control goals:  Full participation in all desired activities (may need albuterol before activity) Albuterol use two times or less a week on average (not counting use with activity) Cough interfering with sleep two times or less a month Oral steroids no more than once a year No hospitalizations  Chronic rhinitis/pruritus  Stop zyrtec and see if you have any worsening symptoms.   Continue proper skin care.  Follow up in 3 months or sooner if needed.   Skin care recommendations  Bath time: . Always use lukewarm water. AVOID very hot or cold water. Marland Kitchen Keep bathing time to 5-10 minutes. . Do NOT use bubble bath. . Use a mild soap and use just enough to wash the dirty areas. . Do NOT scrub skin vigorously.  . After bathing, pat dry your skin with a towel. Do NOT rub or scrub the skin.  Moisturizers and prescriptions:  . ALWAYS apply moisturizers immediately after bathing (within 3 minutes). This helps to lock-in moisture. . Use the moisturizer several times a day over the whole body. Kermit Balo summer moisturizers include: Aveeno, CeraVe, Cetaphil. Kermit Balo winter moisturizers include: Aquaphor, Vaseline, Cerave, Cetaphil, Eucerin, Vanicream. . When using moisturizers along with medications, the moisturizer should be applied about one hour after applying the medication to prevent diluting effect of the medication or moisturize around where you applied the medications. When not using medications, the moisturizer can be continued twice daily as  maintenance.  Laundry and clothing: . Avoid laundry products with added color or perfumes. . Use unscented hypo-allergenic laundry products such as Tide free, Cheer free & gentle, and All free and clear.  . If the skin still seems dry or sensitive, you can try double-rinsing the clothes. . Avoid tight or scratchy clothing such as wool. . Do not use fabric softeners or dyer sheets.

## 2019-05-04 DIAGNOSIS — Z03818 Encounter for observation for suspected exposure to other biological agents ruled out: Secondary | ICD-10-CM | POA: Diagnosis not present

## 2019-05-04 DIAGNOSIS — Z20828 Contact with and (suspected) exposure to other viral communicable diseases: Secondary | ICD-10-CM | POA: Diagnosis not present

## 2019-05-11 ENCOUNTER — Other Ambulatory Visit: Payer: Self-pay

## 2019-05-11 ENCOUNTER — Encounter: Payer: Self-pay | Admitting: Family Medicine

## 2019-05-11 ENCOUNTER — Ambulatory Visit (INDEPENDENT_AMBULATORY_CARE_PROVIDER_SITE_OTHER): Payer: Federal, State, Local not specified - PPO | Admitting: Family Medicine

## 2019-05-11 VITALS — Temp 97.8°F | Ht 65.0 in | Wt 212.0 lb

## 2019-05-11 DIAGNOSIS — G4700 Insomnia, unspecified: Secondary | ICD-10-CM | POA: Diagnosis not present

## 2019-05-11 DIAGNOSIS — F418 Other specified anxiety disorders: Secondary | ICD-10-CM | POA: Diagnosis not present

## 2019-05-11 DIAGNOSIS — R109 Unspecified abdominal pain: Secondary | ICD-10-CM | POA: Diagnosis not present

## 2019-05-11 MED ORDER — CLONAZEPAM 0.5 MG PO TABS: ORAL_TABLET | ORAL | 5 refills | Status: DC

## 2019-05-11 NOTE — Progress Notes (Signed)
VIRTUAL VISIT VIA VIDEO  I connected with Elmon Else Stroupe on 05/11/19 at  1:00 PM EST by a video enabled telemedicine application and verified that I am speaking with the correct person using two identifiers. Location patient: Home Location provider: [facility], Office Persons participating in the virtual visit: Patient, Dr. Raoul Pitch and R.Baker, LPN  I discussed the limitations of evaluation and management by telemedicine and the availability of in person appointments. The patient expressed understanding and agreed to proceed.  Patient ID: Lindsay Hamilton, female  DOB: [birth year], 54 y.o.   MRN: [id number] Patient Care Team    Relationship Specialty Notifications Start End  [name], DO PCP - General Family Medicine  04/15/19   Abbie Sons, MD Referring Physician Psychiatry  09/14/13   Garnet Sierras, DO Consulting Physician Allergy  03/17/19   Clarene Essex, MD Consulting Physician Gastroenterology  04/15/19   Vickey Huger, MD Consulting Physician Orthopedic Surgery  04/15/19   Donia Ast, [place]  Orthopedic Surgery  04/15/19   Roel Cluck, MD Referring Physician Ophthalmology  04/15/19   Lelon Perla, MD Consulting Physician Cardiology  04/15/19   Kathrynn Ducking, MD Consulting Physician Neurology  04/15/19   Arvella Nigh, MD Consulting Physician Obstetrics and Gynecology  04/15/19   Juluis Rainier  Optometry  04/15/19   Clabe Seal, DMD  Orthodontics  04/15/19     Chief Complaint  Patient presents with  . Depression    Pt is crying more and has a short temper since starting medicaton. Pt has had a increase in stomach cramping since starting medication.   Marland Kitchen Anxiety    Subjective:  Lindsay Hamilton is a 54 y.o.  female present for chronic medical condition follow-up after changing medication regimen.  Insomnia, unspecified type/Depression with anxiety Patient reports overall she is doing well since the changes in  medication.  She is sleeping well with using the Klonopin 0.5 mg nightly.  She has not used the [place] and feels she is getting good sleep with the Klonopin.  She does endorse having some increase in her emotional state, feeling more tearful at times since stopping the Lunesta and the Xanax.  She has been taking the Xanax twice daily.  However she has only been taking the Klonopin at night.  It was written to allow her to have 1 Klonopin before bed and a half a tab during the day.  She states she has not been taking the Klonopin during the day. Prior note: Patient reports she used to be on Ambien for her insomnia however she was experiencing some bizarre behavioral side effects and it was switched over to Aubrey which she has been on for a couple years.  She states it seems to work well for her.  She has also been prescribed Lexapro which she stated is been for a very long time.  Wellbutrin was added on approximately 08-25-2014 when her mother passed away.  She states she is also been on Xanax for quite some time at 0.25 mg twice daily as needed.  She states the regimen seems to work okay for her.  She has noticed an increased blood pressure on a few occasions, especially last night.  She does not believe it is related to stress.  Change in bowel movement Patient today reports that she is having "severe cramping "for the last few weeks.  She endorses a small amount of blood on her toilet tissue when  wiping yesterday.  4 weeks ago she states that she was having approximately 3-4 bowel movements a week, and that was unchanged from her prior history of bowel movements.  She denies straining or hard stools.  She reports having the same problem last year around this time and saw her gastroenterologist.  She reports they never found a cause for her symptoms and her symptoms resolved on their own.  Patient denies fever, chills, nausea or vomiting.  He has no known history of diverticulosis.  She has had 1 colonoscopy in  2018.   Immunization History  Administered Date(s) Administered  . Influenza-Unspecified 02/16/2019  . Tdap 04/08/2013    No exam data present  Past Medical History:  Diagnosis Date  . Alcohol abuse, in remission   . Depression   . Head injury   . Headaches due to old head injury   . Insomnia 09/14/2013  . Migraine without aura, without mention of intractable migraine without mention of status migrainosus 09/14/2013  . Obese   . Pancreatitis   . Pseudoseizures   . Tubular adenoma of colon 2018   No Known Allergies Past Surgical History:  Procedure Laterality Date  . ABDOMINAL HYSTERECTOMY  2009   Partial  . CARPAL TUNNEL RELEASE  1998  . CESAREAN SECTION     x2-1992, 1997  . CHOLECYSTECTOMY  2002  . COLONOSCOPY W/ BIOPSIES  05/2016   tubular adenoma  . GREAT TOE ARTHRODESIS, METATARSALPHALANGEAL JOINT Right 07/17/2018  . KNEE ARTHROSCOPY     bilateral, 3 right and 3 left  . SHOULDER ARTHROSCOPY Bilateral    Right 2012, left 2014   Family History  Problem Relation Age of Onset  . Hypertension Mother   . CAD Mother        Died of MI at age 42  . Asthma Mother   . Pulmonary embolism Mother        x2  . Cancer - Prostate Father   . Atrial fibrillation Father   . Hyperlipidemia Father   . Seizures Sister   . Migraines Sister   . Hypertension Brother   . Hypertension Brother   . Lung cancer Paternal Grandmother   . Breast cancer Neg Hx   . Allergic rhinitis Neg Hx   . Eczema Neg Hx   . Urticaria Neg Hx    Social History   Social History Narrative   Marital status/children/pets: Married.  2 children.   Education/employment: High school graduate with some college.  Works as a Stage manager.   Safety:      -smoke alarm in the home:Yes     - wears seatbelt: Yes     - Feels safe in their relationships: Yes    Allergies as of 05/11/2019   No Known Allergies     Medication List       Accurate as of May 11, 2019  1:04 PM. If you  have any questions, ask your nurse or doctor.        albuterol 108 (90 Base) MCG/ACT inhaler Commonly known as: VENTOLIN HFA Inhale 1-2 puffs into the lungs every 6 (six) hours as needed for wheezing or shortness of breath (every 4-6 hours as needed for wheezing and coughing).   aspirin-acetaminophen-caffeine 250-250-65 MG tablet Commonly known as: EXCEDRIN MIGRAINE Take by mouth every 6 (six) hours as needed for headache.   bisacodyl 5 MG EC tablet Commonly known as: DULCOLAX Take 5 mg by mouth daily as needed for moderate constipation.   budesonide-formoterol 80-4.5  MCG/ACT inhaler Commonly known as: Symbicort Inhale 2 puffs into the lungs 2 (two) times daily.   buPROPion 150 MG 24 hr tablet Commonly known as: WELLBUTRIN XL Take 1 tablet (150 mg total) by mouth daily.   cetirizine 10 MG tablet Commonly known as: ZYRTEC Take 10 mg by mouth daily.   clonazePAM 0.5 MG tablet Commonly known as: KLONOPIN 1 tab QHS and 0.5 mg in morning PRN   COSAMIN VERDE FOR JOINT HEALTH PO Take 1 capsule by mouth daily.   escitalopram 20 MG tablet Commonly known as: LEXAPRO Take 1 tablet (20 mg total) by mouth at bedtime.   FIBER SELECT GUMMIES PO Take by mouth.   naproxen sodium 220 MG tablet Commonly known as: ALEVE Take 220 mg by mouth. 4 times daily for knee pain   NON FORMULARY [employer]   PAPAYA ENZYME PO Take 1 capsule by mouth daily as needed (upset stomach).   polyethylene glycol 17 g packet Commonly known as: MIRALAX / GLYCOLAX Take 17 g by mouth daily.   Spacer/Aero-Holding [employer] Use with Symbicort PRN   VITAMIN D3 PO Take by mouth.   Zinc 50 MG Tabs Take by mouth.       All past medical history, surgical history, allergies, family history, immunizations andmedications were updated in the EMR today and reviewed under the history and medication portions of their EMR.      ROS: 14 pt review of systems performed and negative (unless mentioned in an  HPI)  Objective: Temp 97.8 F (36.6 C) (Temporal)   Ht 5\' 5"  (1.651 m)   Wt 212 lb (96.2 kg)   BMI 35.28 kg/m  Gen: Afebrile. No acute distress.  Pleasant Caucasian female.  Appears well today. HENT: AT. [place].  Neuro: PERLA. EOMi. Alert. Oriented x3 Psych: Normal affect, dress and demeanor. Normal speech. Normal thought content and judgment.  Assessment/plan: Iveth Hutchison is a 54 y.o. female present for est Insomnia, unspecified type/Depression with anxiety Discussed patient's prior medications and current regimen with her in detail today.  She is on a short acting benzodiazepine, as well as to SSRIs and a sleep aid.  Made some suggestions to her today on attempting to control anxiety a little better and consider getting rid of one of her medications and maximizing the use of benzodiazepine if she requires them anyway.  She is agreeable to this. Discontinued Lunesta and Xanax last visit, patient doing well. Continue Klonopin 0.5 mg nightly and use half a tab 12 hours later if needed only.  Refilled this for her today. Continue Wellbutrin 150 daily and her Lexapro 20 mg daily.  Discussed if she feels she needs more coverage after increasing the Klonopin as we discussed, we can increase her Wellbutrin as well. [place] controlled substance database reviewed 05/11/19  and appropriate. We will have patient sign controlled substance contract on next visit once dose of Klonopin is stable and visit is in person.  Patient counseled on addictive properties of benzodiazepines. Follow-up 5-6 months, sooner if needed.  Abdominal cramping: Similar occurrence last year around this time that self resolved.  Encouraged her to hydrate, ensure she is having soft but formed stools.  Increase fiber in her diet.  Monitor for fever, chills, or continued rectal bleeding. Her last colonoscopy was 2018, her symptoms could be secondary to diverticulosis/diverticulitis that has formed since her  colonoscopy.  She was given emergent precautions.  If symptoms continue, would have her follow-up urgently or call her gastroenterology's office to  be seen.  Patient reported understanding.  Note is dictated utilizing voice recognition software. Although note has been proof read prior to signing, occasional typographical errors still can be missed. If any questions arise, please do not hesitate to call for verification.  Electronically signed by: Howard Pouch, DO [place]

## 2019-05-12 ENCOUNTER — Telehealth: Payer: Self-pay

## 2019-05-12 NOTE — Telephone Encounter (Signed)
Patient was seen yesterday for virtual visit for her depression/insomnia/anxiety.  She mentioned her abdominal discomfort during that visit and she was told if she is having worsening symptoms she was to be seen for this condition so that we can further evaluate.   It is not known that she has diverticulitis-I suspect it is a possibility.  She can be seen either here or with her GI-Per office note yesterday.  I do not recommend calling in antibiotics for a diagnosis we are uncertain of without proper evaluation and work-up.

## 2019-05-12 NOTE — Telephone Encounter (Signed)
I will have patient call GI MD to get appt and to F/U, should patient ask them about the Antibiotic? Or should patient schedule another Visit with you? Pt was seen here on 01/06 for this issue. Please advise.

## 2019-05-12 NOTE — Telephone Encounter (Signed)
Patient is inquiring about getting an antibiotic for diverticulitis. Patient uses [facility].

## 2019-05-12 NOTE — Telephone Encounter (Signed)
Pt was scheduled for 4pm tomorrow with Dr Raoul Pitch

## 2019-05-12 NOTE — Telephone Encounter (Signed)
Pt was called and she would like to be seen by Dr Raoul Pitch asap. She would like to come into the office to be evaluated. She woke up this AM and pain is worse and has chills off and on. Denies fever and SOB. Able to eat and drink and she is voiding.Tested for COVID 05/04/2019 and was Negative. She does not feel pain is bad enough to warrant a ER visit and believes it is Diverticulitis.

## 2019-05-13 ENCOUNTER — Ambulatory Visit: Payer: Federal, State, Local not specified - PPO | Admitting: Family Medicine

## 2019-05-13 ENCOUNTER — Ambulatory Visit
Admission: RE | Admit: 2019-05-13 | Discharge: 2019-05-13 | Disposition: A | Discharge status: Home / Self Care | Payer: Federal, State, Local not specified - PPO | Source: Ambulatory Visit | Attending: Family Medicine | Admitting: Family Medicine

## 2019-05-13 ENCOUNTER — Encounter: Payer: Self-pay | Admitting: Family Medicine

## 2019-05-13 ENCOUNTER — Other Ambulatory Visit: Payer: Self-pay

## 2019-05-13 VITALS — BP 122/80 | HR 79 | Temp 97.8°F | Resp 16 | Ht 65.0 in | Wt 222.2 lb

## 2019-05-13 DIAGNOSIS — K921 Melena: Secondary | ICD-10-CM | POA: Diagnosis not present

## 2019-05-13 DIAGNOSIS — K5792 Diverticulitis of intestine, part unspecified, without perforation or abscess without bleeding: Secondary | ICD-10-CM

## 2019-05-13 DIAGNOSIS — R1030 Lower abdominal pain, unspecified: Secondary | ICD-10-CM | POA: Diagnosis not present

## 2019-05-13 DIAGNOSIS — R109 Unspecified abdominal pain: Secondary | ICD-10-CM | POA: Diagnosis not present

## 2019-05-13 MED ORDER — AMOXICILLIN-POT CLAVULANATE 875-125 MG PO TABS: 1.0000 | ORAL_TABLET | Freq: Two times a day (BID) | ORAL | 0 refills | Status: DC

## 2019-05-13 MED ORDER — IOPAMIDOL (ISOVUE-300) INJECTION 61%
100.0000 mL | Freq: Once | INTRAVENOUS | Status: AC | PRN
  Administered 2019-05-13: 100 mL via INTRAVENOUS

## 2019-05-13 NOTE — Progress Notes (Signed)
This visit occurred during the SARS-CoV-2 public health emergency.  Safety protocols were in place, including screening questions prior to the visit, additional usage of staff PPE, and extensive cleaning of exam room while observing appropriate contact time as indicated for disinfecting solutions.    Lindsay Hamilton , 1965/12/04, 54 y.o., female MRN: [id number] Patient Care Team    Relationship Specialty Notifications Start End  [name], DO PCP - General Family Medicine  04/15/19   Abbie Sons, MD Referring Physician Psychiatry  09/14/13   Garnet Sierras, DO Consulting Physician Allergy  03/17/19   Clarene Essex, MD Consulting Physician Gastroenterology  04/15/19   Vickey Huger, MD Consulting Physician Orthopedic Surgery  04/15/19   Donia Ast, [place]  Orthopedic Surgery  04/15/19   Roel Cluck, MD Referring Physician Ophthalmology  04/15/19   Lelon Perla, MD Consulting Physician Cardiology  04/15/19   Kathrynn Ducking, MD Consulting Physician Neurology  04/15/19   Arvella Nigh, MD Consulting Physician Obstetrics and Gynecology  04/15/19   Juluis Rainier  Optometry  04/15/19   Clabe Seal, DMD  Orthodontics  04/15/19     Chief Complaint  Patient presents with   Abdominal Pain    Pt continues to have abd pain and chills    Chills     Subjective: Pt presents for an OV with complaints of abdominal pain, bloody stool and chills of 3 weeks duration that has worsened over the last week.  Associated symptoms include bloody stools x1, mucousy stools, abdominal cramping, abdominal pain of both left and right lower quadrant.  Pain is worse in the left lower quadrant.  Patient reports no additional bloody stools except for once.  Her bowel movements otherwise have been normal without straining or diarrhea.  She continues to have chills.  She reports her appetite is good and she denies any nausea or vomiting.  She does not have a known history of  diverticulosis.  She has had a similar presentation last year this time.  She reports her pain has increased and it feels like "her insides are falling out. "   Depression screen [facility] 2/9 05/11/2019 04/15/2019  Decreased Interest 3 0  Down, Depressed, Hopeless 3 0  PHQ - 2 Score 6 0  Altered sleeping 0 -  Tired, decreased energy 3 -  Change in appetite 0 -  Feeling bad or failure about yourself  0 -  Trouble concentrating 0 -  Moving slowly or fidgety/restless 0 -  Suicidal thoughts 0 -  PHQ-9 Score 9 -  Difficult doing work/chores Not difficult at all -    Allergies  Allergen Reactions   Ambien [Zolpidem]    Social History   Social History Narrative   Marital status/children/pets: Married.  2 children.   Education/employment: High school graduate with some college.  Works as a Stage manager.   Safety:      -smoke alarm in the home:Yes     - wears seatbelt: Yes     - Feels safe in their relationships: Yes   Past Medical History:  Diagnosis Date   Alcohol abuse, in remission    Depression    Head injury    Headaches due to old head injury    Insomnia 09/14/2013   Migraine without aura, without mention of intractable migraine without mention of status migrainosus 09/14/2013   Obese    Pancreatitis    Pseudoseizures    Tubular adenoma of colon 2018  Past Surgical History:  Procedure Laterality Date   ABDOMINAL HYSTERECTOMY  2009   Partial   CARPAL TUNNEL RELEASE  1998   CESAREAN SECTION     x2-1992, 1997   CHOLECYSTECTOMY  2002   COLONOSCOPY W/ BIOPSIES  05/2016   tubular adenoma   GREAT TOE ARTHRODESIS, METATARSALPHALANGEAL JOINT Right 07/17/2018   KNEE ARTHROSCOPY     bilateral, 3 right and 3 left   SHOULDER ARTHROSCOPY Bilateral    Right 2012, left 2014   Family History  Problem Relation Age of Onset   Hypertension Mother    CAD Mother        Died of MI at age 67   Asthma Mother    Pulmonary embolism Mother         x2   Cancer - Prostate Father    Atrial fibrillation Father    Hyperlipidemia Father    Seizures Sister    Migraines Sister    Hypertension Brother    Hypertension Brother    Lung cancer Paternal Grandmother    Breast cancer Neg Hx    Allergic rhinitis Neg Hx    Eczema Neg Hx    Urticaria Neg Hx    Allergies as of 05/13/2019      Reactions   Ambien [zolpidem]       Medication List       Accurate as of May 13, 2019  2:19 PM. If you have any questions, ask your nurse or doctor.        albuterol 108 (90 Base) MCG/ACT inhaler Commonly known as: VENTOLIN HFA Inhale 1-2 puffs into the lungs every 6 (six) hours as needed for wheezing or shortness of breath (every 4-6 hours as needed for wheezing and coughing).   amoxicillin-clavulanate 875-125 MG tablet Commonly known as: AUGMENTIN Take 1 tablet by mouth 2 (two) times daily. Started by: Howard Pouch, DO   aspirin-acetaminophen-caffeine 250-250-65 MG tablet Commonly known as: EXCEDRIN MIGRAINE Take by mouth every 6 (six) hours as needed for headache.   bisacodyl 5 MG EC tablet Commonly known as: DULCOLAX Take 5 mg by mouth daily as needed for moderate constipation.   budesonide-formoterol 80-4.5 MCG/ACT inhaler Commonly known as: Symbicort Inhale 2 puffs into the lungs 2 (two) times daily.   buPROPion 150 MG 24 hr tablet Commonly known as: WELLBUTRIN XL Take 1 tablet (150 mg total) by mouth daily.   cetirizine 10 MG tablet Commonly known as: ZYRTEC Take 10 mg by mouth daily.   clonazePAM 0.5 MG tablet Commonly known as: KLONOPIN 1 tab QHS and 0.5 mg in morning PRN   COSAMIN VERDE FOR JOINT HEALTH PO Take 1 capsule by mouth daily.   escitalopram 20 MG tablet Commonly known as: LEXAPRO Take 1 tablet (20 mg total) by mouth at bedtime.   FIBER SELECT GUMMIES PO Take by mouth.   naproxen sodium 220 MG tablet Commonly known as: ALEVE Take 220 mg by mouth. 4 times daily for knee pain   NON  FORMULARY [employer]   PAPAYA ENZYME PO Take 1 capsule by mouth daily as needed (upset stomach).   polyethylene glycol 17 g packet Commonly known as: MIRALAX / GLYCOLAX Take 17 g by mouth daily.   Spacer/Aero-Holding [employer] Use with Symbicort PRN   VITAMIN D3 PO Take by mouth.   Zinc 50 MG Tabs Take by mouth.       All past medical history, surgical history, allergies, family history, immunizations andmedications were updated in the EMR today and  reviewed under the history and medication portions of their EMR.     ROS: Negative, with the exception of above mentioned in HPI   Objective:  BP 122/80 (BP Location: Left Arm, Patient Position: Sitting, Cuff Size: Normal)    Pulse 79    Temp 97.8 F (36.6 C) (Temporal)    Resp 16    Ht 5\' 5"  (1.651 m)    Wt 222 lb 4 oz (100.8 kg)    SpO2 97%    BMI 36.98 kg/m  Body mass index is 36.98 kg/m. Gen: Afebrile. No acute distress. Nontoxic in appearance, well developed, well nourished.  HENT: AT. [place]. Eyes:Pupils Equal Round Reactive to light, Extraocular movements intact,  Conjunctiva without redness, discharge or icterus. Abd: Soft. Pfannenstiel C-section scar present.  Flat. ND. BS present.  Tender to palpation left side of abdomen and right lower quadrant.  No masses palpated. No rebound.  Mild guarding.  Skin: No rashes, purpura or petechiae.  No abdominal skin changes. Neuro: Normal gait-mild splinting leaning forward in pain. PERLA. EOMi. Alert. Oriented x3  Psych: Normal affect, dress and demeanor. Normal speech. Normal thought content and judgment.  Assessment/Plan: Leilanni Liggins is a 54 y.o. female present for OV for  Lower abdominal pain/bloody stools Concern for diverticulitis or appendicitis given her presentation.  She seems to be rather uncomfortable especially when trying to lay flat and straightening her leg on the right. We will collect lab work today and start prophylactic treatment for presumed  diverticulitis with Augmentin twice daily.  CT ordered. - CT Abdomen Pelvis W Contrast; Future - Basic Metabolic Panel (BMET) - CBC w/Diff - Sedimentation rate Patient was encouraged to report to the emergency room immediately if pain, fever or blood per rectum worsens over the weekend prior to being able to obtain CT. -Encouraged her to start a clear liquid/soft diet for the next 48 hours once discomfort starts to ease may slowly advance diet as tolerated. -If CT without cause of her symptoms, would advise her to follow-up with her gastroenterologist for further eval.    Reviewed expectations re: course of current medical issues.  Discussed self-management of symptoms.  Outlined signs and symptoms indicating need for more acute intervention.  Patient verbalized understanding and all questions were answered.  Patient received an After-Visit Summary.    Orders Placed This Encounter  Procedures   CT Abdomen Pelvis W Contrast   Basic Metabolic Panel (BMET)   CBC w/Diff   Sedimentation rate   Meds ordered this encounter  Medications   amoxicillin-clavulanate (AUGMENTIN) 875-125 MG tablet    Sig: Take 1 tablet by mouth 2 (two) times daily.    Dispense:  28 tablet    Refill:  0     Note is dictated utilizing voice recognition software. Although note has been proof read prior to signing, occasional typographical errors still can be missed. If any questions arise, please do not hesitate to call for verification.   electronically signed by:  Howard Pouch, DO  [place]

## 2019-05-13 NOTE — Patient Instructions (Signed)
I am concerned for either diverticulitis or appendicitis. Danne Harbor is working on A CT and she will call you with appt .. if we are unable to get it schedule today and your symptoms worsen over the weekend>>> go to ED.  Clear diet until [ain and chills resolving and then advance slowly.       Diverticulitis  Diverticulitis is when small pockets in your large intestine (colon) get infected or swollen. This causes stomach pain and watery poop (diarrhea). These pouches are called diverticula. They form in people who have a condition called diverticulosis. Follow these instructions at home: Medicines  Take over-the-counter and prescription medicines only as told by your doctor. These include: ? Antibiotics. ? Pain medicines. ? Fiber pills. ? Probiotics. ? Stool softeners.  Do not drive or use heavy machinery while taking prescription pain medicine.  If you were prescribed an antibiotic, take it as told. Do not stop taking it even if you feel better. General instructions   Follow a diet as told by your doctor.  When you feel better, your doctor may tell you to change your diet. You may need to eat a lot of fiber. Fiber makes it easier to poop (have bowel movements). Healthy foods with fiber include: ? Berries. ? Beans. ? Lentils. ? Green vegetables.  Exercise 3 or more times a week. Aim for 30 minutes each time. Exercise enough to sweat and make your heart beat faster.  Keep all follow-up visits as told. This is important. You may need to have an exam of the large intestine. This is called a colonoscopy. Contact a doctor if:  Your pain does not get better.  You have a hard time eating or drinking.  You are not pooping like normal. Get help right away if:  Your pain gets worse.  Your problems do not get better.  Your problems get worse very fast.  You have a fever.  You throw up (vomit) more than one time.  You have poop that  is: ? Bloody. ? Black. ? Tarry. Summary  Diverticulitis is when small pockets in your large intestine (colon) get infected or swollen.  Take medicines only as told by your doctor.  Follow a diet as told by your doctor. This information is not intended to replace advice given to you by your health care provider. Make sure you discuss any questions you have with your health care provider. Document Revised: 04/03/2017 Document Reviewed: 05/08/2016 Elsevier Patient Education  [place].

## 2019-05-14 LAB — CBC WITH DIFFERENTIAL/PLATELET
Absolute Monocytes: 359 cells/uL (ref 200–950)
Basophils Absolute: 41 cells/uL (ref 0–200)
Basophils Relative: 0.6 %
Eosinophils Absolute: 152 cells/uL (ref 15–500)
Eosinophils Relative: 2.2 %
HCT: 37.5 % (ref 35.0–45.0)
Hemoglobin: 12 g/dL (ref 11.7–15.5)
Lymphs Abs: 3036 cells/uL (ref 850–3900)
MCH: 27.5 pg (ref 27.0–33.0)
MCHC: 32 g/dL (ref 32.0–36.0)
MCV: 85.8 fL (ref 80.0–100.0)
MPV: 10.3 fL (ref 7.5–12.5)
Monocytes Relative: 5.2 %
Neutro Abs: 3312 cells/uL (ref 1500–7800)
Neutrophils Relative %: 48 %
Platelets: 281 10*3/uL (ref 140–400)
RBC: 4.37 10*6/uL (ref 3.80–5.10)
RDW: 12.4 % (ref 11.0–15.0)
Total Lymphocyte: 44 %
WBC: 6.9 10*3/uL (ref 3.8–10.8)

## 2019-05-14 LAB — BASIC METABOLIC PANEL
BUN: 13 mg/dL (ref 7–25)
CO2: 29 mmol/L (ref 20–32)
Calcium: 9.4 mg/dL (ref 8.6–10.4)
Chloride: 103 mmol/L (ref 98–110)
Creat: 0.76 mg/dL (ref 0.50–1.05)
Glucose, Bld: 120 mg/dL — ABNORMAL HIGH (ref 65–99)
Potassium: 3.8 mmol/L (ref 3.5–5.3)
Sodium: 139 mmol/L (ref 135–146)

## 2019-05-14 LAB — SEDIMENTATION RATE: Sed Rate: 22 mm/h (ref 0–30)

## 2019-05-16 ENCOUNTER — Telehealth: Payer: Self-pay | Admitting: Family Medicine

## 2019-05-16 DIAGNOSIS — K625 Hemorrhage of anus and rectum: Secondary | ICD-10-CM

## 2019-05-16 DIAGNOSIS — R1030 Lower abdominal pain, unspecified: Secondary | ICD-10-CM

## 2019-05-16 NOTE — Telephone Encounter (Signed)
-----   Message from Ralph Dowdy, CMA sent at 05/16/2019 10:37 AM EST ----- Patient notified of lab results, expressed understanding.   Patient states she does not feel any better and would like referral to [place] with Dr Henrene Pastor.  Please advise.

## 2019-05-16 NOTE — Telephone Encounter (Signed)
Please make patient aware I have placed referral to gastroenterology as requested to Dr. Henrene Pastor.  They will call her from the office to schedule appointment once accepted.

## 2019-05-17 NOTE — Telephone Encounter (Signed)
I would encourage her to asked to be worked in to her gastroenterologist her on a cancellation list.

## 2019-05-17 NOTE — Telephone Encounter (Signed)
Pt was called and given information on VM  

## 2019-05-17 NOTE — Telephone Encounter (Signed)
Pt was called and [facility] has already called pt to set up appt but patient forgot she had a GI MD from before, [facility]. She has already called them and made appt for 05/26/2019. She said that is the soonest [facility] could see her and was wondering if we could call and get her in sooner. She does not need referral done to go.

## 2019-05-26 DIAGNOSIS — R1031 Right lower quadrant pain: Secondary | ICD-10-CM | POA: Diagnosis not present

## 2019-05-26 DIAGNOSIS — R140 Abdominal distension (gaseous): Secondary | ICD-10-CM | POA: Diagnosis not present

## 2019-06-07 DIAGNOSIS — Z9889 Other specified postprocedural states: Secondary | ICD-10-CM | POA: Diagnosis not present

## 2019-06-07 DIAGNOSIS — M25562 Pain in left knee: Secondary | ICD-10-CM | POA: Diagnosis not present

## 2019-06-07 DIAGNOSIS — M1712 Unilateral primary osteoarthritis, left knee: Secondary | ICD-10-CM | POA: Diagnosis not present

## 2019-06-15 DIAGNOSIS — Z1159 Encounter for screening for other viral diseases: Secondary | ICD-10-CM | POA: Diagnosis not present

## 2019-06-20 DIAGNOSIS — R1011 Right upper quadrant pain: Secondary | ICD-10-CM | POA: Diagnosis not present

## 2019-06-20 DIAGNOSIS — R1013 Epigastric pain: Secondary | ICD-10-CM | POA: Diagnosis not present

## 2019-06-20 DIAGNOSIS — K2930 Chronic superficial gastritis without bleeding: Secondary | ICD-10-CM | POA: Diagnosis not present

## 2019-06-21 ENCOUNTER — Encounter: Payer: Federal, State, Local not specified - PPO | Admitting: Family Medicine

## 2019-07-05 ENCOUNTER — Other Ambulatory Visit: Payer: Self-pay

## 2019-07-05 ENCOUNTER — Encounter: Payer: Self-pay | Admitting: Family Medicine

## 2019-07-05 ENCOUNTER — Ambulatory Visit (INDEPENDENT_AMBULATORY_CARE_PROVIDER_SITE_OTHER): Payer: Federal, State, Local not specified - PPO | Admitting: Family Medicine

## 2019-07-05 VITALS — BP 121/85 | HR 69 | Temp 97.3°F | Resp 17 | Ht 65.5 in | Wt 222.2 lb

## 2019-07-05 DIAGNOSIS — R7303 Prediabetes: Secondary | ICD-10-CM

## 2019-07-05 DIAGNOSIS — E669 Obesity, unspecified: Secondary | ICD-10-CM | POA: Diagnosis not present

## 2019-07-05 DIAGNOSIS — F418 Other specified anxiety disorders: Secondary | ICD-10-CM | POA: Diagnosis not present

## 2019-07-05 DIAGNOSIS — Z23 Encounter for immunization: Secondary | ICD-10-CM | POA: Diagnosis not present

## 2019-07-05 DIAGNOSIS — Z1231 Encounter for screening mammogram for malignant neoplasm of breast: Secondary | ICD-10-CM

## 2019-07-05 DIAGNOSIS — D126 Benign neoplasm of colon, unspecified: Secondary | ICD-10-CM

## 2019-07-05 DIAGNOSIS — G4700 Insomnia, unspecified: Secondary | ICD-10-CM

## 2019-07-05 DIAGNOSIS — Z0000 Encounter for general adult medical examination without abnormal findings: Secondary | ICD-10-CM | POA: Diagnosis not present

## 2019-07-05 LAB — POCT GLYCOSYLATED HEMOGLOBIN (HGB A1C)
HbA1c POC (<> result, manual entry): 5.8 % (ref 4.0–5.6)
HbA1c, POC (controlled diabetic range): 5.8 % (ref 0.0–7.0)
HbA1c, POC (prediabetic range): 5.8 % (ref 5.7–6.4)
Hemoglobin A1C: 5.8 % — AB (ref 4.0–5.6)

## 2019-07-05 MED ORDER — ESCITALOPRAM OXALATE 20 MG PO TABS: 20.0000 mg | ORAL_TABLET | Freq: Every day | ORAL | 1 refills | Status: DC

## 2019-07-05 NOTE — Patient Instructions (Addendum)
Klonopin dose before bed can be increased to 1.5-2 tabs before bed. You can continue the day time 1/2 tab as needed as well.  If you decide you want to cut back on anxiety med>>first start with discontinuing the day dose of klonopin, then if still ok cut Lexparo to 1/2 tab. Physical yearly and chronic condition follow ups every 6 months.   Health Maintenance, Female Adopting a healthy lifestyle and getting preventive care are important in promoting health and wellness. Ask your health care provider about:  The right schedule for you to have regular tests and exams.  Things you can do on your own to prevent diseases and keep yourself healthy. What should I know about diet, weight, and exercise? Eat a healthy diet   Eat a diet that includes plenty of vegetables, fruits, low-fat dairy products, and lean protein.  Do not eat a lot of foods that are high in solid fats, added sugars, or sodium. Maintain a healthy weight Body mass index (BMI) is used to identify weight problems. It estimates body fat based on height and weight. Your health care provider can help determine your BMI and help you achieve or maintain a healthy weight. Get regular exercise Get regular exercise. This is one of the most important things you can do for your health. Most adults should:  Exercise for at least 150 minutes each week. The exercise should increase your heart rate and make you sweat (moderate-intensity exercise).  Do strengthening exercises at least twice a week. This is in addition to the moderate-intensity exercise.  Spend less time sitting. Even light physical activity can be beneficial. Watch cholesterol and blood lipids Have your blood tested for lipids and cholesterol at 53 years of age, then have this test every 5 years. Have your cholesterol levels checked more often if:  Your lipid or cholesterol levels are high.  You are older than 53 years of age.  You are at high risk for heart  disease. What should I know about cancer screening? Depending on your health history and family history, you may need to have cancer screening at various ages. This may include screening for:  Breast cancer.  Cervical cancer.  Colorectal cancer.  Skin cancer.  Lung cancer. What should I know about heart disease, diabetes, and high blood pressure? Blood pressure and heart disease  High blood pressure causes heart disease and increases the risk of stroke. This is more likely to develop in people who have high blood pressure readings, are of African descent, or are overweight.  Have your blood pressure checked: ? Every 3-5 years if you are 75-68 years of age. ? Every year if you are 74 years old or older. Diabetes Have regular diabetes screenings. This checks your fasting blood sugar level. Have the screening done:  Once every three years after age 65 if you are at a normal weight and have a low risk for diabetes.  More often and at a younger age if you are overweight or have a high risk for diabetes. What should I know about preventing infection? Hepatitis B If you have a higher risk for hepatitis B, you should be screened for this virus. Talk with your health care provider to find out if you are at risk for hepatitis B infection. Hepatitis C Testing is recommended for:  Everyone born from [birth year] through 1964.  Anyone with known risk factors for hepatitis C. Sexually transmitted infections (STIs)  Get screened for STIs, including gonorrhea and chlamydia, if: ?  You are sexually active and are younger than 53 years of age. ? You are older than 53 years of age and your health care provider tells you that you are at risk for this type of infection. ? Your sexual activity has changed since you were last screened, and you are at increased risk for chlamydia or gonorrhea. Ask your health care provider if you are at risk.  Ask your health care provider about whether you are at high  risk for HIV. Your health care provider may recommend a prescription medicine to help prevent HIV infection. If you choose to take medicine to prevent HIV, you should first get tested for HIV. You should then be tested every 3 months for as long as you are taking the medicine. Pregnancy  If you are about to stop having your period (premenopausal) and you may become pregnant, seek counseling before you get pregnant.  Take 400 to 800 micrograms (mcg) of folic acid every day if you become pregnant.  Ask for birth control (contraception) if you want to prevent pregnancy. Osteoporosis and menopause Osteoporosis is a disease in which the bones lose minerals and strength with aging. This can result in bone fractures. If you are 41 years old or older, or if you are at risk for osteoporosis and fractures, ask your health care provider if you should:  Be screened for bone loss.  Take a calcium or vitamin D supplement to lower your risk of fractures.  Be given hormone replacement therapy (HRT) to treat symptoms of menopause. Follow these instructions at home: Lifestyle  Do not use any products that contain nicotine or tobacco, such as cigarettes, e-cigarettes, and chewing tobacco. If you need help quitting, ask your health care provider.  Do not use street drugs.  Do not share needles.  Ask your health care provider for help if you need support or information about quitting drugs. Alcohol use  Do not drink alcohol if: ? Your health care provider tells you not to drink. ? You are pregnant, may be pregnant, or are planning to become pregnant.  If you drink alcohol: ? Limit how much you use to 0-1 drink a day. ? Limit intake if you are breastfeeding.  Be aware of how much alcohol is in your drink. In the [place] one drink equals one 12 oz bottle of beer (355 mL), one 5 oz glass of wine (148 mL), or one 1 oz glass of hard liquor (44 mL). General instructions  Schedule regular health, dental,  and eye exams.  Stay current with your vaccines.  Tell your health care provider if: ? You often feel depressed. ? You have ever been abused or do not feel safe at home. Summary  Adopting a healthy lifestyle and getting preventive care are important in promoting health and wellness.  Follow your health care provider's instructions about healthy diet, exercising, and getting tested or screened for diseases.  Follow your health care provider's instructions on monitoring your cholesterol and blood pressure. This information is not intended to replace advice given to you by your health care provider. Make sure you discuss any questions you have with your health care provider. Document Revised: 04/14/2018 Document Reviewed: 04/14/2018 Elsevier Patient Education  2020 [employer].

## 2019-07-05 NOTE — Progress Notes (Signed)
This visit occurred during the SARS-CoV-2 public health emergency.  Safety protocols were in place, including screening questions prior to the visit, additional usage of staff PPE, and extensive cleaning of exam room while observing appropriate contact time as indicated for disinfecting solutions.    Patient ID: Lindsay Hamilton, female  DOB: 1965/09/11, 54 y.o.   MRN: [id number] Patient Care Team    Relationship Specialty Notifications Start End  [name], DO PCP - General Family Medicine  04/15/19   Abbie Sons, MD Referring Physician Psychiatry  09/14/13   Garnet Sierras, DO Consulting Physician Allergy  03/17/19   Clarene Essex, MD Consulting Physician Gastroenterology  04/15/19   Vickey Huger, MD Consulting Physician Orthopedic Surgery  04/15/19   Donia Ast, [place]  Orthopedic Surgery  04/15/19   Roel Cluck, MD Referring Physician Ophthalmology  04/15/19   Lelon Perla, MD Consulting Physician Cardiology  04/15/19   Kathrynn Ducking, MD Consulting Physician Neurology  04/15/19   Arvella Nigh, MD Consulting Physician Obstetrics and Gynecology  04/15/19   Juluis Rainier  Optometry  04/15/19   Clabe Seal, DMD  Orthodontics  04/15/19     Chief Complaint  Patient presents with  . Annual Exam    Pap smear 2016, mammogram 04/2019    Subjective:  Lindsay Hamilton is a 54 y.o.  Female  present for CPE. All past medical history, surgical history, allergies, family history, immunizations, medications and social history were updated in the electronic medical record today. All recent labs, ED visits and hospitalizations within the last year were reviewed.  Insomnia, unspecified type/Depression with anxiety Patient reports she has noticed some increase in her sleep disturbance.  She has not been able to fall asleep using the Klonopin 0.5 mg nightly.  When last seen this was effective for her.  She is having some mild increase in her stress.  She  reports compliance with Wellbutrin 150 mg daily and Lexapro 20 mg daily. Prior note: Patient reports overall she is doing well since the changes in medication.  She is sleeping well with using the Klonopin 0.5 mg nightly.  She has not used the [place] and feels she is getting good sleep with the Klonopin.  She does endorse having some increase in her emotional state, feeling more tearful at times since stopping the Lunesta and the Xanax.  She has been taking the Xanax twice daily.  However she has only been taking the Klonopin at night.  It was written to allow her to have 1 Klonopin before bed and a half a tab during the day.  She states she has not been taking the Klonopin during the day.   Health maintenance:  Colonoscopy: completed 2018, by Dr. Watt Climes, follow up 5 years. Mammogram: completed: 04/04/2019 [facility] [place] Cervical cancer screening: Hysterectomy Immunizations: tdap UTD 2014, Influenza UTD 2020 (encouraged yearly), Shingrix No. 1 provided today-nurse visit 2-6 months for completion we Shingrix No. 2.  Covid vaccines have been completed greater than 2 weeks ago. Infectious disease screening: HIV completed DEXA: Routine screening Assistive device: None Oxygen use: None Patient has a Dental home. Hospitalizations/ED visits: Reviewed   Depression screen [facility] 2/9 05/11/2019 04/15/2019  Decreased Interest 3 0  Down, Depressed, Hopeless 3 0  PHQ - 2 Score 6 0  Altered sleeping 0 -  Tired, decreased energy 3 -  Change in appetite 0 -  Feeling bad or failure about yourself  0 -  Trouble concentrating 0 -  Moving slowly or fidgety/restless 0 -  Suicidal thoughts 0 -  PHQ-9 Score 9 -  Difficult doing work/chores Not difficult at all -   GAD 7 : Generalized Anxiety Score 05/11/2019  Nervous, Anxious, on Edge 3  Control/stop worrying 0  Worry too much - different things 0  Trouble relaxing 3  Restless 0  Easily annoyed or irritable 3  Afraid - awful might happen 0  Total GAD 7 Score 9    Anxiety Difficulty Not difficult at all    Immunization History  Administered Date(s) Administered  . Influenza-Unspecified 02/16/2019  . PFIZER SARS-COV-2 Vaccination 05/17/2019, 06/07/2019  . Tdap 04/08/2013  . Zoster Recombinat (Shingrix) 07/05/2019   Past Medical History:  Diagnosis Date  . Alcohol abuse, in remission   . Depression   . Head injury   . Headaches due to old head injury   . Insomnia 09/14/2013  . Migraine without aura, without mention of intractable migraine without mention of status migrainosus 09/14/2013  . Obese   . Pancreatitis   . Pseudoseizures   . Tubular adenoma of colon 2018   Allergies  Allergen Reactions  . Ambien [Zolpidem]    Past Surgical History:  Procedure Laterality Date  . ABDOMINAL HYSTERECTOMY  2009   Partial  . CARPAL TUNNEL RELEASE  1998  . CESAREAN SECTION     x2-1992, 1997  . CHOLECYSTECTOMY  2002  . COLONOSCOPY W/ BIOPSIES  05/2016   tubular adenoma  . GREAT TOE ARTHRODESIS, METATARSALPHALANGEAL JOINT Right 07/17/2018  . KNEE ARTHROSCOPY     bilateral, 3 right and 3 left  . SHOULDER ARTHROSCOPY Bilateral    Right 2012, left 2014   Family History  Problem Relation Age of Onset  . Hypertension Mother   . CAD Mother        Died of MI at age 55  . Asthma Mother   . Pulmonary embolism Mother        x2  . Cancer - Prostate Father   . Atrial fibrillation Father   . Hyperlipidemia Father   . Seizures Sister   . Migraines Sister   . Hypertension Brother   . Hypertension Brother   . Lung cancer Paternal Grandmother   . Breast cancer Neg Hx   . Allergic rhinitis Neg Hx   . Eczema Neg Hx   . Urticaria Neg Hx    Social History   Social History Narrative   Marital status/children/pets: Married.  2 children.   Education/employment: High school graduate with some college.  Works as a Stage manager.   Safety:      -smoke alarm in the home:Yes     - wears seatbelt: Yes     - Feels safe in their  relationships: Yes    Allergies as of 07/05/2019      Reactions   Ambien [zolpidem]       Medication List       Accurate as of July 05, 2019 11:59 PM. If you have any questions, ask your nurse or doctor.        STOP taking these medications   amoxicillin-clavulanate 875-125 MG tablet Commonly known as: AUGMENTIN Stopped by: Howard Pouch, DO   aspirin-acetaminophen-caffeine 250-250-65 MG tablet Commonly known as: EXCEDRIN MIGRAINE Stopped by: Howard Pouch, DO   cetirizine 10 MG tablet Commonly known as: ZYRTEC Stopped by: Howard Pouch, DO   PAPAYA ENZYME PO Stopped by: Howard Pouch, DO   polyethylene glycol 17 g packet Commonly known as: MIRALAX /  GLYCOLAX Stopped by: Howard Pouch, DO   Zinc 50 MG Tabs Stopped by: Howard Pouch, DO     TAKE these medications   albuterol 108 (90 Base) MCG/ACT inhaler Commonly known as: VENTOLIN HFA Inhale 1-2 puffs into the lungs every 6 (six) hours as needed for wheezing or shortness of breath (every 4-6 hours as needed for wheezing and coughing).   bisacodyl 5 MG EC tablet Commonly known as: DULCOLAX Take 5 mg by mouth daily as needed for moderate constipation.   budesonide-formoterol 80-4.5 MCG/ACT inhaler Commonly known as: Symbicort Inhale 2 puffs into the lungs 2 (two) times daily.   buPROPion 150 MG 24 hr tablet Commonly known as: WELLBUTRIN XL Take 1 tablet (150 mg total) by mouth daily.   clonazePAM 0.5 MG tablet Commonly known as: KLONOPIN 1/2 tab during the day and 1-2 tabs QHS and What changed: additional instructions Changed by: Howard Pouch, DO   [place] PO Take 1 capsule by mouth daily.   escitalopram 20 MG tablet Commonly known as: LEXAPRO Take 1 tablet (20 mg total) by mouth at bedtime.   FIBER SELECT GUMMIES PO Take by mouth.   hyoscyamine 0.375 MG 12 hr tablet Commonly known as: LEVBID Take 0.375 mg by mouth 2 (two) times daily.   naproxen sodium 220 MG tablet Commonly  known as: ALEVE Take 220 mg by mouth. 4 times daily for knee pain   NON FORMULARY [employer]   Spacer/Aero-Holding [employer] Use with Symbicort PRN   VITAMIN D3 PO Take by mouth.       All past medical history, surgical history, allergies, family history, immunizations andmedications were updated in the EMR today and reviewed under the history and medication portions of their EMR.      ROS: 14 pt review of systems performed and negative (unless mentioned in an HPI)  Objective: BP 121/85 (BP Location: Right Arm, Patient Position: Sitting, Cuff Size: Normal)   Pulse 69   Temp (!) 97.3 F (36.3 C) (Temporal)   Resp 17   Ht 5' 5.5" (1.664 m)   Wt 222 lb 4 oz (100.8 kg)   SpO2 98%   BMI 36.42 kg/m  Gen: Afebrile. No acute distress. Nontoxic in appearance, well-developed, well-nourished, very pleasant Caucasian obese female. HENT: AT. [place]. Bilateral TM visualized and normal in appearance, normal external auditory canal. MMM, no oral lesions, adequate dentition. Bilateral nares within normal limits. Throat without erythema, ulcerations or exudates.  No cough on exam, no hoarseness on exam. Eyes:Pupils Equal Round Reactive to light, Extraocular movements intact,  Conjunctiva without redness, discharge or icterus. Neck/lymp/endocrine: Supple, no lymphadenopathy, no thyromegaly CV: RRR no murmur, no edema, +2/4 P posterior tibialis pulses.   Chest: CTAB, no wheeze, rhonchi or crackles.  Normal respiratory effort.  Good air movement. Abd: Soft.  Obese. ND.  Mild epigastric tenderness remains.  BS present.  No masses palpated. No hepatosplenomegaly. No rebound tenderness or guarding. Skin: No rashes, purpura or petechiae. Warm and well-perfused. Skin intact. Neuro/Msk:  Normal gait. PERLA. EOMi. Alert. Oriented x3.  Cranial nerves II through XII intact. Muscle strength 5/5 upper/lower extremity. DTRs equal bilaterally. Psych: Normal affect, dress and demeanor. Normal speech. Normal  thought content and judgment.   Assessment/plan: Lindsay Hamilton is a 54 y.o. female present for CPE Insomnia, unspecified type/Depression with anxiety Depression and anxiety is stable, sleep disorder is worsening. Continue Wellbutrin 150 mg daily.  Could consider increase if depression anxiety worsens. Continue Lexapro 20 mg daily.  Continue Klonopin with mild increase in night dose 0.5-1 mg nightly and use half a tab 12 hours later if needed only.  Discontinued Lunesta and Xanax -04/15/2019. [place] controlled substance database reviewed 07/07/19 We will have patient sign controlled substance contract on next visit once dose of Klonopin is stable and visit is in person.   Patient counseled on addictive properties of benzodiazepines. Follow-up 5-6 months, sooner if needed Prediabetes/obesity A1c 6.2>>5.8 today.  Congratulated her.  Great job. Continue dietary modifications and exercise. - POCT glycosylated hemoglobin (Hb A1C) Need for shingles vaccine Shingrix No. 2 in 2-6 months.  Can be either by nurse visit or if her routine chronic follow-up occurs before 6 months can provide to her at that visit. - Varicella-zoster vaccine IM Encounter for screening mammogram for malignant neoplasm of breast - MM 3D SCREEN BREAST BILATERAL; Future Encounter for preventive health examination Patient was encouraged to exercise greater than 150 minutes a week. Patient was encouraged to choose a diet filled with fresh fruits and vegetables, and lean meats. AVS provided to patient today for education/recommendation on gender specific health and safety maintenance. Colonoscopy: Due 2023 5-year follow-up Mammogram: Completed for 2020, ordered 03/2020 mammogram follow-up. Cervical cancer screening: Not indicated-hysterectomy Immunizations: All immunizations up-to-date.  Shingrix No. 1 provided today Infectious disease screening:completed DEXA: Routine screening  Return in about 5 months  (around 12/05/2019) for cmc and shingrix #2.  Orders Placed This Encounter  Procedures  . MM 3D SCREEN BREAST BILATERAL  . Varicella-zoster vaccine IM  . POCT glycosylated hemoglobin (Hb A1C)    Orders Placed This Encounter  Procedures  . MM 3D SCREEN BREAST BILATERAL  . Varicella-zoster vaccine IM  . POCT glycosylated hemoglobin (Hb A1C)   Meds ordered this encounter  Medications  . escitalopram (LEXAPRO) 20 MG tablet    Sig: Take 1 tablet (20 mg total) by mouth at bedtime.    Dispense:  90 tablet    Refill:  1    Please DC other scripts for this med- provider change. Thanks !!  . clonazePAM (KLONOPIN) 0.5 MG tablet    Sig: 1/2 tab during the day and 1-2 tabs QHS and    Dispense:  75 tablet    Refill:  5    DC prior scripts- dose change. Thanks.  Marland Kitchen buPROPion (WELLBUTRIN XL) 150 MG 24 hr tablet    Sig: Take 1 tablet (150 mg total) by mouth daily.    Dispense:  90 tablet    Refill:  1   Referral Orders  No referral(s) requested today     Electronically signed by: Howard Pouch, [place]

## 2019-07-06 MED ORDER — CLONAZEPAM 0.5 MG PO TABS: ORAL_TABLET | ORAL | 5 refills | Status: DC

## 2019-07-07 ENCOUNTER — Encounter: Payer: Self-pay | Admitting: Family Medicine

## 2019-07-07 MED ORDER — BUPROPION HCL ER (XL) 150 MG PO TB24: 150.0000 mg | ORAL_TABLET | Freq: Every day | ORAL | 1 refills | Status: DC

## 2019-07-14 ENCOUNTER — Telehealth: Payer: Self-pay

## 2019-07-14 NOTE — Telephone Encounter (Signed)
Patient scheduled for doxy visit 07/15/19.

## 2019-07-14 NOTE — Telephone Encounter (Signed)
Patient is coughing so much at work today that her co-workers are concerned. She has been coughing since October but it is so bad today she is unable to talk without "barking". No appointments available today, please advise.

## 2019-07-15 ENCOUNTER — Ambulatory Visit (HOSPITAL_BASED_OUTPATIENT_CLINIC_OR_DEPARTMENT_OTHER)
Admission: RE | Admit: 2019-07-15 | Discharge: 2019-07-15 | Disposition: A | Discharge status: Home / Self Care | Payer: Federal, State, Local not specified - PPO | Source: Ambulatory Visit | Attending: Family Medicine | Admitting: Family Medicine

## 2019-07-15 ENCOUNTER — Encounter: Payer: Self-pay | Admitting: Family Medicine

## 2019-07-15 ENCOUNTER — Ambulatory Visit (INDEPENDENT_AMBULATORY_CARE_PROVIDER_SITE_OTHER): Payer: Federal, State, Local not specified - PPO | Admitting: Family Medicine

## 2019-07-15 ENCOUNTER — Other Ambulatory Visit: Payer: Self-pay

## 2019-07-15 VITALS — Temp 98.2°F | Ht 65.5 in | Wt 222.0 lb

## 2019-07-15 DIAGNOSIS — J4531 Mild persistent asthma with (acute) exacerbation: Secondary | ICD-10-CM

## 2019-07-15 DIAGNOSIS — R05 Cough: Secondary | ICD-10-CM | POA: Diagnosis not present

## 2019-07-15 MED ORDER — HYDROCODONE-HOMATROPINE 5-1.5 MG/5ML PO SYRP: 5.0000 mL | ORAL_SOLUTION | Freq: Three times a day (TID) | ORAL | 0 refills | Status: DC | PRN

## 2019-07-15 MED ORDER — PREDNISONE 20 MG PO TABS: ORAL_TABLET | ORAL | 0 refills | Status: DC

## 2019-07-15 MED ORDER — DOXYCYCLINE HYCLATE 100 MG PO TABS: 100.0000 mg | ORAL_TABLET | Freq: Two times a day (BID) | ORAL | 0 refills | Status: DC

## 2019-07-15 NOTE — Patient Instructions (Signed)

## 2019-07-15 NOTE — Progress Notes (Signed)
VIRTUAL VISIT VIA VIDEO  I connected with Lindsay Hamilton on 07/15/19 at 11:00 AM EST by elemedicine application and verified that I am speaking with the correct person using two identifiers. Location patient: Home Location provider: [facility], Office Persons participating in the virtual visit: Patient, Dr. Raoul Pitch and R.Baker, LPN  I discussed the limitations of evaluation and management by telemedicine and the availability of in person appointments. The patient expressed understanding and agreed to proceed.   SUBJECTIVE Chief Complaint  Patient presents with  . Cough    cough since October - see Dr [name] with Allergy - using Symbicort. Cough worsened about 1 week ago. Sore throat. Denies SOB/CP. Cough is very deep raspy. Little production but mostly dry.     HPI: Lindsay Hamilton is a  54 y.o. female present for worsening dry cough that started 1 week ago.  She has history of reactive airway disease and has been seen by asthma and allergy.  He currently is compliant with her Symbicort 2 puffs twice daily and an antihistamine.  She reports over the last week she has needed to use her albuterol inhaler at least once daily.  She states it does help when she uses the albuterol.  She has not been exposed to any known sick contacts or Covid viral illness.  She has had both of her Covid vaccines greater than 2 weeks ago.  She has tried Delsym over-the-counter which has not been helpful.  She denies any fever, chills, nausea, vomit or shortness of breath.  She reports her cough is extremely dry. ROS: See pertinent positives and negatives per HPI.  Patient Active Problem List   Diagnosis Date Noted  . Encounter for preventive health examination 07/05/2019  . Obesity (BMI 30-39.9) 07/05/2019  . Abdominal cramping 05/11/2019  . Reactive airway disease 04/21/2019  . Prediabetes 04/15/2019  . Depression with anxiety 04/15/2019  . Coughing 03/17/2019  . Pruritus 03/17/2019  .  Chronic rhinitis 03/17/2019  . Tubular adenoma of colon 2018  . Murmur 11/01/2014  . Migraine without aura 09/14/2013  . Insomnia 09/14/2013    Social History   Tobacco Use  . Smoking status: Former Smoker    Quit date: 1997    Years since quitting: 24.2  . Smokeless tobacco: Never Used  Substance Use Topics  . Alcohol use: Yes    Comment: social    Current Outpatient Medications:  .  albuterol (VENTOLIN HFA) 108 (90 Base) MCG/ACT inhaler, Inhale 1-2 puffs into the lungs every 6 (six) hours as needed for wheezing or shortness of breath (every 4-6 hours as needed for wheezing and coughing)., Disp: 18 g, Rfl: 2 .  bisacodyl (DULCOLAX) 5 MG EC tablet, Take 5 mg by mouth daily as needed for moderate constipation., Disp: , Rfl:  .  budesonide-formoterol (SYMBICORT) 80-4.5 MCG/ACT inhaler, Inhale 2 puffs into the lungs 2 (two) times daily., Disp: 1 Inhaler, Rfl: 3 .  buPROPion (WELLBUTRIN XL) 150 MG 24 hr tablet, Take 1 tablet (150 mg total) by mouth daily., Disp: 90 tablet, Rfl: 1 .  Cholecalciferol (VITAMIN D3 PO), Take by mouth., Disp: , Rfl:  .  clonazePAM (KLONOPIN) 0.5 MG tablet, 1/2 tab during the day and 1-2 tabs QHS and, Disp: 75 tablet, Rfl: 5 .  escitalopram (LEXAPRO) 20 MG tablet, Take 1 tablet (20 mg total) by mouth at bedtime., Disp: 90 tablet, Rfl: 1 .  FIBER SELECT GUMMIES PO, Take by mouth., Disp: , Rfl:  .  hyoscyamine (  LEVBID) 0.375 MG 12 hr tablet, Take 0.375 mg by mouth 2 (two) times daily., Disp: , Rfl:  .  Misc Natural Products (Wal[place] PO), Take 1 capsule by mouth daily., Disp: , Rfl:  .  naproxen sodium (ALEVE) 220 MG tablet, Take 220 mg by mouth. 4 times daily for knee pain, Disp: , Rfl:  .  NON FORMULARY, Fitteam, Disp: , Rfl:  .  Spacer/Aero-Holding Chambers DEVI, Use with Symbicort PRN, Disp: 1 Device, Rfl: 0 .  doxycycline (VIBRA-TABS) 100 MG tablet, Take 1 tablet (100 mg total) by mouth 2 (two) times daily., Disp: 20 tablet, Rfl: 0 .   HYDROcodone-homatropine (HYCODAN) 5-1.5 MG/5ML syrup, Take 5 mLs by mouth every 8 (eight) hours as needed for cough., Disp: 120 mL, Rfl: 0 .  predniSONE (DELTASONE) 20 MG tablet, 60 mg x3d, 40 mg x3d, 20 mg x2d, 10 mg x2d, Disp: 18 tablet, Rfl: 0  Allergies  Allergen Reactions  . Ambien [Zolpidem]     OBJECTIVE: Temp 98.2 F (36.8 C) (Temporal)   Ht 5' 5.5" (1.664 m)   Wt 222 lb (100.7 kg)   BMI 36.38 kg/m  Gen: No acute distress. Nontoxic in appearance.  Deep bronchial cough present on exam HENT: AT. [place].  MMM.  Eyes:Pupils Equal Round Reactive to light, Extraocular movements intact,  Conjunctiva without redness, discharge or icterus. Chest: Cough present, shortness of breath not present. Neuro:Alert. Oriented x3  Psych: Normal affect and demeanor. Normal speech. Normal thought content and judgment.  ASSESSMENT AND PLAN: Shauntay Adi is a 54 y.o. female present for  Mild persistent asthmatic bronchitis with acute exacerbation Rest, hydrate.  Start/continue: +/- flonase, mucinex (DM if cough), daily OTC antihistamine Continue Symbicort Continue albuterol as needed Prescribed prednisone taper and doxycycline. Hycodan cough syrup to be used sparingly for cough. Chest x-ray ordered for her to have completed over the weekend to rule out pneumonia.  Patient will be called on Monday with results and follow-up will be discussed depending upon those results. If cough present it can last up to 6-8 weeks.  F/U 2 weeks of not improved.    Howard Pouch, DO 07/15/2019    Orders Placed This Encounter  Procedures  . DG Chest 2 View   Meds ordered this encounter  Medications  . predniSONE (DELTASONE) 20 MG tablet    Sig: 60 mg x3d, 40 mg x3d, 20 mg x2d, 10 mg x2d    Dispense:  18 tablet    Refill:  0  . doxycycline (VIBRA-TABS) 100 MG tablet    Sig: Take 1 tablet (100 mg total) by mouth 2 (two) times daily.    Dispense:  20 tablet    Refill:  0  . HYDROcodone-homatropine  (HYCODAN) 5-1.5 MG/5ML syrup    Sig: Take 5 mLs by mouth every 8 (eight) hours as needed for cough.    Dispense:  120 mL    Refill:  0   Referral Orders  No referral(s) requested today

## 2019-07-19 ENCOUNTER — Ambulatory Visit: Payer: Federal, State, Local not specified - PPO

## 2019-07-21 ENCOUNTER — Ambulatory Visit: Payer: Federal, State, Local not specified - PPO | Admitting: Allergy

## 2019-07-21 NOTE — Progress Notes (Deleted)
Follow Up Note  RE: Lindsay Hamilton MRN: [id number] DOB: 06/29/1965 Date of Office Visit: 07/21/2019  Referring provider: [name], DO Primary care provider: [name], DO  Chief Complaint: No chief complaint on file.  History of Present Illness: I had the pleasure of seeing Lindsay Hamilton for a follow up visit at the [facility] on 07/21/2019. She is a 54 y.o. female, who is being followed for asthma, rhinitis, pruritus. Her previous allergy office visit was on 04/21/2019 with Dr. [name]. Today is a regular follow up visit.  Reactive airway disease Past history - Coughing, wheezing, SOB for the past 3 yeas in January and October for a few months at a time. No specific triggers noted but exertion does make it worse. 2 courses of oral prednisone this year with some benefit. Lindsay Hamilton CXR in October 2020. Denies heartburn/reflux/PND. 2020 spirometry showed mild restriction with no improvement in FEV1 post bronchodilator treatment.  Interim history - doing much better with Symbicort. Albuterol does alleviate coughing fits.   Daily controller medication(s):continue Symbicort 80 2 puffs twice a day with spacer and rinse mouth afterwards.  Prior to physical activity:May use albuterol rescue inhaler 2 puffs 5 to 15 minutes prior to strenuous physical activities.  Rescue medications:May use albuterol rescue inhaler 2 puffs or nebulizer every 4 to 6 hours as needed for shortness of breath, chest tightness, coughing, and wheezing. Monitor frequency of use.   Chronic rhinitis Past history - Rhino conjunctivitis symptoms in the winter, spring and fall for the past 3 years. She had skin testing over 10 years which was positive to grass, mold, trees, cats and dogs per patient report. 2020 skin testing and immunocap was negative to environmental allergies. Interim history - asymptomatic.   May try to stop zyrtec and monitor symptoms.   Pruritus Past history - Whole  body pruritus for the past few months. Denies changes in diet, medications or personal care products.  Interim history - Much better with proper skin care and not sure if zyrtec helping or not.   Continue proper skin care.  Stop zyrtec and see if pruritus returns.   Return in about 3 months (around 07/20/2019).  Assessment and Plan: Lindsay Hamilton is a 54 y.o. female with: No problem-specific Assessment & Plan notes found for this encounter.  No follow-ups on file.  No orders of the defined types were placed in this encounter.  Lab Orders  No laboratory test(s) ordered today    Diagnostics: Spirometry:  Tracings reviewed. Her effort: {Blank single:19197::"Good reproducible efforts.","It was hard to get consistent efforts and there is a question as to whether this reflects a maximal maneuver.","Poor effort, data can not be interpreted."} FVC: ***L FEV1: ***L, ***% predicted FEV1/FVC ratio: ***% Interpretation: {Blank single:19197::"Spirometry consistent with mild obstructive disease","Spirometry consistent with moderate obstructive disease","Spirometry consistent with severe obstructive disease","Spirometry consistent with possible restrictive disease","Spirometry consistent with mixed obstructive and restrictive disease","Spirometry uninterpretable due to technique","Spirometry consistent with normal pattern","No overt abnormalities noted given today's efforts"}.  Please see scanned spirometry results for details.  Skin Testing: {Blank single:19197::"Select foods","Environmental allergy panel","Environmental allergy panel and select foods","Food allergy panel","None","Deferred due to recent antihistamines use"}. Positive test to: ***. Negative test to: ***.  Results discussed with patient/family.   Medication List:  Current Outpatient Medications  Medication Sig Dispense Refill  . albuterol (VENTOLIN HFA) 108 (90 Base) MCG/ACT inhaler Inhale 1-2 puffs into the lungs every 6 (six) hours  as needed for wheezing or shortness of  breath (every 4-6 hours as needed for wheezing and coughing). 18 g 2  . bisacodyl (DULCOLAX) 5 MG EC tablet Take 5 mg by mouth daily as needed for moderate constipation.    . budesonide-formoterol (SYMBICORT) 80-4.5 MCG/ACT inhaler Inhale 2 puffs into the lungs 2 (two) times daily. 1 Inhaler 3  . buPROPion (WELLBUTRIN XL) 150 MG 24 hr tablet Take 1 tablet (150 mg total) by mouth daily. 90 tablet 1  . Cholecalciferol (VITAMIN D3 PO) Take by mouth.    . clonazePAM (KLONOPIN) 0.5 MG tablet 1/2 tab during the day and 1-2 tabs QHS and 75 tablet 5  . doxycycline (VIBRA-TABS) 100 MG tablet Take 1 tablet (100 mg total) by mouth 2 (two) times daily. 20 tablet 0  . escitalopram (LEXAPRO) 20 MG tablet Take 1 tablet (20 mg total) by mouth at bedtime. 90 tablet 1  . FIBER SELECT GUMMIES PO Take by mouth.    Marland Kitchen HYDROcodone-homatropine (HYCODAN) 5-1.5 MG/5ML syrup Take 5 mLs by mouth every 8 (eight) hours as needed for cough. 120 mL 0  . hyoscyamine (LEVBID) 0.375 MG 12 hr tablet Take 0.375 mg by mouth 2 (two) times daily.    . Misc Natural Products (COSAMIN VERDE FOR JOINT HEALTH PO) Take 1 capsule by mouth daily.    . naproxen sodium (ALEVE) 220 MG tablet Take 220 mg by mouth. 4 times daily for knee pain    . NON FORMULARY Fitteam    . predniSONE (DELTASONE) 20 MG tablet 60 mg x3d, 40 mg x3d, 20 mg x2d, 10 mg x2d 18 tablet 0  . Spacer/Aero-Holding Dorise Bullion Use with Symbicort PRN 1 Device 0   No current facility-administered medications for this visit.   Allergies: Allergies  Allergen Reactions  . Ambien [Zolpidem]    I reviewed her past medical history, social history, family history, and environmental history and no significant changes have been reported from her previous visit.  Review of Systems  Constitutional: Negative for appetite change, chills, fever and unexpected weight change.  HENT: Negative for congestion and rhinorrhea.   Eyes: Negative for  itching.  Respiratory: Positive for cough. Negative for chest tightness, shortness of breath and wheezing.   Cardiovascular: Negative for chest pain.  Gastrointestinal: Negative for abdominal pain.  Genitourinary: Negative for difficulty urinating.  Skin: Negative for rash.  Allergic/Immunologic: Negative for environmental allergies and food allergies.  Neurological: Positive for headaches.   Objective: There were no vitals taken for this visit. There is no height or weight on file to calculate BMI. Physical Exam  Constitutional: She is oriented to person, place, and time. She appears well-developed and well-nourished.  HENT:  Head: Normocephalic and atraumatic.  Right Ear: External ear normal.  Left Ear: External ear normal.  Nose: Nose normal.  Mouth/Throat: Oropharynx is clear and moist.  Eyes: Conjunctivae and EOM are normal.  Cardiovascular: Normal rate, regular rhythm and normal heart sounds. Exam reveals no gallop and no friction rub.  No murmur heard. Pulmonary/Chest: Effort normal and breath sounds normal. She has no wheezes. She has no rales.  Abdominal: Soft.  Musculoskeletal:     Cervical back: Neck supple.  Neurological: She is alert and oriented to person, place, and time.  Skin: Skin is warm. No rash noted.  Psychiatric: She has a normal mood and affect. Her behavior is normal.  Nursing note and vitals reviewed.  Previous notes and tests were reviewed. The plan was reviewed with the patient/family, and all questions/concerned were addressed.  It was  my pleasure to see Zareah today and participate in her care. Please feel free to contact me with any questions or concerns.  Sincerely,  Rexene Alberts, DO Allergy & Immunology  [facility] office: [phone] [facility] office: [place] office: [phone]

## 2019-07-22 ENCOUNTER — Telehealth: Payer: Self-pay

## 2019-07-22 NOTE — Telephone Encounter (Signed)
Patient had virtual visit with Dr. Raoul Pitch on 07/15/19 Patient reports she is not any better.  Please advise. Patient can be reached at [phone]

## 2019-07-22 NOTE — Telephone Encounter (Signed)
Pt was called and told Dr Raliegh Ip is out of town  And would need to be scheduled with another [facility] provider or go to urgent care. Pt states she is now running a fever, cough is worse, and is having SOB. Pt advised to go to urgent care or ER for medical tx now. She verbalized understanding

## 2019-07-26 ENCOUNTER — Telehealth: Payer: Self-pay

## 2019-07-26 NOTE — Telephone Encounter (Signed)
Pt was called and detailed message was left letting pt know this was scheduled medication and could not be refilled W/O a office visit. Told pt to let us know if she went to the urgent care last week after being advised to go and what they RX and [place] they provided.

## 2019-07-26 NOTE — Telephone Encounter (Signed)
Patient request refill on cough meds.   HYHYDROcodone-homatropine (HYCODAN) 5-1.5 MG/5ML syrup IN:[id number]   [facility]

## 2019-07-27 NOTE — Progress Notes (Signed)
Follow Up Note  RE: Lindsay Hamilton MRN: [id number] DOB: [birth year] Date of Office Visit: 07/28/2019  Referring provider: [name], DO Primary care provider: [name], DO  Chief Complaint: Asthma (2 wks ago coughing really bad, given cough medicine with codine she's off the medicine but she still has the cough)  History of Present Illness: I had the pleasure of seeing Lindsay Hamilton for a follow up visit at the [facility] [place] [facility] [place] on 07/28/2019. She is a 54 y.o. female, who is being followed for reactive airway disease, chronic rhinitis and pruritus. Her previous allergy office visit was on 04/21/2019 with Dr. [name]. Today is a regular follow up visit.  Reactive airway disease A few weeks ago patient started to have coughing fits.  She was treated with prednisone, doxycycline and cough syrup with about 60% improvement in symptoms. Symptoms slightly worsened since finished prednisone taper.   Denies any fevers, chills, sick contacts. She has been vaccinated fully for COVID-19.   She still having dry coughing spells throughout the day. Currently on Symbicort 80 2 puffs twice a day and using albuterol twice a day with some benefit.   Patient taking TUMS for heartburn for the past few weeks.   Chronic rhinitis Stopped zyrtec with no worsening symptoms.  Pruritus Resolved.   Assessment and Plan: Lindsay Hamilton is a 54 y.o. female with: Reactive airway disease Past history - Coughing, wheezing, SOB for the past 3 years in January and October for a few months at a time. No specific triggers noted but exertion does make it worse. 2020 spirometry showed mild restriction with no improvement in FEV1 post bronchodilator treatment.  Interim history - had flare in March. 60% improved with prednisone, doxycycline and cough syrup but still dry coughing daily. Fully vaccinated for COVID-19.   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  Today's spirometry showed some mild restriction.   Start prednisone taper.   If your coughing does not improve the next few days then let us know and will send in more cough medicine.   Daily controller medication(s): INCREASE Symbicort 160 2 puffs twice a day with spacer and rinse mouth afterwards. Sample given.   This replaces the Symbicort 80 for now.   Prior to physical activity:May use albuterol rescue inhaler 2 puffs 5 to 15 minutes prior to strenuous physical activities.  Rescue medications:May use albuterol rescue inhaler 2 puffs or nebulizer every 4 to 6 hours as needed for shortness of breath, chest tightness, coughing, and wheezing. Monitor frequency of use.   Repeat spirometry at next visit.   Heartburn Reflux symptoms for the past few weeks but only taking tums.  Start omeprazole 40mg  in the morning for 1 month. Nothing to eat or drink for 30 minutes afterwards.  Heartburn modification diet handout given.   Chronic rhinitis Past history - Rhino conjunctivitis symptoms in the winter, spring and fall for the past 3 years. She had skin testing over 10 years which was positive to grass, mold, trees, cats and dogs per patient report. 2020 skin testing and immunocap was negative to environmental allergies. Interim history - asymptomatic even with stopping zyrtec.   Monitor symptoms.   Return in about 2 months (around 09/27/2019).  Meds ordered this encounter  Medications  . budesonide-formoterol (SYMBICORT) 160-4.5 MCG/ACT inhaler    Sig: Inhale 2 puffs into the lungs in the morning and at bedtime.  Dispense:  1 Inhaler    Refill:  5  . omeprazole (PRILOSEC OTC) 20 MG tablet    Sig: Take 2 tablets (40 mg total) by mouth daily.    Dispense:  60 tablet    Refill:  1   Diagnostics: Spirometry:  Tracings reviewed. Her effort: It was hard to get consistent efforts and there is a question as to whether this reflects a maximal  maneuver. FVC: 2.75L FEV1: 2.41L, 82% predicted FEV1/FVC ratio: 88% Interpretation: Spirometry consistent with possible restrictive disease.  Please see scanned spirometry results for details.  Medication List:  Current Outpatient Medications  Medication Sig Dispense Refill  . albuterol (VENTOLIN HFA) 108 (90 Base) MCG/ACT inhaler Inhale 1-2 puffs into the lungs every 6 (six) hours as needed for wheezing or shortness of breath (every 4-6 hours as needed for wheezing and coughing). 18 g 2  . budesonide-formoterol (SYMBICORT) 80-4.5 MCG/ACT inhaler Inhale 2 puffs into the lungs 2 (two) times daily. 1 Inhaler 3  . buPROPion (WELLBUTRIN XL) 150 MG 24 hr tablet Take 1 tablet (150 mg total) by mouth daily. 90 tablet 1  . clonazePAM (KLONOPIN) 0.5 MG tablet 1/2 tab during the day and 1-2 tabs QHS and 75 tablet 5  . escitalopram (LEXAPRO) 20 MG tablet Take 1 tablet (20 mg total) by mouth at bedtime. 90 tablet 1  . Spacer/Aero-Holding Dorise Bullion Use with Symbicort PRN 1 Device 0  . budesonide-formoterol (SYMBICORT) 160-4.5 MCG/ACT inhaler Inhale 2 puffs into the lungs in the morning and at bedtime. 1 Inhaler 5  . omeprazole (PRILOSEC OTC) 20 MG tablet Take 2 tablets (40 mg total) by mouth daily. 60 tablet 1   No current facility-administered medications for this visit.   Allergies: Allergies  Allergen Reactions  . Ambien [Zolpidem]    I reviewed her past medical history, social history, family history, and environmental history and no significant changes have been reported from her previous visit.  Review of Systems  Constitutional: Negative for appetite change, chills, fever and unexpected weight change.  HENT: Negative for congestion and rhinorrhea.   Eyes: Negative for itching.  Respiratory: Positive for cough and shortness of breath. Negative for chest tightness and wheezing.   Cardiovascular: Negative for chest pain.  Gastrointestinal: Negative for abdominal pain.  Genitourinary:  Negative for difficulty urinating.  Skin: Negative for rash.  Allergic/Immunologic: Negative for environmental allergies and food allergies.  Neurological: Positive for headaches.   Objective: BP 118/70 (BP Location: Right Arm, Patient Position: Sitting, Cuff Size: Normal)   Pulse 68   Temp 98.1 F (36.7 C) (Temporal)   Resp 16   Ht 5' 6.34" (1.685 m)   Wt 221 lb (100.2 kg)   SpO2 96%   BMI 35.31 kg/m  Body mass index is 35.31 kg/m. Physical Exam  Constitutional: She is oriented to person, place, and time. She appears well-developed and well-nourished.  HENT:  Head: Normocephalic and atraumatic.  Right Ear: External ear normal.  Left Ear: External ear normal.  Nose: Nose normal.  Mouth/Throat: Oropharynx is clear and moist.  Eyes: Conjunctivae and EOM are normal.  Cardiovascular: Normal rate, regular rhythm and normal heart sounds. Exam reveals no gallop and no friction rub.  No murmur heard. Pulmonary/Chest: Effort normal and breath sounds normal. She has no wheezes. She has no rales.  Abdominal: Soft.  Musculoskeletal:     Cervical back: Neck supple.  Neurological: She is alert and oriented to person, place, and time.  Skin: Skin is warm. No rash  noted.  Psychiatric: She has a normal mood and affect. Her behavior is normal.  Nursing note and vitals reviewed.  Previous notes and tests were reviewed. The plan was reviewed with the patient/family, and all questions/concerned were addressed.  It was my pleasure to see Lindsay Hamilton today and participate in her care. Please feel free to contact me with any questions or concerns.  Sincerely,  Rexene Alberts, DO Allergy & Immunology  [facility] office: [phone] [facility] office: [place] office: [phone]

## 2019-07-28 ENCOUNTER — Ambulatory Visit: Payer: Federal, State, Local not specified - PPO | Admitting: Allergy

## 2019-07-28 ENCOUNTER — Other Ambulatory Visit: Payer: Self-pay

## 2019-07-28 ENCOUNTER — Encounter: Payer: Self-pay | Admitting: Allergy

## 2019-07-28 VITALS — BP 118/70 | HR 68 | Temp 98.1°F | Resp 16 | Ht 66.34 in | Wt 221.0 lb

## 2019-07-28 DIAGNOSIS — R1031 Right lower quadrant pain: Secondary | ICD-10-CM | POA: Diagnosis not present

## 2019-07-28 DIAGNOSIS — R12 Heartburn: Secondary | ICD-10-CM | POA: Diagnosis not present

## 2019-07-28 DIAGNOSIS — J45909 Unspecified asthma, uncomplicated: Secondary | ICD-10-CM | POA: Insufficient documentation

## 2019-07-28 DIAGNOSIS — J310 Chronic rhinitis: Secondary | ICD-10-CM

## 2019-07-28 DIAGNOSIS — R140 Abdominal distension (gaseous): Secondary | ICD-10-CM | POA: Diagnosis not present

## 2019-07-28 DIAGNOSIS — J4541 Moderate persistent asthma with (acute) exacerbation: Secondary | ICD-10-CM

## 2019-07-28 MED ORDER — OMEPRAZOLE MAGNESIUM 20 MG PO TBEC: 40.0000 mg | DELAYED_RELEASE_TABLET | Freq: Every day | ORAL | 1 refills | Status: DC

## 2019-07-28 MED ORDER — BUDESONIDE-FORMOTEROL FUMARATE 160-4.5 MCG/ACT IN AERO: 2.0000 | INHALATION_SPRAY | Freq: Two times a day (BID) | RESPIRATORY_TRACT | 5 refills | Status: DC

## 2019-07-28 NOTE — Assessment & Plan Note (Addendum)
Past history - Coughing, wheezing, SOB for the past 3 years in January and October for a few months at a time. No specific triggers noted but exertion does make it worse. 2020 spirometry showed mild restriction with no improvement in FEV1 post bronchodilator treatment.  Interim history - had flare in March. 60% improved with prednisone, doxycycline and cough syrup but still dry coughing daily. Fully vaccinated for COVID-19.   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  Today's spirometry showed some mild restriction.   Start prednisone taper.   If your coughing does not improve the next few days then let us know and will send in more cough medicine.   Daily controller medication(s): INCREASE Symbicort 160 2 puffs twice a day with spacer and rinse mouth afterwards. Sample given.   This replaces the Symbicort 80 for now.   Prior to physical activity:May use albuterol rescue inhaler 2 puffs 5 to 15 minutes prior to strenuous physical activities.  Rescue medications:May use albuterol rescue inhaler 2 puffs or nebulizer every 4 to 6 hours as needed for shortness of breath, chest tightness, coughing, and wheezing. Monitor frequency of use.   Repeat spirometry at next visit.

## 2019-07-28 NOTE — Patient Instructions (Addendum)
Reactive airway disease  Start prednisone taper.   If your coughing does not improve the next few days then let us know and will send in more cough medicine.   Daily controller medication(s): INCREASE Symbicort 160 2 puffs twice a day with spacer and rinse mouth afterwards. Sample given.   This replaces the Symbicort 80 for now.   Prior to physical activity:May use albuterol rescue inhaler 2 puffs 5 to 15 minutes prior to strenuous physical activities.  Rescue medications:May use albuterol rescue inhaler 2 puffs or nebulizer every 4 to 6 hours as needed for shortness of breath, chest tightness, coughing, and wheezing. Monitor frequency of use.  Asthma control goals:  Full participation in all desired activities (may need albuterol before activity) Albuterol use two times or less a week on average (not counting use with activity) Cough interfering with sleep two times or less a month Oral steroids no more than once a year No hospitalizations  Heartburn  Start omeprazole 40mg  in the morning. Nothing to eat or drink for 30 minutes afterwards.  See heartburn modification diet below.  Follow up in 2 months or sooner if needed.     Heartburn Heartburn is a type of pain or discomfort that can happen in the throat or chest. It is often described as a burning pain. It may also cause a bad, acid-like taste in the mouth. Heartburn may feel worse when you lie down or bend over. It may be worse at night. It may be caused by stomach contents that move back up (reflux) into the tube that connects the mouth with the stomach (esophagus). Follow these instructions at home: Eating and drinking   Avoid certain foods and drinks as told by your doctor. This may include: ? Coffee and tea (with or without caffeine). ? Drinks that have alcohol. ? Energy drinks and sports drinks. ? Carbonated drinks or sodas. ? Chocolate and cocoa. ? Peppermint and mint flavorings. ? Garlic and  onions. ? Horseradish. ? Spicy and acidic foods, such as:  Peppers.  Chili powder and curry powder.  Vinegar.  Hot sauces and BBQ sauce. ? Citrus fruit juices and citrus fruits, such as:  Oranges.  Lemons.  Limes. ? Tomato-based foods, such as:  Red sauce and pizza with red sauce.  Chili.  Salsa. ? Fried and fatty foods, such as:  Donuts.  [place] fries and potato chips.  High-fat dressings. ? High-fat meats, such as:  Hot dogs and sausage.  Rib eye steak.  Ham and bacon. ? High-fat dairy items, such as:  Whole milk.  Butter.  Cream cheese.  Eat small meals often. Avoid eating large meals.  Avoid drinking large amounts of liquid with your meals.  Avoid eating meals during the 2-3 hours before bedtime.  Avoid lying down right after you eat.  Do not exercise right after you eat. Lifestyle      If you are overweight, lose an amount of weight that is healthy for you. Ask your doctor about a safe weight loss goal.  Do not use any products that contain nicotine or tobacco, including cigarettes, e-cigarettes, and chewing tobacco. These can make your symptoms worse. If you need help quitting, ask your doctor.  Wear loose clothes. Do not wear anything tight around your waist.  Raise (elevate) the head of your bed about 6 inches (15 cm) when you sleep.  Try to lower your stress. If you need help doing this, ask your doctor. General instructions  Pay attention to any  changes in your symptoms.  Take over-the-counter and prescription medicines only as told by your doctor. ? Do not take aspirin, ibuprofen, or other NSAIDs unless your doctor says it is okay. ? Stop medicines only as told by your doctor.  Keep all follow-up visits as told by your doctor. This is important. Contact a doctor if:  You have new symptoms.  You lose weight and you do not know why it is happening.  You have trouble swallowing, or it hurts to swallow.  You have wheezing  or a cough that keeps happening.  Your symptoms do not get better with treatment.  You have heartburn often for more than 2 weeks. Get help right away if:  You have pain in your arms, neck, jaw, teeth, or back.  You feel sweaty, dizzy, or light-headed.  You have chest pain or shortness of breath.  You throw up (vomit) and your throw up looks like blood or coffee grounds.  Your poop (stool) is bloody or black. These symptoms may represent a serious problem that is an emergency. Do not wait to see if the symptoms will go away. Get medical help right away. Call your local emergency services (911 in the [place] Do not drive yourself to the hospital. Summary  Heartburn is a type of pain that can happen in the throat or chest. It can feel like a burning pain. It may also cause a bad, acid-like taste in the mouth.  You may need to avoid certain foods and drinks to help your symptoms. Ask your doctor what foods and drinks you should avoid.  Take over-the-counter and prescription medicines only as told by your doctor. Do not take aspirin, ibuprofen, or other NSAIDs unless your doctor told you to do so.  Contact your doctor if your symptoms do not get better or they get worse. This information is not intended to replace advice given to you by your health care provider. Make sure you discuss any questions you have with your health care provider. Document Revised: 09/21/2017 Document Reviewed: 09/21/2017 Elsevier Patient Education  [place].

## 2019-07-28 NOTE — Assessment & Plan Note (Addendum)
Reflux symptoms for the past few weeks but only taking tums.  Start omeprazole 40mg  in the morning for 1 month. Nothing to eat or drink for 30 minutes afterwards.  Heartburn modification diet handout given.

## 2019-07-28 NOTE — Assessment & Plan Note (Signed)
Past history - Rhino conjunctivitis symptoms in the winter, spring and fall for the past 3 years. She had skin testing over 10 years which was positive to grass, mold, trees, cats and dogs per patient report. 2020 skin testing and immunocap was negative to environmental allergies. Interim history - asymptomatic even with stopping zyrtec.   Monitor symptoms.

## 2019-08-01 ENCOUNTER — Telehealth: Payer: Self-pay | Admitting: Allergy

## 2019-08-01 NOTE — Telephone Encounter (Signed)
Patient saw Dr. [name], in [place], on 07/28/19. She said Dr. [name] told her if her cough got worse to call and ask [place] to send in some cough medicine. She still has a pretty bad cough[facility] [place]

## 2019-08-01 NOTE — Telephone Encounter (Signed)
Dr. Ernst Bowler please advise since Dr. [name] is out of office today.

## 2019-08-02 DIAGNOSIS — K219 Gastro-esophageal reflux disease without esophagitis: Secondary | ICD-10-CM | POA: Diagnosis not present

## 2019-08-02 MED ORDER — BENZONATATE 100 MG PO CAPS: 100.0000 mg | ORAL_CAPSULE | Freq: Three times a day (TID) | ORAL | 0 refills | Status: DC | PRN

## 2019-08-02 NOTE — Telephone Encounter (Signed)
Sent in Lindsay Hamilton.  I apologize for missing this yesterday.  Salvatore Marvel, MD [facility] [place] [facility] [place]

## 2019-08-02 NOTE — Telephone Encounter (Signed)
Called patient and advised of medication being sent to pharmacy. Advised to call back if she was not feeling better. Patient verbalized understanding.

## 2019-08-08 DIAGNOSIS — M722 Plantar fascial fibromatosis: Secondary | ICD-10-CM | POA: Insufficient documentation

## 2019-09-05 DIAGNOSIS — Z1159 Encounter for screening for other viral diseases: Secondary | ICD-10-CM | POA: Diagnosis not present

## 2019-09-08 DIAGNOSIS — Z86010 Personal history of colonic polyps: Secondary | ICD-10-CM | POA: Diagnosis not present

## 2019-09-08 LAB — HM COLONOSCOPY

## 2019-09-15 ENCOUNTER — Encounter: Payer: Self-pay | Admitting: Family Medicine

## 2019-09-19 DIAGNOSIS — R1084 Generalized abdominal pain: Secondary | ICD-10-CM | POA: Diagnosis not present

## 2019-09-22 ENCOUNTER — Other Ambulatory Visit (HOSPITAL_COMMUNITY): Payer: Self-pay | Admitting: General Surgery

## 2019-09-22 ENCOUNTER — Other Ambulatory Visit: Payer: Self-pay | Admitting: General Surgery

## 2019-09-22 DIAGNOSIS — R1084 Generalized abdominal pain: Secondary | ICD-10-CM

## 2019-09-29 ENCOUNTER — Ambulatory Visit: Payer: Federal, State, Local not specified - PPO | Admitting: Allergy

## 2019-10-05 ENCOUNTER — Encounter (HOSPITAL_COMMUNITY)
Admission: RE | Admit: 2019-10-05 | Discharge: 2019-10-05 | Disposition: A | Discharge status: Home / Self Care | Payer: Federal, State, Local not specified - PPO | Source: Ambulatory Visit | Attending: General Surgery | Admitting: General Surgery

## 2019-10-05 ENCOUNTER — Other Ambulatory Visit: Payer: Self-pay

## 2019-10-05 DIAGNOSIS — R1084 Generalized abdominal pain: Secondary | ICD-10-CM | POA: Diagnosis not present

## 2019-10-05 DIAGNOSIS — R109 Unspecified abdominal pain: Secondary | ICD-10-CM | POA: Diagnosis not present

## 2019-10-05 MED ORDER — TECHNETIUM TC 99M SULFUR COLLOID
2.0000 | Freq: Once | INTRAVENOUS | Status: AC | PRN
  Administered 2019-10-05: 2 via INTRAVENOUS

## 2019-10-06 ENCOUNTER — Ambulatory Visit: Payer: Federal, State, Local not specified - PPO | Admitting: Allergy

## 2019-10-11 ENCOUNTER — Encounter (HOSPITAL_COMMUNITY): Payer: Federal, State, Local not specified - PPO

## 2019-10-13 ENCOUNTER — Other Ambulatory Visit: Payer: Self-pay | Admitting: General Surgery

## 2019-10-13 DIAGNOSIS — R1084 Generalized abdominal pain: Secondary | ICD-10-CM

## 2019-10-18 ENCOUNTER — Ambulatory Visit
Admission: RE | Admit: 2019-10-18 | Discharge: 2019-10-18 | Disposition: A | Discharge status: Home / Self Care | Payer: Federal, State, Local not specified - PPO | Source: Ambulatory Visit | Attending: General Surgery | Admitting: General Surgery

## 2019-10-18 DIAGNOSIS — K219 Gastro-esophageal reflux disease without esophagitis: Secondary | ICD-10-CM | POA: Diagnosis not present

## 2019-10-18 DIAGNOSIS — R1084 Generalized abdominal pain: Secondary | ICD-10-CM

## 2019-10-26 DIAGNOSIS — K430 Incisional hernia with obstruction, without gangrene: Secondary | ICD-10-CM | POA: Diagnosis not present

## 2019-11-08 DIAGNOSIS — K432 Incisional hernia without obstruction or gangrene: Secondary | ICD-10-CM | POA: Diagnosis not present

## 2019-12-23 ENCOUNTER — Other Ambulatory Visit: Payer: Self-pay

## 2019-12-23 ENCOUNTER — Ambulatory Visit (INDEPENDENT_AMBULATORY_CARE_PROVIDER_SITE_OTHER): Payer: Federal, State, Local not specified - PPO | Admitting: Family Medicine

## 2019-12-23 DIAGNOSIS — Z23 Encounter for immunization: Secondary | ICD-10-CM

## 2020-01-20 ENCOUNTER — Telehealth: Payer: Self-pay

## 2020-01-20 NOTE — Telephone Encounter (Signed)
Clonazepam is a controlled substance.   No refills can be made without a face-to-face visit.  I understand patient may have an appointment coming up in 2 weeks and we can certainly refill it for her during that appointment.  If she is going to run out of this medication before her appointment and needs refills, please try to schedule her at a sooner time.

## 2020-01-20 NOTE — Telephone Encounter (Signed)
Requesting: clonazepam 0.5mg  Contract:n/a UDS:n/a Last Visit:07/15/19 Next Visit:10/421 Last Refill: 07/06/19 (75,5)  Please Advise

## 2020-01-23 NOTE — Telephone Encounter (Signed)
Spoke with pt to see if she is willing to schedule for tomorrow and pt declined reschedule.

## 2020-01-23 NOTE — Telephone Encounter (Signed)
Patient is rescheduled to this Friday at 1 pm, but does want to know if it would be possible to get a small refill to last.

## 2020-01-27 ENCOUNTER — Other Ambulatory Visit: Payer: Self-pay | Admitting: Family Medicine

## 2020-01-27 ENCOUNTER — Encounter: Payer: Self-pay | Admitting: Family Medicine

## 2020-01-27 ENCOUNTER — Ambulatory Visit (INDEPENDENT_AMBULATORY_CARE_PROVIDER_SITE_OTHER): Payer: Federal, State, Local not specified - PPO | Admitting: Family Medicine

## 2020-01-27 ENCOUNTER — Other Ambulatory Visit: Payer: Self-pay

## 2020-01-27 VITALS — BP 109/73 | HR 63 | Temp 98.2°F | Resp 16 | Wt 217.2 lb

## 2020-01-27 DIAGNOSIS — F418 Other specified anxiety disorders: Secondary | ICD-10-CM | POA: Diagnosis not present

## 2020-01-27 DIAGNOSIS — R7303 Prediabetes: Secondary | ICD-10-CM

## 2020-01-27 MED ORDER — ESCITALOPRAM OXALATE 20 MG PO TABS: 20.0000 mg | ORAL_TABLET | Freq: Every day | ORAL | 1 refills | Status: DC

## 2020-01-27 MED ORDER — BUPROPION HCL ER (XL) 150 MG PO TB24: 150.0000 mg | ORAL_TABLET | Freq: Every day | ORAL | 1 refills | Status: DC

## 2020-01-27 MED ORDER — CLONAZEPAM 0.5 MG PO TABS: ORAL_TABLET | ORAL | 5 refills | Status: DC

## 2020-01-27 NOTE — Progress Notes (Signed)
This visit occurred during the SARS-CoV-2 public health emergency.  Safety protocols were in place, including screening questions prior to the visit, additional usage of staff PPE, and extensive cleaning of exam room while observing appropriate contact time as indicated for disinfecting solutions.    Patient ID: Lindsay Hamilton, female  DOB: 1965/10/10, 54 y.o.   MRN: [id number] Patient Care Team    Relationship Specialty Notifications Start End  [name], DO PCP - General Family Medicine  04/15/19   Abbie Sons, MD Referring Physician Psychiatry  09/14/13   Garnet Sierras, DO Consulting Physician Allergy  03/17/19   Clarene Essex, MD Consulting Physician Gastroenterology  04/15/19   Vickey Huger, MD Consulting Physician Orthopedic Surgery  04/15/19   Donia Ast, [place]  Orthopedic Surgery  04/15/19   Roel Cluck, MD Referring Physician Ophthalmology  04/15/19   Lelon Perla, MD Consulting Physician Cardiology  04/15/19   Kathrynn Ducking, MD Consulting Physician Neurology  04/15/19   Arvella Nigh, MD Consulting Physician Obstetrics and Gynecology  04/15/19   Juluis Rainier  Optometry  04/15/19   Clabe Seal, DMD  Orthodontics  04/15/19     Chief Complaint  Patient presents with  . Medication Refill    Subjective: Lindsay Hamilton is a 54 y.o.  Female  present for  Insomnia, unspecified type/Depression with anxiety Patient states med regimen is working great. She will need refills today.  She reports compliance with Wellbutrin 150 mg daily and Lexapro 20 mg daily. Compliant with klonopin 0.5 mg 1/2 tab in the day and 1-2 tabs qhs.  Prior note: Patient reports overall she is doing well since the changes in medication.  She is sleeping well with using the Klonopin 0.5 mg nightly.  She has not used the [place] and feels she is getting good sleep with the Klonopin.  She does endorse having some increase in her emotional state, feeling more tearful at  times since stopping the Lunesta and the Xanax.  She has been taking the Xanax twice daily.  However she has only been taking the Klonopin at night.  It was written to allow her to have 1 Klonopin before bed and a half a tab during the day.  She states she has not been taking the Klonopin during the day.   Depression screen [facility] 2/9 01/27/2020 05/11/2019 04/15/2019  Decreased Interest 0 3 0  Down, Depressed, Hopeless 0 3 0  PHQ - 2 Score 0 6 0  Altered sleeping 0 0 -  Tired, decreased energy 0 3 -  Change in appetite 0 0 -  Feeling bad or failure about yourself  0 0 -  Trouble concentrating 0 0 -  Moving slowly or fidgety/restless 0 0 -  Suicidal thoughts 0 0 -  PHQ-9 Score 0 9 -  Difficult doing work/chores - Not difficult at all -   GAD 7 : Generalized Anxiety Score 01/27/2020 05/11/2019  Nervous, Anxious, on Edge 0 3  Control/stop worrying 0 0  Worry too much - different things 0 0  Trouble relaxing 0 3  Restless 0 0  Easily annoyed or irritable 3 3  Afraid - awful might happen 0 0  Total GAD 7 Score 3 9  Anxiety Difficulty Not difficult at all Not difficult at all    Immunization History  Administered Date(s) Administered  . Influenza-Unspecified 02/16/2019  . PFIZER SARS-COV-2 Vaccination 05/17/2019, 06/07/2019  . Tdap 04/08/2013  . Zoster Recombinat (Shingrix) 07/05/2019,  12/23/2019   Past Medical History:  Diagnosis Date  . Alcohol abuse, in remission   . Depression   . Head injury   . Headaches due to old head injury   . Insomnia 09/14/2013  . Migraine without aura, without mention of intractable migraine without mention of status migrainosus 09/14/2013  . Obese   . Pancreatitis   . Pseudoseizures   . Tubular adenoma of colon 2018   Allergies  Allergen Reactions  . Ambien [Zolpidem]    Past Surgical History:  Procedure Laterality Date  . ABDOMINAL HYSTERECTOMY  2009   Partial  . CARPAL TUNNEL RELEASE  1998  . CESAREAN SECTION     x2-1992, 1997  .  CHOLECYSTECTOMY  2002  . COLONOSCOPY W/ BIOPSIES  05/2016   tubular adenoma  . GREAT TOE ARTHRODESIS, METATARSALPHALANGEAL JOINT Right 07/17/2018  . KNEE ARTHROSCOPY     bilateral, 3 right and 3 left  . SHOULDER ARTHROSCOPY Bilateral    Right 2012, left 2014   Family History  Problem Relation Age of Onset  . Hypertension Mother   . CAD Mother        Died of MI at age 66  . Asthma Mother   . Pulmonary embolism Mother        x2  . Cancer - Prostate Father   . Atrial fibrillation Father   . Hyperlipidemia Father   . Seizures Sister   . Migraines Sister   . Hypertension Brother   . Hypertension Brother   . Lung cancer Paternal Grandmother   . Breast cancer Neg Hx   . Allergic rhinitis Neg Hx   . Eczema Neg Hx   . Urticaria Neg Hx    Social History   Social History Narrative   Marital status/children/pets: Married.  2 children.   Education/employment: High school graduate with some college.  Works as a Stage manager.   Safety:      -smoke alarm in the home:Yes     - wears seatbelt: Yes     - Feels safe in their relationships: Yes    Allergies as of 01/27/2020      Reactions   Ambien [zolpidem]       Medication List       Accurate as of January 27, 2020  2:38 PM. If you have any questions, ask your nurse or doctor.        STOP taking these medications   albuterol 108 (90 Base) MCG/ACT inhaler Commonly known as: VENTOLIN HFA Stopped by: Howard Pouch, DO   benzonatate 100 MG capsule Commonly known as: Best boy Stopped by: Howard Pouch, DO   budesonide-formoterol 160-4.5 MCG/ACT inhaler Commonly known as: Symbicort Stopped by: Howard Pouch, DO   budesonide-formoterol 80-4.5 MCG/ACT inhaler Commonly known as: Symbicort Stopped by: Howard Pouch, DO   omeprazole 20 MG tablet Commonly known as: PRILOSEC OTC Stopped by: Howard Pouch, DO   Spacer/Aero-Holding Chambers Kerrin Mo Stopped by: Howard Pouch, DO     TAKE these  medications   buPROPion 150 MG 24 hr tablet Commonly known as: WELLBUTRIN XL Take 1 tablet (150 mg total) by mouth daily.   clonazePAM 0.5 MG tablet Commonly known as: KLONOPIN 1/2 tab during the day and 1-2 tabs QHS and   escitalopram 20 MG tablet Commonly known as: LEXAPRO Take 1 tablet (20 mg total) by mouth at bedtime.       All past medical history, surgical history, allergies, family history, immunizations andmedications were updated in the EMR today  and reviewed under the history and medication portions of their EMR.      ROS: 14 pt review of systems performed and negative (unless mentioned in an HPI)  Objective: BP 109/73 (BP Location: Left Arm, Patient Position: Sitting, Cuff Size: Large)   Pulse 63   Temp 98.2 F (36.8 C) (Oral)   Resp 16   Wt 217 lb 3.2 oz (98.5 kg)   SpO2 97%   BMI 34.70 kg/m  Gen: Afebrile. No acute distress.  HENT: AT. [place].  Eyes:Pupils Equal Round Reactive to light, Extraocular movements intact,  Conjunctiva without redness, discharge or icterus. CV: RRR  Chest: CTAB Neuro:Normal gait. PERLA. EOMi. Alert. Oriented. Psych: Normal affect, dress and demeanor. Normal speech. Normal thought content and judgment..   Assessment/plan: Miryah Ralls is a 54 y.o. female present for CPE Insomnia, unspecified type/Depression with anxiety Stable.  Continue Wellbutrin 150 mg daily.   Continue Lexapro 20 mg daily. Continue  Klonopin with mild increase in night dose 0.5-1 mg nightly and use half a tab 12 hours later if needed only.  Discontinued Lunesta and Xanax -04/15/2019. [place] controlled substance database reviewed 01/27/20 controlled substance signed uds collected today Patient counseled on addictive properties of benzodiazepines. Follow-up 5-6 months (cpe), sooner if needed   Return in about 5 months (around 07/12/2020) for CPE (30 min)/cmc.  Orders Placed This Encounter  Procedures  . DRUG MONITORING, PANEL 8 WITH  CONFIRMATION, URINE      Meds ordered this encounter  Medications  . escitalopram (LEXAPRO) 20 MG tablet    Sig: Take 1 tablet (20 mg total) by mouth at bedtime.    Dispense:  90 tablet    Refill:  1    Please DC other scripts for this med- provider change. Thanks !!  . buPROPion (WELLBUTRIN XL) 150 MG 24 hr tablet    Sig: Take 1 tablet (150 mg total) by mouth daily.    Dispense:  90 tablet    Refill:  1  . clonazePAM (KLONOPIN) 0.5 MG tablet    Sig: 1/2 tab during the day and 1-2 tabs QHS and    Dispense:  75 tablet    Refill:  5   Referral Orders  No referral(s) requested today     Electronically signed by: Howard Pouch, [place]

## 2020-01-27 NOTE — Patient Instructions (Addendum)
I have refilled your medications.  Next appt physical mid March.   Glad you are doing well.

## 2020-02-02 LAB — DRUG MONITORING, PANEL 8 WITH CONFIRMATION, URINE
6 Acetylmorphine: NEGATIVE ng/mL (ref <10)
Alcohol Metabolites: NEGATIVE ng/mL
Alphahydroxyalprazolam: 37 ng/mL — ABNORMAL HIGH (ref <25)
Alphahydroxymidazolam: NEGATIVE ng/mL (ref <50)
Alphahydroxytriazolam: NEGATIVE ng/mL (ref <50)
Aminoclonazepam: 62 ng/mL — ABNORMAL HIGH (ref <25)
Amphetamines: NEGATIVE ng/mL (ref <500)
Benzodiazepines: POSITIVE ng/mL — AB (ref <100)
Buprenorphine, Urine: NEGATIVE ng/mL (ref <5)
Cocaine Metabolite: NEGATIVE ng/mL (ref <150)
Creatinine: 47.6 mg/dL
Hydroxyethylflurazepam: NEGATIVE ng/mL (ref <50)
Lorazepam: NEGATIVE ng/mL (ref <50)
MDMA: NEGATIVE ng/mL (ref <500)
Marijuana Metabolite: 563 ng/mL — ABNORMAL HIGH (ref <5)
Marijuana Metabolite: POSITIVE ng/mL — AB (ref <20)
Nordiazepam: NEGATIVE ng/mL (ref <50)
Opiates: NEGATIVE ng/mL (ref <100)
Oxazepam: NEGATIVE ng/mL (ref <50)
Oxidant: NEGATIVE ug/mL
Oxycodone: NEGATIVE ng/mL (ref <100)
Temazepam: NEGATIVE ng/mL (ref <50)
pH: 5.2 (ref 4.5–9.0)

## 2020-02-02 LAB — DM TEMPLATE

## 2020-02-06 ENCOUNTER — Ambulatory Visit: Payer: Federal, State, Local not specified - PPO | Admitting: Family Medicine

## 2020-02-20 DIAGNOSIS — L600 Ingrowing nail: Secondary | ICD-10-CM

## 2020-02-20 HISTORY — DX: Ingrowing nail: L60.0

## 2020-02-22 DIAGNOSIS — Z1159 Encounter for screening for other viral diseases: Secondary | ICD-10-CM | POA: Diagnosis not present

## 2020-03-13 DIAGNOSIS — Z1159 Encounter for screening for other viral diseases: Secondary | ICD-10-CM | POA: Diagnosis not present

## 2020-03-22 DIAGNOSIS — M1812 Unilateral primary osteoarthritis of first carpometacarpal joint, left hand: Secondary | ICD-10-CM | POA: Diagnosis not present

## 2020-04-05 DIAGNOSIS — Z1159 Encounter for screening for other viral diseases: Secondary | ICD-10-CM | POA: Diagnosis not present

## 2020-04-09 ENCOUNTER — Telehealth: Payer: Self-pay | Admitting: Family Medicine

## 2020-04-09 ENCOUNTER — Telehealth (INDEPENDENT_AMBULATORY_CARE_PROVIDER_SITE_OTHER): Payer: Federal, State, Local not specified - PPO | Admitting: Family Medicine

## 2020-04-09 ENCOUNTER — Encounter: Payer: Self-pay | Admitting: Family Medicine

## 2020-04-09 DIAGNOSIS — J209 Acute bronchitis, unspecified: Secondary | ICD-10-CM

## 2020-04-09 MED ORDER — HYDROCODONE-HOMATROPINE 5-1.5 MG PO TABS: 1.0000 | ORAL_TABLET | Freq: Three times a day (TID) | ORAL | 0 refills | Status: DC | PRN

## 2020-04-09 MED ORDER — PREDNISONE 20 MG PO TABS
ORAL_TABLET | ORAL | 0 refills | Status: DC
Start: 2020-04-09 — End: 2020-06-26

## 2020-04-09 MED ORDER — DOXYCYCLINE HYCLATE 100 MG PO TABS: 100.0000 mg | ORAL_TABLET | Freq: Two times a day (BID) | ORAL | 0 refills | Status: DC

## 2020-04-09 MED ORDER — HYDROCODONE-HOMATROPINE 5-1.5 MG/5ML PO SYRP: 5.0000 mL | ORAL_SOLUTION | Freq: Three times a day (TID) | ORAL | 0 refills | Status: DC | PRN

## 2020-04-09 NOTE — Patient Instructions (Signed)

## 2020-04-09 NOTE — Addendum Note (Signed)
Addended by: Howard Pouch A on: 04/09/2020 04:28 PM   Modules accepted: Orders

## 2020-04-09 NOTE — Telephone Encounter (Signed)
Patient advised and voiced understanding.  

## 2020-04-09 NOTE — Telephone Encounter (Signed)
Hycodan tabs prescribed in place of syrup. Please make pt aware.  thanks

## 2020-04-09 NOTE — Telephone Encounter (Signed)
[facility] hy[facility]an [facility] and tussionex

## 2020-04-09 NOTE — Telephone Encounter (Signed)
Patient had virtual appt today and Dr. Raoul Pitch called in three prescriptions. Patient states the pharmacy does not have the Hycodan in stock and neither do their other locations. Please change to a different cough syrup.

## 2020-04-09 NOTE — Progress Notes (Signed)
VIRTUAL VISIT VIA VIDEO  I connected with Lindsay Hamilton on 04/09/20 at  1:30 PM EST by elemedicine application and verified that I am speaking with the correct person using two identifiers. Location patient: Home Location provider: [facility], Office Persons participating in the virtual visit: Patient, Dr. Raoul Pitch and Samul Dada, CMA  I discussed the limitations of evaluation and management by telemedicine and the availability of in person appointments. The patient expressed understanding and agreed to proceed.   SUBJECTIVE Chief Complaint  Patient presents with  . Cough    pt c/o sore throat, headache, runny nose, productive cough unk tint, and chills x 10 days; Neg COVID test 4 days    HPI: Lindsay Hamilton is a 54 y.o. female present for acute concern. Pt reports > 10 day h/o productive cough, headache, runny nose, chills and sore throat. She had a negative covid test 5-7 days into illness. She feels fatigue and congested. She denies current fever or shortness of breath.  Pfizer series UTD w/ Booster.  She has tried Mucinex, benadryl, nyquil, whiskey w/ honey.   ROS: See pertinent positives and negatives per HPI.  Patient Active Problem List   Diagnosis Date Noted  . Ingrowing nail 02/20/2020  . Plantar fasciitis of right foot 08/08/2019  . Laryngopharyngeal reflux (LPR) 08/02/2019  . Heartburn 07/28/2019  . Reactive airway disease 07/28/2019  . Encounter for preventive health examination 07/05/2019  . Obesity (BMI 30-39.9) 07/05/2019  . Abdominal cramping 05/11/2019  . Prediabetes 04/15/2019  . Depression with anxiety 04/15/2019  . Coughing 03/17/2019  . Chronic rhinitis 03/17/2019  . Tubular adenoma of colon 2018  . Murmur 11/01/2014  . Migraine without aura 09/14/2013  . Insomnia 09/14/2013    Social History   Tobacco Use  . Smoking status: Former Smoker    Quit date: 1997    Years since quitting: 24.9  . Smokeless tobacco: Never Used    Substance Use Topics  . Alcohol use: Yes    Comment: social    Current Outpatient Medications:  .  buPROPion (WELLBUTRIN XL) 150 MG 24 hr tablet, Take 1 tablet (150 mg total) by mouth daily., Disp: 90 tablet, Rfl: 1 .  clonazePAM (KLONOPIN) 0.5 MG tablet, 1/2 tab during the day and 1-2 tabs QHS and, Disp: 75 tablet, Rfl: 5 .  escitalopram (LEXAPRO) 20 MG tablet, Take 1 tablet (20 mg total) by mouth at bedtime., Disp: 90 tablet, Rfl: 1 .  doxycycline (VIBRA-TABS) 100 MG tablet, Take 1 tablet (100 mg total) by mouth 2 (two) times daily., Disp: 20 tablet, Rfl: 0 .  HYDROcodone-homatropine (HYCODAN) 5-1.5 MG/5ML syrup, Take 5 mLs by mouth every 8 (eight) hours as needed for cough., Disp: 120 mL, Rfl: 0 .  predniSONE (DELTASONE) 20 MG tablet, 60 mg x3d, 40 mg x3d, 20 mg x2d, 10 mg x2d, Disp: 18 tablet, Rfl: 0  Allergies  Allergen Reactions  . Ambien [Zolpidem]     OBJECTIVE: There were no vitals taken for this visit. Gen: No acute distress. Nontoxic in appearance. Obvious nasal congestion.  HENT: AT. [place].  MMM.  Eyes:Pupils Equal Round Reactive to light, Extraocular movements intact,  Conjunctiva without redness, discharge or icterus. Chest: Cough present- hearch, barking cough. No shortness of breath Skin: no rashes, purpura or petechiae.  Neuro:  Normal gait. Alert. Oriented x3  Psych: Normal affect and demeanor. Normal speech. Normal thought content and judgment.  ASSESSMENT AND PLAN: Lindsay Hamilton is a 54 y.o. female present  for  Acute bronchitis with symptoms > 10 days  Rest, hydrate.  Continue  mucinex (DM if cough), nettie pot or nasal saline.  Continue benadryl Doxy BID and prednisone prescribed, take until completed.  Hycodan for cough- no refills.  If cough present it can last up to 6-8 weeks.  Work excuse for 3 days posted to her email account per her request F/U 2 weeks of not improved.  Howard Pouch, DO 04/09/2020   Return if symptoms worsen or fail to  improve.  No orders of the defined types were placed in this encounter.  Meds ordered this encounter  Medications  . doxycycline (VIBRA-TABS) 100 MG tablet    Sig: Take 1 tablet (100 mg total) by mouth 2 (two) times daily.    Dispense:  20 tablet    Refill:  0  . predniSONE (DELTASONE) 20 MG tablet    Sig: 60 mg x3d, 40 mg x3d, 20 mg x2d, 10 mg x2d    Dispense:  18 tablet    Refill:  0  . HYDROcodone-homatropine (HYCODAN) 5-1.5 MG/5ML syrup    Sig: Take 5 mLs by mouth every 8 (eight) hours as needed for cough.    Dispense:  120 mL    Refill:  0   Referral Orders  No referral(s) requested today

## 2020-04-09 NOTE — Telephone Encounter (Signed)
Do they have the hycodan tabs or tussionex?

## 2020-04-09 NOTE — Telephone Encounter (Signed)
Verified with pharmacy that [facility]codan syrup [facility]. Please advise, thanks.

## 2020-04-11 ENCOUNTER — Ambulatory Visit: Payer: Federal, State, Local not specified - PPO

## 2020-04-17 ENCOUNTER — Ambulatory Visit
Admission: RE | Admit: 2020-04-17 | Discharge: 2020-04-17 | Disposition: A | Discharge status: Home / Self Care | Payer: Federal, State, Local not specified - PPO | Source: Ambulatory Visit | Attending: Family Medicine | Admitting: Family Medicine

## 2020-04-17 ENCOUNTER — Other Ambulatory Visit: Payer: Self-pay

## 2020-04-17 DIAGNOSIS — Z1231 Encounter for screening mammogram for malignant neoplasm of breast: Secondary | ICD-10-CM

## 2020-05-05 HISTORY — PX: HAND ARTHROPLASTY: SHX968

## 2020-05-05 HISTORY — PX: UPPER GASTROINTESTINAL ENDOSCOPY: SHX188

## 2020-05-15 DIAGNOSIS — Z01419 Encounter for gynecological examination (general) (routine) without abnormal findings: Secondary | ICD-10-CM | POA: Diagnosis not present

## 2020-06-19 DIAGNOSIS — G8929 Other chronic pain: Secondary | ICD-10-CM | POA: Insufficient documentation

## 2020-06-20 DIAGNOSIS — M1712 Unilateral primary osteoarthritis, left knee: Secondary | ICD-10-CM | POA: Diagnosis not present

## 2020-06-26 ENCOUNTER — Encounter: Payer: Self-pay | Admitting: Family Medicine

## 2020-06-26 ENCOUNTER — Telehealth (INDEPENDENT_AMBULATORY_CARE_PROVIDER_SITE_OTHER): Payer: Federal, State, Local not specified - PPO | Admitting: Family Medicine

## 2020-06-26 DIAGNOSIS — R059 Cough, unspecified: Secondary | ICD-10-CM

## 2020-06-26 DIAGNOSIS — R1011 Right upper quadrant pain: Secondary | ICD-10-CM | POA: Insufficient documentation

## 2020-06-26 DIAGNOSIS — K589 Irritable bowel syndrome without diarrhea: Secondary | ICD-10-CM | POA: Insufficient documentation

## 2020-06-26 DIAGNOSIS — E785 Hyperlipidemia, unspecified: Secondary | ICD-10-CM | POA: Insufficient documentation

## 2020-06-26 DIAGNOSIS — K30 Functional dyspepsia: Secondary | ICD-10-CM | POA: Insufficient documentation

## 2020-06-26 DIAGNOSIS — R519 Headache, unspecified: Secondary | ICD-10-CM

## 2020-06-26 DIAGNOSIS — J209 Acute bronchitis, unspecified: Secondary | ICD-10-CM

## 2020-06-26 DIAGNOSIS — J45909 Unspecified asthma, uncomplicated: Secondary | ICD-10-CM | POA: Diagnosis not present

## 2020-06-26 HISTORY — DX: Right upper quadrant pain: R10.11

## 2020-06-26 HISTORY — DX: Hyperlipidemia, unspecified: E78.5

## 2020-06-26 MED ORDER — PREDNISONE 50 MG PO TABS: 50.0000 mg | ORAL_TABLET | Freq: Every day | ORAL | 0 refills | Status: DC

## 2020-06-26 MED ORDER — AMOXICILLIN-POT CLAVULANATE 875-125 MG PO TABS: 1.0000 | ORAL_TABLET | Freq: Two times a day (BID) | ORAL | 0 refills | Status: DC

## 2020-06-26 MED ORDER — HYDROCODONE-HOMATROPINE 5-1.5 MG/5ML PO SYRP: 5.0000 mL | ORAL_SOLUTION | Freq: Three times a day (TID) | ORAL | 0 refills | Status: DC | PRN

## 2020-06-26 NOTE — Patient Instructions (Signed)

## 2020-06-26 NOTE — Progress Notes (Signed)
VIRTUAL VISIT VIA VIDEO  I connected with Lindsay Hamilton on 06/26/20 at  4:00 PM EST by elemedicine application and verified that I am speaking with the correct person using two identifiers. Location patient: Home Location provider: [facility], Office Persons participating in the virtual visit: Patient, Dr. Raoul Hamilton and Lindsay Hamilton, CMA  I discussed the limitations of evaluation and management by telemedicine and the availability of in person appointments. The patient expressed understanding and agreed to proceed.   SUBJECTIVE Chief Complaint  Patient presents with  . Cough    Pt c/o dry cough, sore throat, HA, loss of appetite, nasal congestion x 1.5 week; Neg at home COVD test on 2/18    HPI: Lindsay Hamilton is a 55 y.o. pt present with dry cough, sore throat, headaches, loss of appetite, chills, and nasal congestion 1 week. She denies fever, vomit, diarrhea. Works in Art gallery manager and a Surveyor, minerals. covid vaccine x3 Neg home covid test 2/18 Sx onset 06/19/2020. OTC mucinex DM.   ROS: See pertinent positives and negatives per HPI.  Patient Active Problem List   Diagnosis Date Noted  . Stomach upset 06/26/2020  . Right upper quadrant pain 06/26/2020  . Irritable bowel syndrome 06/26/2020  . Hyperlipidemia 06/26/2020  . Chronic pain of left knee 06/19/2020  . Ingrowing nail 02/20/2020  . Plantar fasciitis of right foot 08/08/2019  . Laryngopharyngeal reflux (LPR) 08/02/2019  . Heartburn 07/28/2019  . Reactive airway disease 07/28/2019  . Encounter for preventive health examination 07/05/2019  . Obesity (BMI 30-39.9) 07/05/2019  . Abdominal cramping 05/11/2019  . Prediabetes 04/15/2019  . Depression with anxiety 04/15/2019  . Coughing 03/17/2019  . Chronic rhinitis 03/17/2019  . Tubular adenoma of colon 2018  . Murmur 11/01/2014  . Migraine without aura 09/14/2013  . Insomnia 09/14/2013    Social History   Tobacco Use  . Smoking status: Former Smoker     Quit date: 1997    Years since quitting: 25.1  . Smokeless tobacco: Never Used  Substance Use Topics  . Alcohol use: Yes    Comment: social    Current Outpatient Medications:  .  amoxicillin-clavulanate (AUGMENTIN) 875-125 MG tablet, Take 1 tablet by mouth 2 (two) times daily., Disp: 20 tablet, Rfl: 0 .  buPROPion (WELLBUTRIN XL) 150 MG 24 hr tablet, Take 1 tablet (150 mg total) by mouth daily., Disp: 90 tablet, Rfl: 1 .  clonazePAM (KLONOPIN) 0.5 MG tablet, 1/2 tab during the day and 1-2 tabs QHS and, Disp: 75 tablet, Rfl: 5 .  escitalopram (LEXAPRO) 20 MG tablet, Take 1 tablet (20 mg total) by mouth at bedtime., Disp: 90 tablet, Rfl: 1 .  predniSONE (DELTASONE) 50 MG tablet, Take 1 tablet (50 mg total) by mouth daily with breakfast., Disp: 5 tablet, Rfl: 0 .  celecoxib (CELEBREX) 200 MG capsule, Take 200 mg by mouth 2 (two) times daily., Disp: , Rfl:  .  gabapentin (NEURONTIN) 100 MG capsule, Take 100 mg by mouth at bedtime., Disp: , Rfl:   Allergies  Allergen Reactions  . Ambien [Zolpidem]     OBJECTIVE: There were no vitals taken for this visit. Gen: No acute distress. Nontoxic in appearance.  HENT: AT. [place].  MMM.  Eyes:Pupils Equal Round Reactive to light, Extraocular movements intact,  Conjunctiva without redness, discharge or icterus. Chest: Cough present- deep barking dry cough.  Skin: no rashes, purpura or petechiae.  Neuro:  . Alert. Oriented x3  Psych: Normal affect and demeanor. Normal speech.  Normal thought content and judgment.  ASSESSMENT AND PLAN: [name] is a 55 y.o. female present for  Cough/Acute nonintractable headache, unspecified headache type/Bronchitis with asthma, acute Rest, hydrate.  +/- flonase, mucinex (DM if cough), nettie pot or nasal saline.  Prednisone and augmentin  prescribed, take until completed.  If cough present it can last up to 6-8 weeks.  F/U 2 weeks if not improved.   - Novel Coronavirus, NAA ([facility])> drive up  testing arranged.     Lindsay Pouch, DO 06/26/2020   Return if symptoms worsen or fail to improve.  Orders Placed This Encounter  Procedures  . Novel Coronavirus, NAA ([facility])   Meds ordered this encounter  Medications  . predniSONE (DELTASONE) 50 MG tablet    Sig: Take 1 tablet (50 mg total) by mouth daily with breakfast.    Dispense:  5 tablet    Refill:  0  . amoxicillin-clavulanate (AUGMENTIN) 875-125 MG tablet    Sig: Take 1 tablet by mouth 2 (two) times daily.    Dispense:  20 tablet    Refill:  0   Referral Orders  No referral(s) requested today

## 2020-06-27 DIAGNOSIS — R059 Cough, unspecified: Secondary | ICD-10-CM | POA: Diagnosis not present

## 2020-06-28 LAB — NOVEL CORONAVIRUS, NAA: SARS-CoV-2, NAA: NOT DETECTED

## 2020-06-28 LAB — SARS-COV-2, NAA 2 DAY TAT

## 2020-06-29 ENCOUNTER — Telehealth: Payer: Self-pay | Admitting: Family Medicine

## 2020-06-29 NOTE — Telephone Encounter (Signed)
Please inform patient her Covid test is negative

## 2020-06-29 NOTE — Telephone Encounter (Signed)
Spoke with pt regarding labs and instructions.   

## 2020-07-13 ENCOUNTER — Encounter: Payer: Federal, State, Local not specified - PPO | Admitting: Family Medicine

## 2020-07-18 ENCOUNTER — Encounter: Payer: Federal, State, Local not specified - PPO | Admitting: Family Medicine

## 2020-08-08 ENCOUNTER — Encounter: Payer: Self-pay | Admitting: Family Medicine

## 2020-08-08 ENCOUNTER — Other Ambulatory Visit: Payer: Self-pay

## 2020-08-08 ENCOUNTER — Ambulatory Visit (INDEPENDENT_AMBULATORY_CARE_PROVIDER_SITE_OTHER): Payer: Federal, State, Local not specified - PPO | Admitting: Family Medicine

## 2020-08-08 VITALS — BP 111/72 | HR 79 | Temp 98.1°F | Ht 65.0 in | Wt 215.0 lb

## 2020-08-08 DIAGNOSIS — R7303 Prediabetes: Secondary | ICD-10-CM | POA: Diagnosis not present

## 2020-08-08 DIAGNOSIS — Z1159 Encounter for screening for other viral diseases: Secondary | ICD-10-CM | POA: Diagnosis not present

## 2020-08-08 DIAGNOSIS — G4700 Insomnia, unspecified: Secondary | ICD-10-CM | POA: Diagnosis not present

## 2020-08-08 DIAGNOSIS — E669 Obesity, unspecified: Secondary | ICD-10-CM

## 2020-08-08 DIAGNOSIS — E782 Mixed hyperlipidemia: Secondary | ICD-10-CM

## 2020-08-08 DIAGNOSIS — Z0000 Encounter for general adult medical examination without abnormal findings: Secondary | ICD-10-CM

## 2020-08-08 DIAGNOSIS — F418 Other specified anxiety disorders: Secondary | ICD-10-CM | POA: Diagnosis not present

## 2020-08-08 DIAGNOSIS — Z1231 Encounter for screening mammogram for malignant neoplasm of breast: Secondary | ICD-10-CM

## 2020-08-08 DIAGNOSIS — D126 Benign neoplasm of colon, unspecified: Secondary | ICD-10-CM

## 2020-08-08 LAB — COMPREHENSIVE METABOLIC PANEL
ALT: 25 U/L (ref 0–35)
AST: 21 U/L (ref 0–37)
Albumin: 4.2 g/dL (ref 3.5–5.2)
Alkaline Phosphatase: 65 U/L (ref 39–117)
BUN: 14 mg/dL (ref 6–23)
CO2: 27 mEq/L (ref 19–32)
Calcium: 9.4 mg/dL (ref 8.4–10.5)
Chloride: 104 mEq/L (ref 96–112)
Creatinine, Ser: 0.71 mg/dL (ref 0.40–1.20)
GFR: 96.51 mL/min (ref >60.00)
Glucose, Bld: 98 mg/dL (ref 70–99)
Potassium: 4.3 mEq/L (ref 3.5–5.1)
Sodium: 137 mEq/L (ref 135–145)
Total Bilirubin: 0.5 mg/dL (ref 0.2–1.2)
Total Protein: 6.8 g/dL (ref 6.0–8.3)

## 2020-08-08 LAB — CBC WITH DIFFERENTIAL/PLATELET
Basophils Absolute: 0 10*3/uL (ref 0.0–0.1)
Basophils Relative: 0.5 % (ref 0.0–3.0)
Eosinophils Absolute: 0.1 10*3/uL (ref 0.0–0.7)
Eosinophils Relative: 1.1 % (ref 0.0–5.0)
HCT: 39.8 % (ref 36.0–46.0)
Hemoglobin: 12.9 g/dL (ref 12.0–15.0)
Lymphocytes Relative: 32.9 % (ref 12.0–46.0)
Lymphs Abs: 2.2 10*3/uL (ref 0.7–4.0)
MCHC: 32.4 g/dL (ref 30.0–36.0)
MCV: 87.4 fl (ref 78.0–100.0)
Monocytes Absolute: 0.4 10*3/uL (ref 0.1–1.0)
Monocytes Relative: 5.3 % (ref 3.0–12.0)
Neutro Abs: 4 10*3/uL (ref 1.4–7.7)
Neutrophils Relative %: 60.2 % (ref 43.0–77.0)
Platelets: 241 10*3/uL (ref 150.0–400.0)
RBC: 4.55 Mil/uL (ref 3.87–5.11)
RDW: 14.1 % (ref 11.5–15.5)
WBC: 6.6 10*3/uL (ref 4.0–10.5)

## 2020-08-08 LAB — LIPID PANEL
Cholesterol: 202 mg/dL — ABNORMAL HIGH (ref 0–200)
HDL: 44.5 mg/dL (ref >39.00)
LDL Cholesterol: 133 mg/dL — ABNORMAL HIGH (ref 0–99)
NonHDL: 157.16
Total CHOL/HDL Ratio: 5
Triglycerides: 121 mg/dL (ref 0.0–149.0)
VLDL: 24.2 mg/dL (ref 0.0–40.0)

## 2020-08-08 LAB — HEMOGLOBIN A1C: Hgb A1c MFr Bld: 6.3 % (ref 4.6–6.5)

## 2020-08-08 LAB — TSH: TSH: 0.72 u[IU]/mL (ref 0.35–4.50)

## 2020-08-08 MED ORDER — BUPROPION HCL ER (XL) 150 MG PO TB24: 150.0000 mg | ORAL_TABLET | Freq: Every day | ORAL | 1 refills | Status: DC

## 2020-08-08 MED ORDER — ESCITALOPRAM OXALATE 20 MG PO TABS: 20.0000 mg | ORAL_TABLET | Freq: Every day | ORAL | 1 refills | Status: DC

## 2020-08-08 MED ORDER — CLONAZEPAM 0.5 MG PO TABS: ORAL_TABLET | ORAL | 5 refills | Status: DC

## 2020-08-08 NOTE — Progress Notes (Signed)
This visit occurred during the SARS-CoV-2 public health emergency.  Safety protocols were in place, including screening questions prior to the visit, additional usage of staff PPE, and extensive cleaning of exam room while observing appropriate contact time as indicated for disinfecting solutions.    Patient ID: Lindsay Hamilton, female  DOB: 06/22/1965, 55 y.o.   MRN: [id number] Patient Care Team    Relationship Specialty Notifications Start End  [name], DO PCP - General Family Medicine  04/15/19   Sharia Reeve, MD Referring Physician Psychiatry  09/14/13   Garnet Sierras, DO Consulting Physician Allergy  03/17/19   Clarene Essex, MD Consulting Physician Gastroenterology  04/15/19   Vickey Huger, MD Consulting Physician Orthopedic Surgery  04/15/19   Donia Ast, [place]  Orthopedic Surgery  04/15/19   Roel Cluck, MD Referring Physician Ophthalmology  04/15/19   Lelon Perla, MD Consulting Physician Cardiology  04/15/19   Kathrynn Ducking, MD Consulting Physician Neurology  04/15/19   Arvella Nigh, MD Consulting Physician Obstetrics and Gynecology  04/15/19   Juluis Rainier  Optometry  04/15/19   Clabe Seal, DMD  Orthodontics  04/15/19     Chief Complaint  Patient presents with  . Annual Exam    Pt is fasting     Subjective: Lindsay Hamilton is a 55 y.o.  Female  present for CPE/CMC. All past medical history, surgical history, allergies, family history, immunizations, medications and social history were updated in the electronic medical record today. All recent labs, ED visits and hospitalizations within the last year were reviewed.  Health maintenance:  Colonoscopy: completed 09/2019, by Dr. Watt Climes, follow up 5 years. Mammogram: completed: 04/2020 [facility] [place] Cervical cancer screening: Hysterectomy Immunizations: tdap UTD 2014, Influenza UTD 2021 (encouraged yearly), Shingrix series completed,  Covid x3 Infectious disease screening: HIV  completed, agreeable to Hep C screening.  DEXA: Routine screening Assistive device: none Oxygen JKD:TOIZ Patient has a Dental home. Hospitalizations/ED visits: reviewed  Insomnia, unspecified type/Depression with anxiety Patient states med regimen is working good.  She reports compliance  with Wellbutrin 150 mg daily and Lexapro 20 mg daily. compliant  with klonopin 0.5 mg 1/2 tab in the day and 1-2 tabs qhs.  Her daughter is getting married this May 21st. Prior note: Patient reports overall she is doing well since the changes in medication. She is sleeping well with using the Klonopin 0.5 mg nightly. She has not used the [place] and feels she is getting good sleep with the Klonopin. She does endorse having some increase in her emotional state, feeling more tearful at times since stopping the Lunesta and the Xanax. She has been taking the Xanax twice daily. However she has only been taking the Klonopin at night. It was written to allow her to have 1 Klonopin before bed and a half a tab during the day. She states she has not been taking the Klonopin during the day.   Depression screen [facility] 2/9 08/08/2020 01/27/2020 05/11/2019 04/15/2019  Decreased Interest - 0 3 0  Down, Depressed, Hopeless 1 0 3 0  PHQ - 2 Score 1 0 6 0  Altered sleeping 2 0 0 -  Tired, decreased energy 1 0 3 -  Change in appetite 1 0 0 -  Feeling bad or failure about yourself  0 0 0 -  Trouble concentrating 0 0 0 -  Moving slowly or fidgety/restless 1 0 0 -  Suicidal thoughts 0 0 0 -  PHQ-9 Score 6 0 9 -  Difficult doing work/chores - - Not difficult at all -   GAD 7 : Generalized Anxiety Score 08/08/2020 01/27/2020 05/11/2019  Nervous, Anxious, on Edge 2 0 3  Control/stop worrying 0 0 0  Worry too much - different things 1 0 0  Trouble relaxing 0 0 3  Restless - 0 0  Easily annoyed or irritable 1 3 3   Afraid - awful might happen 0 0 0  Total GAD 7 Score - 3 9  Anxiety Difficulty - Not difficult at all Not  difficult at all    Immunization History  Administered Date(s) Administered  . Influenza Split 04/08/2013, 02/17/2015, 02/16/2019  . Influenza-Unspecified 02/16/2019, 02/03/2020, 02/14/2020  . PFIZER(Purple Top)SARS-COV-2 Vaccination 05/17/2019, 06/07/2019, 01/27/2020  . Tdap 04/08/2013  . Zoster 07/18/2019  . Zoster Recombinat (Shingrix) 07/05/2019, 12/23/2019    Past Medical History:  Diagnosis Date  . Alcohol abuse, in remission   . Depression   . Head injury   . Headaches due to old head injury   . Ingrowing nail 02/20/2020  . Insomnia 09/14/2013  . Migraine without aura, without mention of intractable migraine without mention of status migrainosus 09/14/2013  . Obese   . Pancreatitis   . Pseudoseizures ([place])   . Right upper quadrant pain 06/26/2020  . Tubular adenoma of colon 2018   Allergies  Allergen Reactions  . Ambien [Zolpidem]    Past Surgical History:  Procedure Laterality Date  . ABDOMINAL HYSTERECTOMY  2009   Partial  . CARPAL TUNNEL RELEASE  1998  . CESAREAN SECTION     x2-1992, 1997  . CHOLECYSTECTOMY  2002  . COLONOSCOPY W/ BIOPSIES  05/2016   tubular adenoma  . GREAT TOE ARTHRODESIS, METATARSALPHALANGEAL JOINT Right 07/17/2018  . KNEE ARTHROSCOPY     bilateral, 3 right and 3 left  . SHOULDER ARTHROSCOPY Bilateral    Right 2012, left 2014   Family History  Problem Relation Age of Onset  . Hypertension Mother   . CAD Mother        Died of MI at age 57  . Asthma Mother   . Pulmonary embolism Mother        x2  . Cancer - Prostate Father   . Atrial fibrillation Father   . Hyperlipidemia Father   . Seizures Sister   . Migraines Sister   . Hypertension Brother   . Hypertension Brother   . Lung cancer Paternal Grandmother   . Breast cancer Neg Hx   . Allergic rhinitis Neg Hx   . Eczema Neg Hx   . Urticaria Neg Hx    Social History   Social History Narrative   Marital status/children/pets: Married.  2 children.   Education/employment:  High school graduate with some college.  Works as a Stage manager.   Safety:      -smoke alarm in the home:Yes     - wears seatbelt: Yes     - Feels safe in their relationships: Yes    Allergies as of 08/08/2020      Reactions   Ambien [zolpidem]       Medication List       Accurate as of August 08, 2020 12:46 PM. If you have any questions, ask your nurse or doctor.        STOP taking these medications   amoxicillin-clavulanate 875-125 MG tablet Commonly known as: AUGMENTIN Stopped by: Howard Pouch, DO   HYDROcodone-homatropine 5-1.5 MG/5ML syrup Commonly known as: HYCODAN  Stopped by: Howard Pouch, DO   predniSONE 50 MG tablet Commonly known as: DELTASONE Stopped by: Howard Pouch, DO     TAKE these medications   buPROPion 150 MG 24 hr tablet Commonly known as: WELLBUTRIN XL Take 1 tablet (150 mg total) by mouth daily.   celecoxib 200 MG capsule Commonly known as: CELEBREX Take 200 mg by mouth 2 (two) times daily.   clonazePAM 0.5 MG tablet Commonly known as: KLONOPIN 1/2 tab during the day and 1-2 tabs QHS and   escitalopram 20 MG tablet Commonly known as: LEXAPRO Take 1 tablet (20 mg total) by mouth at bedtime.   gabapentin 100 MG capsule Commonly known as: NEURONTIN Take 100 mg by mouth at bedtime.       All past medical history, surgical history, allergies, family history, immunizations andmedications were updated in the EMR today and reviewed under the history and medication portions of their EMR.      MM 3D SCREEN BREAST BILATERAL  Result Date: 04/20/2020 CLINICAL DATA:  Screening. EXAM: DIGITAL SCREENING BILATERAL MAMMOGRAM WITH TOMO AND CAD COMPARISON:  Previous exam(s). ACR Breast Density Category b: There are scattered areas of fibroglandular density. FINDINGS: There are no findings suspicious for malignancy. Images were processed with CAD. IMPRESSION: No mammographic evidence of malignancy. A result letter of this screening  mammogram will be mailed directly to the patient. RECOMMENDATION: Screening mammogram in one year. (Code:SM-B-01Y) BI-RADS CATEGORY  1: Negative. Electronically Signed   By: Franki Cabot M.D.   On: 04/20/2020 12:58     ROS: 14 pt review of systems performed and negative (unless mentioned in an HPI)  Objective: BP 111/72   Pulse 79   Temp 98.1 F (36.7 C) (Oral)   Ht 5\' 5"  (1.651 m)   Wt 215 lb (97.5 kg)   SpO2 98%   BMI 35.78 kg/m  Gen: Afebrile. No acute distress. Nontoxic in appearance, well-developed, well-nourished,  Pleasant obese female.  HENT: AT. [place]. Bilateral TM visualized and normal in appearance, normal external auditory canal. MMM, no oral lesions, adequate dentition. Bilateral nares within normal limits. Throat without erythema, ulcerations or exudates. no Cough on exam, no hoarseness on exam. Eyes:Pupils Equal Round Reactive to light, Extraocular movements intact,  Conjunctiva without redness, discharge or icterus. Neck/lymp/endocrine: Supple,no lymphadenopathy, no thyromegaly CV: RRR no murmur, no edema, +2/4 P posterior tibialis pulses.  Chest: CTAB, no wheeze, rhonchi or crackles. normal Respiratory effort. good Air movement. Abd: Soft. flat. NTND. BS present. no Masses palpated. No hepatosplenomegaly. No rebound tenderness or guarding. Skin: no rashes, purpura or petechiae. Warm and well-perfused. Skin intact. Neuro/Msk:  Normal gait. PERLA. EOMi. Alert. Oriented x3.  Cranial nerves II through XII intact. Muscle strength 5/5 upper/lower extremity. DTRs equal bilaterally. Psych: Normal affect, dress and demeanor. Normal speech. Normal thought content and judgment.   No exam data present  Assessment/plan: Lindsay Hamilton is a 54 y.o. female present for CPE [place] Insomnia, unspecified type/Depression with anxiety Stable Continue Wellbutrin 150 mg daily.   Continue Lexapro 20 mg daily. ContinueKlonopin with mild increase in night dose 0.5-1 mg nightly and use  half a tab 12 hours later if neededonly. DiscontinuedLunestaandXanax-04/15/2019. [place] controlled substance database reviewed04/06/22 controlled substance signed uds UTD Patient counseled on addictive properties of benzodiazepines. Follow-up 5-6 months, sooner if needed  Prediabetes Greatly improved last visit. - Hemoglobin A1c  Tubular adenoma of colon Colonoscopy up-to-date, repeat 2026  Mixed hyperlipidemia/obesity Diet and exercise recommended - CBC with Differential/Platelet - Comprehensive metabolic  panel - Lipid panel - TSH Need for hepatitis C screening test - Hepatitis C antibody Breast cancer screening: Mammogram ordered for December  Routine general medical examination at a health care facility Patient was encouraged to exercise greater than 150 minutes a week. Patient was encouraged to choose a diet filled with fresh fruits and vegetables, and lean meats. AVS provided to patient today for education/recommendation on gender specific health and safety maintenance. Colonoscopy: completed 09/2019, by Dr. Watt Climes, follow up 5 years. Mammogram: completed: 04/[facility] [place] Cervical cancer screening: Hysterectomy Immunizations: tdap UTD 2014, Influenza UTD 2021 (encouraged yearly), Shingrix series completed,  Covid x3 Infectious disease screening: HIV completed, agreeable to Hep C screening.  DEXA: Routine screening   Return in about 6 months (around 01/28/2021) for [place] (30 min) and 1 yr cpe.    Orders Placed This Encounter  Procedures  . MM 3D SCREEN BREAST BILATERAL  . CBC with Differential/Platelet  . Comprehensive metabolic panel  . Hemoglobin A1c  . Lipid panel  . TSH  . Hepatitis C antibody   Meds ordered this encounter  Medications  . escitalopram (LEXAPRO) 20 MG tablet    Sig: Take 1 tablet (20 mg total) by mouth at bedtime.    Dispense:  90 tablet    Refill:  1    Please DC other scripts for this med- provider change. Thanks !!  .  buPROPion (WELLBUTRIN XL) 150 MG 24 hr tablet    Sig: Take 1 tablet (150 mg total) by mouth daily.    Dispense:  90 tablet    Refill:  1  . clonazePAM (KLONOPIN) 0.5 MG tablet    Sig: 1/2 tab during the day and 1-2 tabs QHS and    Dispense:  75 tablet    Refill:  5   Referral Orders  No referral(s) requested today     Electronically signed by: Howard Pouch, [place]

## 2020-08-08 NOTE — Patient Instructions (Addendum)
Next appt in 5.5 mos.  Good luck on the wedding.     Health Maintenance, Female Adopting a healthy lifestyle and getting preventive care are important in promoting health and wellness. Ask your health care provider about:  The right schedule for you to have regular tests and exams.  Things you can do on your own to prevent diseases and keep yourself healthy. What should I know about diet, weight, and exercise? Eat a healthy diet  Eat a diet that includes plenty of vegetables, fruits, low-fat dairy products, and lean protein.  Do not eat a lot of foods that are high in solid fats, added sugars, or sodium.   Maintain a healthy weight Body mass index (BMI) is used to identify weight problems. It estimates body fat based on height and weight. Your health care provider can help determine your BMI and help you achieve or maintain a healthy weight. Get regular exercise Get regular exercise. This is one of the most important things you can do for your health. Most adults should:  Exercise for at least 150 minutes each week. The exercise should increase your heart rate and make you sweat (moderate-intensity exercise).  Do strengthening exercises at least twice a week. This is in addition to the moderate-intensity exercise.  Spend less time sitting. Even light physical activity can be beneficial. Watch cholesterol and blood lipids Have your blood tested for lipids and cholesterol at 54 years of age, then have this test every 5 years. Have your cholesterol levels checked more often if:  Your lipid or cholesterol levels are high.  You are older than 54 years of age.  You are at high risk for heart disease. What should I know about cancer screening? Depending on your health history and family history, you may need to have cancer screening at various ages. This may include screening for:  Breast cancer.  Cervical cancer.  Colorectal cancer.  Skin cancer.  Lung cancer. What should I  know about heart disease, diabetes, and high blood pressure? Blood pressure and heart disease  High blood pressure causes heart disease and increases the risk of stroke. This is more likely to develop in people who have high blood pressure readings, are of African descent, or are overweight.  Have your blood pressure checked: ? Every 3-5 years if you are 67-38 years of age. ? Every year if you are 89 years old or older. Diabetes Have regular diabetes screenings. This checks your fasting blood sugar level. Have the screening done:  Once every three years after age 51 if you are at a normal weight and have a low risk for diabetes.  More often and at a younger age if you are overweight or have a high risk for diabetes. What should I know about preventing infection? Hepatitis B If you have a higher risk for hepatitis B, you should be screened for this virus. Talk with your health care provider to find out if you are at risk for hepatitis B infection. Hepatitis C Testing is recommended for:  Everyone born from [birth year] through 1965.  Anyone with known risk factors for hepatitis C. Sexually transmitted infections (STIs)  Get screened for STIs, including gonorrhea and chlamydia, if: ? You are sexually active and are younger than 54 years of age. ? You are older than 54 years of age and your health care provider tells you that you are at risk for this type of infection. ? Your sexual activity has changed since you were last screened,  and you are at increased risk for chlamydia or gonorrhea. Ask your health care provider if you are at risk.  Ask your health care provider about whether you are at high risk for HIV. Your health care provider may recommend a prescription medicine to help prevent HIV infection. If you choose to take medicine to prevent HIV, you should first get tested for HIV. You should then be tested every 3 months for as long as you are taking the medicine. Pregnancy  If you are  about to stop having your period (premenopausal) and you may become pregnant, seek counseling before you get pregnant.  Take 400 to 800 micrograms (mcg) of folic acid every day if you become pregnant.  Ask for birth control (contraception) if you want to prevent pregnancy. Osteoporosis and menopause Osteoporosis is a disease in which the bones lose minerals and strength with aging. This can result in bone fractures. If you are 59 years old or older, or if you are at risk for osteoporosis and fractures, ask your health care provider if you should:  Be screened for bone loss.  Take a calcium or vitamin D supplement to lower your risk of fractures.  Be given hormone replacement therapy (HRT) to treat symptoms of menopause. Follow these instructions at home: Lifestyle  Do not use any products that contain nicotine or tobacco, such as cigarettes, e-cigarettes, and chewing tobacco. If you need help quitting, ask your health care provider.  Do not use street drugs.  Do not share needles.  Ask your health care provider for help if you need support or information about quitting drugs. Alcohol use  Do not drink alcohol if: ? Your health care provider tells you not to drink. ? You are pregnant, may be pregnant, or are planning to become pregnant.  If you drink alcohol: ? Limit how much you use to 0-1 drink a day. ? Limit intake if you are breastfeeding.  Be aware of how much alcohol is in your drink. In the [place] one drink equals one 12 oz bottle of beer (355 mL), one 5 oz glass of wine (148 mL), or one 1 oz glass of hard liquor (44 mL). General instructions  Schedule regular health, dental, and eye exams.  Stay current with your vaccines.  Tell your health care provider if: ? You often feel depressed. ? You have ever been abused or do not feel safe at home. Summary  Adopting a healthy lifestyle and getting preventive care are important in promoting health and wellness.  Follow  your health care provider's instructions about healthy diet, exercising, and getting tested or screened for diseases.  Follow your health care provider's instructions on monitoring your cholesterol and blood pressure. This information is not intended to replace advice given to you by your health care provider. Make sure you discuss any questions you have with your health care provider. Document Revised: 04/14/2018 Document Reviewed: 04/14/2018 Elsevier Patient Education  2021 [employer].

## 2020-08-09 LAB — HEPATITIS C ANTIBODY
Hepatitis C Ab: NONREACTIVE
SIGNAL TO CUT-OFF: 0.01 (ref <1.00)

## 2020-08-10 ENCOUNTER — Encounter: Payer: Self-pay | Admitting: Family Medicine

## 2020-08-10 ENCOUNTER — Other Ambulatory Visit: Payer: Self-pay

## 2020-08-10 ENCOUNTER — Ambulatory Visit: Payer: Federal, State, Local not specified - PPO | Admitting: Family Medicine

## 2020-08-10 VITALS — BP 115/79 | HR 113 | Temp 98.7°F | Ht 65.0 in | Wt 216.0 lb

## 2020-08-10 DIAGNOSIS — D1801 Hemangioma of skin and subcutaneous tissue: Secondary | ICD-10-CM | POA: Diagnosis not present

## 2020-08-10 DIAGNOSIS — L989 Disorder of the skin and subcutaneous tissue, unspecified: Secondary | ICD-10-CM

## 2020-08-10 NOTE — Patient Instructions (Signed)
Keep area clean and dry. Keep pressure dressing on area for 24 hours.   Wound Care, Adult Taking care of your wound properly can help to prevent pain, infection, and scarring. It can also help your wound heal more quickly. Follow instructions from your health care provider about how to care for your wound. Supplies needed:  Soap and water.  Wound cleanser.  Gauze.  If needed, a clean bandage (dressing) or other type of wound dressing material to cover or place in the wound. Follow your health care provider's instructions about what dressing supplies to use.  Cream or ointment to apply to the wound, if told by your health care provider. How to care for your wound Cleaning the wound Ask your health care provider how to clean the wound. This may include:  Using mild soap and water or a wound cleanser.  Using a clean gauze to pat the wound dry after cleaning it. Do not rub or scrub the wound. Dressing care  Wash your hands with soap and water for at least 20 seconds before and after you change the dressing. If soap and water are not available, use hand sanitizer.  Change your dressing as told by your health care provider. This may include: ? Cleaning or rinsing out (irrigating) the wound. ? Placing a dressing over the wound or in the wound (packing). ? Covering the wound with an outer dressing.  Leave any stitches (sutures), skin glue, or adhesive strips in place. These skin closures may need to stay in place for 2 weeks or longer. If adhesive strip edges start to loosen and curl up, you may trim the loose edges. Do not remove adhesive strips completely unless your health care provider tells you to do that.  Ask your health care provider when you can leave the wound uncovered. Checking for infection Check your wound area every day for signs of infection. Check for:  More redness, swelling, or pain.  Fluid or blood.  Warmth.  Pus or a bad smell.   Follow these  instructions at home Medicines  If you were prescribed an antibiotic medicine, cream, or ointment, take or apply it as told by your health care provider. Do not stop using the antibiotic even if your condition improves.  If you were prescribed pain medicine, take it 30 minutes before you do any wound care or as told by your health care provider.  Take over-the-counter and prescription medicines only as told by your health care provider. Eating and drinking  Eat a diet that includes protein, vitamin A, vitamin C, and other nutrient-rich foods to help the wound heal. ? Foods rich in protein include meat, fish, eggs, dairy, beans, and nuts. ? Foods rich in vitamin A include carrots and dark green, leafy vegetables. ? Foods rich in vitamin C include citrus fruits, tomatoes, broccoli, and peppers.  Drink enough fluid to keep your urine pale yellow. General instructions  Do not take baths, swim, use a hot tub, or do anything that would put the wound underwater until your health care provider approves. Ask your health care provider if you may take showers. You may only be allowed to take sponge baths.  Do not scratch or pick at the wound. Keep it covered as told by your health care provider.  Return to your normal activities as told by your health care provider. Ask your health care provider what activities are safe for you.  Protect your wound from the sun when you are outside  for the first 6 months, or for as long as told by your health care provider. Cover up the scar area or apply sunscreen that has an SPF of at least 59.  Do not use any products that contain nicotine or tobacco, such as cigarettes, e-cigarettes, and chewing tobacco. These may delay wound healing. If you need help quitting, ask your health care provider.  Keep all follow-up visits as told by your health care provider. This is important. Contact a health care provider if:  You received a tetanus shot and you have swelling,  severe pain, redness, or bleeding at the injection site.  Your pain is not controlled with medicine.  You have any of these signs of infection: ? More redness, swelling, or pain around the wound. ? Fluid or blood coming from the wound. ? Warmth coming from the wound. ? Pus or a bad smell coming from the wound. ? A fever or chills.  You are nauseous or you vomit.  You are dizzy. Get help right away if:  You have a red streak of skin near the area around your wound.  Your wound has been closed with staples, sutures, skin glue, or adhesive strips and it begins to open up and separate.  Your wound is bleeding, and the bleeding does not stop with gentle pressure.  You have a rash.  You faint.  You have trouble breathing. These symptoms may represent a serious problem that is an emergency. Do not wait to see if the symptoms will go away. Get medical help right away. Call your local emergency services (911 in the [place] Do not drive yourself to the hospital. Summary  Always wash your hands with soap and water for at least 20 seconds before and after changing your dressing.  Change your dressing as told by your health care provider.  To help with healing, eat foods that are rich in protein, vitamin A, vitamin C, and other nutrients.  Check your wound every day for signs of infection. Contact your health care provider if you suspect that your wound is infected. This information is not intended to replace advice given to you by your health care provider. Make sure you discuss any questions you have with your health care provider. Document Revised: 02/04/2019 Document Reviewed: 02/04/2019 Elsevier Patient Education  2021 [employer].

## 2020-08-10 NOTE — Progress Notes (Signed)
This visit occurred during the SARS-CoV-2 public health emergency.  Safety protocols were in place, including screening questions prior to the visit, additional usage of staff PPE, and extensive cleaning of exam room while observing appropriate contact time as indicated for disinfecting solutions.    Lindsay Hamilton , 09/27/1965, 55 y.o., female MRN: [id number] Patient Care Team    Relationship Specialty Notifications Start End  [name], DO PCP - General Family Medicine  04/15/19   Sharia Reeve, MD Referring Physician Psychiatry  09/14/13   Garnet Sierras, DO Consulting Physician Allergy  03/17/19   Clarene Essex, MD Consulting Physician Gastroenterology  04/15/19   Vickey Huger, MD Consulting Physician Orthopedic Surgery  04/15/19   Donia Ast, [place]  Orthopedic Surgery  04/15/19   Roel Cluck, MD Referring Physician Ophthalmology  04/15/19   Lelon Perla, MD Consulting Physician Cardiology  04/15/19   Kathrynn Ducking, MD Consulting Physician Neurology  04/15/19   Arvella Nigh, MD Consulting Physician Obstetrics and Gynecology  04/15/19   Juluis Rainier  Optometry  04/15/19   Clabe Seal, DMD  Orthodontics  04/15/19     Chief Complaint  Patient presents with  . Procedure     Subjective: Pt presents for an OV with complaints of raised skin lesion of back that has become irritated and bleeds. No personal or fhx of skin cancers.She would like to have area removed today.  Depression screen [facility] 2/9 08/08/2020 01/27/2020 05/11/2019 04/15/2019  Decreased Interest - 0 3 0  Down, Depressed, Hopeless 1 0 3 0  PHQ - 2 Score 1 0 6 0  Altered sleeping 2 0 0 -  Tired, decreased energy 1 0 3 -  Change in appetite 1 0 0 -  Feeling bad or failure about yourself  0 0 0 -  Trouble concentrating 0 0 0 -  Moving slowly or fidgety/restless 1 0 0 -  Suicidal thoughts 0 0 0 -  PHQ-9 Score 6 0 9 -  Difficult doing work/chores - - Not difficult at all -     Allergies  Allergen Reactions  . Ambien [Zolpidem]    Social History   Social History Narrative   Marital status/children/pets: Married.  2 children.   Education/employment: High school graduate with some college.  Works as a Stage manager.   Safety:      -smoke alarm in the home:Yes     - wears seatbelt: Yes     - Feels safe in their relationships: Yes   Past Medical History:  Diagnosis Date  . Alcohol abuse, in remission   . Depression   . Head injury   . Headaches due to old head injury   . Ingrowing nail 02/20/2020  . Insomnia 09/14/2013  . Migraine without aura, without mention of intractable migraine without mention of status migrainosus 09/14/2013  . Obese   . Pancreatitis   . Pseudoseizures ([place])   . Right upper quadrant pain 06/26/2020  . Tubular adenoma of colon 2018   Past Surgical History:  Procedure Laterality Date  . ABDOMINAL HYSTERECTOMY  2009   Partial  . CARPAL TUNNEL RELEASE  1998  . CESAREAN SECTION     x2-1992, 1997  . CHOLECYSTECTOMY  2002  . COLONOSCOPY W/ BIOPSIES  05/2016   tubular adenoma  . GREAT TOE ARTHRODESIS, METATARSALPHALANGEAL JOINT Right 07/17/2018  . KNEE ARTHROSCOPY     bilateral, 3 right and 3 left  . SHOULDER ARTHROSCOPY  Bilateral    Right 2012, left 2014   Family History  Problem Relation Age of Onset  . Hypertension Mother   . CAD Mother        Died of MI at age 44  . Asthma Mother   . Pulmonary embolism Mother        x2  . Cancer - Prostate Father   . Atrial fibrillation Father   . Hyperlipidemia Father   . Seizures Sister   . Migraines Sister   . Hypertension Brother   . Hypertension Brother   . Lung cancer Paternal Grandmother   . Breast cancer Neg Hx   . Allergic rhinitis Neg Hx   . Eczema Neg Hx   . Urticaria Neg Hx    Allergies as of 08/10/2020      Reactions   Ambien [zolpidem]       Medication List       Accurate as of August 10, 2020  3:37 PM. If you have any questions, ask  your nurse or doctor.        buPROPion 150 MG 24 hr tablet Commonly known as: WELLBUTRIN XL Take 1 tablet (150 mg total) by mouth daily.   celecoxib 200 MG capsule Commonly known as: CELEBREX Take 200 mg by mouth 2 (two) times daily.   clonazePAM 0.5 MG tablet Commonly known as: KLONOPIN 1/2 tab during the day and 1-2 tabs QHS and   escitalopram 20 MG tablet Commonly known as: LEXAPRO Take 1 tablet (20 mg total) by mouth at bedtime.   gabapentin 100 MG capsule Commonly known as: NEURONTIN Take 100 mg by mouth at bedtime.       All past medical history, surgical history, allergies, family history, immunizations andmedications were updated in the EMR today and reviewed under the history and medication portions of their EMR.     ROS: Negative, with the exception of above mentioned in HPI   Objective:  BP 115/79   Pulse (!) 113   Temp 98.7 F (37.1 C) (Oral)   Ht 5\' 5"  (1.651 m)   Wt 216 lb (98 kg)   SpO2 98%   BMI 35.94 kg/m  Body mass index is 35.94 kg/m. Gen: Afebrile. No acute distress. Nontoxic in appearance, well developed, well nourished.  HENT: AT. [place]. Eyes:Pupils Equal Round Reactive to light, Extraocular movements intact,  Conjunctiva without redness, discharge or icterus. Skin: raised flesh toned ~47mm raised skin lesion. Mildly excoriated.  Neuro: Alert. Oriented x3   No exam data present No results found. No results found for this or any previous visit (from the past 24 hour(s)).  Assessment/Plan: Lindsay Hamilton is a 55 y.o. female present for OV for irritated skin lesion left upper back The shave  biopsy was performed on the left upper back. The patient was consented for biopsy. The complications, instructions as to how the procedure will be performed, and postoperative instructions were given to the patient.  Intent: To remove a portion of skin so that it can be examined histologically. Location: left upper back PROCEDURE: The site was cleaned  with antiseptic. 0.25 cc of  local anesthetic (1%  lidocaine w/ epi) injected surrounding site. A shave  biopsy was performed with careful attention to obtain both affected tissue and normal tissue .Marland Kitchen The site was then checked for bleeding. Once hemostasis was achieved, a local antibiotic ointment was placed and a pressure dressing applied. The patient was further instructed to keep the site completely dry for the next  24 hours, after which a new dressing and antibiotic ointment should be applied to the area. They were further instructed to avoid getting the site dirty or infected. The patient completed the procedure without any complications and tolerated the the procedure well.  The biopsy will  be sent for analysis.> Derm path   Reviewed expectations re: course of current medical issues.  Discussed self-management of symptoms.  Outlined signs and symptoms indicating need for more acute intervention.  Patient verbalized understanding and all questions were answered.  Patient received an After-Visit Summary.    No orders of the defined types were placed in this encounter.  No orders of the defined types were placed in this encounter.  Referral Orders  No referral(s) requested today     Note is dictated utilizing voice recognition software. Although note has been proof read prior to signing, occasional typographical errors still can be missed. If any questions arise, please do not hesitate to call for verification.   electronically signed by:  Howard Pouch, DO  [place]

## 2020-09-10 DIAGNOSIS — M1812 Unilateral primary osteoarthritis of first carpometacarpal joint, left hand: Secondary | ICD-10-CM | POA: Diagnosis not present

## 2020-09-12 DIAGNOSIS — R1011 Right upper quadrant pain: Secondary | ICD-10-CM | POA: Diagnosis not present

## 2020-09-14 ENCOUNTER — Other Ambulatory Visit: Payer: Self-pay | Admitting: General Surgery

## 2020-09-14 DIAGNOSIS — R1011 Right upper quadrant pain: Secondary | ICD-10-CM

## 2020-09-28 ENCOUNTER — Ambulatory Visit
Admission: RE | Admit: 2020-09-28 | Discharge: 2020-09-28 | Disposition: A | Discharge status: Home / Self Care | Payer: Federal, State, Local not specified - PPO | Source: Ambulatory Visit | Attending: General Surgery | Admitting: General Surgery

## 2020-09-28 ENCOUNTER — Other Ambulatory Visit: Payer: Self-pay

## 2020-09-28 DIAGNOSIS — K449 Diaphragmatic hernia without obstruction or gangrene: Secondary | ICD-10-CM | POA: Diagnosis not present

## 2020-09-28 DIAGNOSIS — R1011 Right upper quadrant pain: Secondary | ICD-10-CM

## 2020-09-28 DIAGNOSIS — R109 Unspecified abdominal pain: Secondary | ICD-10-CM | POA: Diagnosis not present

## 2020-10-16 ENCOUNTER — Encounter: Payer: Self-pay | Admitting: Gastroenterology

## 2020-10-18 DIAGNOSIS — G5603 Carpal tunnel syndrome, bilateral upper limbs: Secondary | ICD-10-CM | POA: Diagnosis not present

## 2020-10-23 DIAGNOSIS — M1812 Unilateral primary osteoarthritis of first carpometacarpal joint, left hand: Secondary | ICD-10-CM | POA: Diagnosis not present

## 2020-10-23 DIAGNOSIS — G5602 Carpal tunnel syndrome, left upper limb: Secondary | ICD-10-CM | POA: Diagnosis not present

## 2020-11-20 DIAGNOSIS — G5602 Carpal tunnel syndrome, left upper limb: Secondary | ICD-10-CM | POA: Diagnosis not present

## 2020-11-20 DIAGNOSIS — M1812 Unilateral primary osteoarthritis of first carpometacarpal joint, left hand: Secondary | ICD-10-CM | POA: Diagnosis not present

## 2020-11-21 ENCOUNTER — Ambulatory Visit: Payer: Federal, State, Local not specified - PPO | Admitting: Gastroenterology

## 2020-11-21 ENCOUNTER — Encounter: Payer: Self-pay | Admitting: Gastroenterology

## 2020-11-21 DIAGNOSIS — R1310 Dysphagia, unspecified: Secondary | ICD-10-CM | POA: Diagnosis not present

## 2020-11-21 DIAGNOSIS — K219 Gastro-esophageal reflux disease without esophagitis: Secondary | ICD-10-CM | POA: Diagnosis not present

## 2020-11-21 MED ORDER — LINACLOTIDE 72 MCG PO CAPS: 72.0000 ug | ORAL_CAPSULE | Freq: Every day | ORAL | 6 refills | Status: DC

## 2020-11-21 MED ORDER — OMEPRAZOLE 40 MG PO CPDR
40.0000 mg | DELAYED_RELEASE_CAPSULE | Freq: Every day | ORAL | 11 refills | Status: DC
Start: 2020-11-21 — End: 2021-11-27

## 2020-11-21 NOTE — Progress Notes (Signed)
HPI: This is a very pleasant 55 year old woman who was referred to me by Ralene Ok, MD from [facility]  C she has had chronic constipation for all of her life.  She has tried Dulcolax without much help.  She is currently on probiotics and activity without much improvement.  She has tried MiraLAX for several weeks and Metamucil for several weeks and none of those helped.  She will move her bowels about once weekly with a lot of pushing and straining.  She does not see blood in her stool.  She had a colonoscopy about a year ago by her long-term gastroenterologist, see below.  She has chronic GERD.  This is despite 20 mg of omeprazole every morning shortly after she wakes.  She has heartburn and intermittent dysphagia.  She has had dysphagia to solid food and liquids for at least 2 to 3 years.  She feels that things stick at the sternal notch.  She had an abnormal barium esophagram last year.  See below.  She also thinks that she had an upper endoscopy with Dr. May God sometimes the past 2 or 3 years and he told her it was normal.  She had a hiatal hernia repair 2021.  She has intentionally lost weight by swimming a lot recently.  Old Data Reviewed: Ct scan abd/pelvis TIMES THREE 2019, 2020, 2022 for abd pains have all been pretty unrevealing.  Colonoscopy Dr. Watt Climes 09/2019 for "personal h/o polyps and RLQ pain" was completely normal.  Labs 08/2020 cbc, cmet normal.  Gastric emptying scan 10/2019 normal.  Esophagram 10/2019 Small hiatal hernia.  Moderate gastroesophageal reflux. Benign stricture of the GE junction which did not allow passage of a 13 mm barium tablet    Review of systems: Pertinent positive and negative review of systems were noted in the above HPI section. All other review negative.   Past Medical History:  Diagnosis Date   Alcohol abuse, in remission    Depression    Head injury    Headaches due to old head injury    Ingrowing nail 02/20/2020    Insomnia 09/14/2013   Migraine without aura, without mention of intractable migraine without mention of status migrainosus 09/14/2013   Obese    Pancreatitis    Pseudoseizures ([place])    Right upper quadrant pain 06/26/2020   Tubular adenoma of colon 2018    Past Surgical History:  Procedure Laterality Date   ABDOMINAL HYSTERECTOMY  2009   Partial   CARPAL TUNNEL RELEASE  1998   CESAREAN SECTION     x2-1992, 1997   CHOLECYSTECTOMY  2002   COLONOSCOPY W/ BIOPSIES  05/2016   tubular adenoma   GREAT TOE ARTHRODESIS, METATARSALPHALANGEAL JOINT Right 07/17/2018   KNEE ARTHROSCOPY     bilateral, 3 right and 3 left   SHOULDER ARTHROSCOPY Bilateral    Right 2012, left 2014    Current Outpatient Medications  Medication Sig Dispense Refill   buPROPion (WELLBUTRIN XL) 150 MG 24 hr tablet Take 1 tablet (150 mg total) by mouth daily. 90 tablet 1   celecoxib (CELEBREX) 200 MG capsule Take 200 mg by mouth 2 (two) times daily.     clonazePAM (KLONOPIN) 0.5 MG tablet 1/2 tab during the day and 1-2 tabs QHS and 75 tablet 5   escitalopram (LEXAPRO) 20 MG tablet Take 1 tablet (20 mg total) by mouth at bedtime. 90 tablet 1   gabapentin (NEURONTIN) 100 MG capsule Take 100 mg by mouth at bedtime.  No current facility-administered medications for this visit.    Allergies as of 11/21/2020 - Review Complete 11/21/2020  Allergen Reaction Noted   Ambien [zolpidem]  05/11/2019    Family History  Problem Relation Age of Onset   Hypertension Mother    CAD Mother        Died of MI at age 59   Asthma Mother    Pulmonary embolism Mother        x2   Cancer - Prostate Father    Atrial fibrillation Father    Hyperlipidemia Father    Seizures Sister    Migraines Sister    Hypertension Brother    Hypertension Brother    Lung cancer Paternal Grandmother    Breast cancer Neg Hx    Allergic rhinitis Neg Hx    Eczema Neg Hx    Urticaria Neg Hx     Social History   Socioeconomic History    Marital status: Married    Spouse name: Not on file   Number of children: 2   Years of education: college   Highest education level: Not on file  Occupational History    Employer: [employer]    Comment: [employer]  Tobacco Use   Smoking status: Former    Types: Cigarettes    Quit date: 1997    Years since quitting: 25.5   Smokeless tobacco: Never  Vaping Use   Vaping Use: Never used  Substance and Sexual Activity   Alcohol use: Yes    Comment: social   Drug use: No   Sexual activity: Yes    Partners: Male  Other Topics Concern   Not on file  Social History Narrative   Marital status/children/pets: Married.  2 children.   Education/employment: High school graduate with some college.  Works as a Stage manager.   Safety:      -smoke alarm in the home:Yes     - wears seatbelt: Yes     - Feels safe in their relationships: Yes   Social Determinants of Health   Financial Resource Strain: Not on file  Food Insecurity: Not on file  Transportation Needs: Not on file  Physical Activity: Not on file  Stress: Not on file  Social Connections: Not on file  Intimate Partner Violence: Not on file     Physical Exam: BP (!) 142/68   Pulse 71   Ht 5\' 5"  (1.651 m)   Wt 204 lb (92.5 kg)   BMI 33.95 kg/m  Constitutional: generally well-appearing Psychiatric: alert and oriented x3 Eyes: extraocular movements intact Mouth: oral pharynx moist, no lesions Neck: supple no lymphadenopathy Cardiovascular: heart regular rate and rhythm Lungs: clear to auscultation bilaterally Abdomen: soft, nontender, nondistended, no obvious ascites, no peritoneal signs, normal bowel sounds Extremities: no lower extremity edema bilaterally Skin: no lesions on visible extremities   Assessment and plan: 55 y.o. female with chronic constipation, chronic GERD, intermittent dysphagia  First it sounds like she has battled constipation for much of her  life.  She is up-to-date on colon cancer screening with a colonoscopy just a year ago with a previous gastroenterologist.  I am getting her prescription for Linzess 72 mcg 1 pill once daily since many other more standard regimens have not helped.  Second she has pyrosis, heartburn despite 20 mg omeprazole once daily.  I am giving her prescription for omeprazole 40 mg once daily and she knows it is best to take shortly before her breakfast meal on  a daily basis.  Third, she has intermittent dysphagia and an abnormal barium esophagram and I recommended EGD at her soonest convenience to evaluate that further.  Hopefully increasing her antiacid medicines will help her dysphagia  Lastly we will try to get records from her previous gastroenterologist with regards to previous polyps and any of her endoscopic procedures that he performed.  Please see the "Patient Instructions" section for addition details about the plan.   Owens Loffler, MD [facility] 11/21/2020, 8:24 AM  Cc: Ralene Ok, MD [facility] surgery  Total time on date of encounter was 45  minutes (this included time spent preparing to see the patient reviewing records; obtaining and/or reviewing separately obtained history; performing a medically appropriate exam and/or evaluation; counseling and educating the patient and family if present; ordering medications, tests or procedures if applicable; and documenting clinical information in the health record).

## 2020-11-21 NOTE — Patient Instructions (Addendum)
If you are age 54 or younger, your body mass index should be between 19-25. Your Body mass index is 33.95 kg/m. If this is out of the aformentioned range listed, please consider follow up with your Primary Care Provider.  __________________________________________________________  The [facility] providers would like to encourage you to use [facility] to communicate with providers for non-urgent requests or questions.  Due to long hold times on the telephone, sending your provider a message by [facility] may be a faster and more efficient way to get a response.  Please allow 48 business hours for a response.  Please remember that this is for non-urgent requests.  __________________________________________________________  Dennis Bast have been scheduled for an endoscopy. Please follow written instructions given to you at your visit today. If you use inhalers (even only as needed), please bring them with you on the day of your procedure.  Due to recent changes in healthcare laws, you may see the results of your imaging and laboratory studies on MyChart before your provider has had a chance to review them.  We understand that in some cases there may be results that are confusing or concerning to you. Not all laboratory results come back in the same time frame and the provider may be waiting for multiple results in order to interpret others.  Please give [place] 48 hours in order for your provider to thoroughly review all the results before contacting the office for clarification of your results.   We have sent the following medications to your pharmacy for you to pick up at your convenience:  START: Linzess 72 mcg one pill daily START: Omeprazole 40mg  one capsule daily.  STOP: Probiotic.  We will attempt to obtain records from Dr Watt Climes.  Thank you for entrusting me with your care and choosing [facility].  Dr Ardis Hughs

## 2020-11-23 ENCOUNTER — Ambulatory Visit (AMBULATORY_SURGERY_CENTER): Payer: Federal, State, Local not specified - PPO | Admitting: Gastroenterology

## 2020-11-23 ENCOUNTER — Other Ambulatory Visit: Payer: Self-pay

## 2020-11-23 ENCOUNTER — Encounter: Payer: Self-pay | Admitting: Gastroenterology

## 2020-11-23 VITALS — BP 111/70 | HR 57 | Temp 97.7°F | Resp 22 | Ht 65.0 in | Wt 204.0 lb

## 2020-11-23 DIAGNOSIS — K219 Gastro-esophageal reflux disease without esophagitis: Secondary | ICD-10-CM

## 2020-11-23 DIAGNOSIS — K222 Esophageal obstruction: Secondary | ICD-10-CM | POA: Diagnosis not present

## 2020-11-23 DIAGNOSIS — R1310 Dysphagia, unspecified: Secondary | ICD-10-CM | POA: Diagnosis not present

## 2020-11-23 DIAGNOSIS — R12 Heartburn: Secondary | ICD-10-CM

## 2020-11-23 MED ORDER — SODIUM CHLORIDE 0.9 % IV SOLN: 500.0000 mL | Freq: Once | INTRAVENOUS | Status: DC

## 2020-11-23 NOTE — Op Note (Signed)
[place] Patient Name: Lindsay Hamilton Procedure Date: 11/23/2020 2:25 PM MRN: [id number] Endoscopist: Milus Banister , MD Age: 55 Referring MD:  Date of Birth: [birth year] Gender: Female Account #: [id number] Procedure:                Upper GI endoscopy Indications:              Dysphagia, Heartburn Medicines:                Monitored Anesthesia Care Procedure:                Pre-Anesthesia Assessment:                           - Prior to the procedure, a History and Physical                            was performed, and patient medications and                            allergies were reviewed. The patient's tolerance of                            previous anesthesia was also reviewed. The risks                            and benefits of the procedure and the sedation                            options and risks were discussed with the patient.                            All questions were answered, and informed consent                            was obtained. Prior Anticoagulants: The patient has                            taken no previous anticoagulant or antiplatelet                            agents. ASA Grade Assessment: II - A patient with                            mild systemic disease. After reviewing the risks                            and benefits, the patient was deemed in                            satisfactory condition to undergo the procedure.                           After obtaining informed consent, the endoscope was  passed under direct vision. Throughout the                            procedure, the patient's blood pressure, pulse, and                            oxygen saturations were monitored continuously. The                            Endoscope was introduced through the mouth, and                            advanced to the second part of duodenum. The upper                            GI endoscopy was accomplished  without difficulty.                            The patient tolerated the procedure well. Scope In: Scope Out: Findings:                 One benign-appearing, intrinsic mild stenosis was                            found at the gastroesophageal junction (thick                            Schatzki's ring vs. focal peptic stricture).                            Dilation with a 16-17-18 mm balloon dilator was                            performed to 18 mm with usual resultant supercial                            mucosal disruptions and self limited minor bleeding.                           The exam was otherwise without abnormality. Complications:            No immediate complications. Estimated blood loss:                            None. Estimated Blood Loss:     Estimated blood loss: none. Impression:               - Benign-appearing esophageal stricture (focal                            peptic stricture vs. thick Schatzki's ring).                            Dilated to 60m today.                           -  The examination was otherwise normal.                           - No specimens collected. Recommendation:           - Patient has a contact number available for                            emergencies. The signs and symptoms of potential                            delayed complications were discussed with the                            patient. Return to normal activities tomorrow.                            Written discharge instructions were provided to the                            patient.                           - Resume previous diet.                           - Continue present medications, especially the                            omeprazole '40mg'$  pill before breakfast meal every                            day.                           - Call Dr. Ardis Hughs office in 4-5 weeks to report on                            your swallowing. Milus Banister, MD 11/23/2020 2:51:12 PM This  report has been signed electronically.

## 2020-11-23 NOTE — Patient Instructions (Signed)
YOU HAD AN ENDOSCOPIC PROCEDURE TODAY AT THE [facility]:   Refer to the procedure report that was given to you for any specific questions about what was found during the examination.  If the procedure report does not answer your questions, please call your gastroenterologist to clarify.  If you requested that your care partner not be given the details of your procedure findings, then the procedure report has been included in a sealed envelope for you to review at your convenience later.  YOU SHOULD EXPECT: Some feelings of bloating in the abdomen. Passage of more gas than usual.  Walking can help get rid of the air that was put into your GI tract during the procedure and reduce the bloating. If you had a lower endoscopy (such as a colonoscopy or flexible sigmoidoscopy) you may notice spotting of blood in your stool or on the toilet paper. If you underwent a bowel prep for your procedure, you may not have a normal bowel movement for a few days.  Please Note:  You might notice some irritation and congestion in your nose or some drainage.  This is from the oxygen used during your procedure.  There is no need for concern and it should clear up in a day or so.  SYMPTOMS TO REPORT IMMEDIATELY:   Following lower endoscopy (colonoscopy or flexible sigmoidoscopy):  Excessive amounts of blood in the stool  Significant tenderness or worsening of abdominal pains  Swelling of the abdomen that is new, acute  Fever of 100F or higher   Following upper endoscopy (EGD)  Vomiting of blood or coffee ground material  New chest pain or pain under the shoulder blades  Painful or persistently difficult swallowing  New shortness of breath  Fever of 100F or higher  Black, tarry-looking stools  For urgent or emergent issues, a gastroenterologist can be reached at any hour by calling [phone]. Do not use MyChart messaging for urgent concerns.    DIET:  We do recommend a small meal at first, but  then you may proceed to your regular diet.  Drink plenty of fluids but you should avoid alcoholic beverages for 24 hours.  ACTIVITY:  You should plan to take it easy for the rest of today and you should NOT DRIVE or use heavy machinery until tomorrow (because of the sedation medicines used during the test).    FOLLOW UP: Our staff will call the number listed on your records 48-72 hours following your procedure to check on you and address any questions or concerns that you may have regarding the information given to you following your procedure. If we do not reach you, we will leave a message.  We will attempt to reach you two times.  During this call, we will ask if you have developed any symptoms of COVID 19. If you develop any symptoms (ie: fever, flu-like symptoms, shortness of breath, cough etc.) before then, please call [phone].  If you test positive for Covid 19 in the 2 weeks post procedure, please call and report this information to us.    If any biopsies were taken you will be contacted by phone or by letter within the next 1-3 weeks.  Please call us at [phone] if you have not heard about the biopsies in 3 weeks.    SIGNATURES/CONFIDENTIALITY: You and/or your care partner have signed paperwork which will be entered into your electronic medical record.  These signatures attest to the fact that that the information above on   your After Visit Summary has been reviewed and is understood.  Full responsibility of the confidentiality of this discharge information lies with you and/or your care-partner. 

## 2020-11-23 NOTE — Progress Notes (Signed)
Medical history reviewed with no changes noted. VS assessed by C.W 

## 2020-11-23 NOTE — Progress Notes (Signed)
Called to room to assist during endoscopic procedure.  Patient ID and intended procedure confirmed with present staff. Received instructions for my participation in the procedure from the performing physician.  

## 2020-11-23 NOTE — Progress Notes (Signed)
pt tolerated well. VSS. awake and to recovery. Report given to RN. Bite block left insitu to recovery. No trauma. 

## 2020-11-27 ENCOUNTER — Telehealth: Payer: Self-pay

## 2020-11-27 NOTE — Telephone Encounter (Signed)
LVM

## 2020-11-30 ENCOUNTER — Telehealth: Payer: Self-pay | Admitting: Gastroenterology

## 2020-11-30 NOTE — Telephone Encounter (Signed)
Inbound call from patient stating Linzess need PA.   Also wonder if not approved by today if she could get a sample to last at least a week because she is going out of town tomorrow, Saturday morning. Best contact number [phone]

## 2020-11-30 NOTE — Telephone Encounter (Signed)
Patient advised that prior authorization has been started for Linzess, but has been sent to review.  Samples of Linzess left at front desk for patient to pick up until a decision has been made.   Patient agreed to plan and verbalized understanding. No further questions.

## 2020-12-03 NOTE — Telephone Encounter (Signed)
Unfortunately the Linzess was declined.  Did the sample work for her?  Can instead trial Amitiza 24 mcg bid for chronic idiopathic constipation.

## 2020-12-03 NOTE — Telephone Encounter (Signed)
Linzess 72 mcg capsules was denied by [employer] plan.    Please advise another medication patient can try as you are DOD and Dr Ardis Hughs is away. Samples of Linzess was provided for patient last week.   Thank you, Elmyra Ricks

## 2020-12-04 NOTE — Telephone Encounter (Signed)
Reviewed fax from [facility] with their decision to to deny Linzess. Their decision was based on continuation of treatment without an improvement in constipation symptoms does not establish medical necessity for this drug.  Phone call was made to the patient for clarification on constipation symptoms.  Patient stated, "since taking Linzess I have had a bowel movement each day.  This has never happened to me in my life.  I have lived with constipation my entire life and I have been feeling great since starting medications."  Phone call made to [facility]) to make them aware that patient has seen improvement in constipation symptoms since starting Linzess by having a bowel movement daily.    Linzess was approved from 11-03-2020 to 12-04-2021 per [facility].  Left message on patient voicemail with this information.  Advised patient to return call with any further questions or concerns.

## 2020-12-17 ENCOUNTER — Telehealth: Payer: Self-pay | Admitting: Gastroenterology

## 2020-12-17 NOTE — Telephone Encounter (Signed)
The pt has been advised and recall entered in Epic

## 2020-12-17 NOTE — Telephone Encounter (Signed)
I reviewed a 20 page packet of information from her previous gastroenterologist, Dr. May God at [facility] gastro[facility].   Colonoscopy January 2018 indication "screening for colorectal malignant neoplasm."  Findings 3 subcentimeter polyps were found and removed.  One was a tubular adenoma, the other were not precancerous.  Colonoscopy May 2021, indication "personal history of colon polyps.  Abdominal pain right lower quadrant."  Findings: Terminal ileum were completely normal.  He recommended repeat colonoscopy in 5 years for "screening purposes"  EGD February 2021, indication "epigastric abdominal pain, abdominal pain in the right upper quadrant" findings mild gastritis.  Multiple biopsies taken from duodenum and stomach.  Biopsies were all essentially normal.     Can you let her know that I reviewed her previous records from [facility] gastroenterology.  She needs recall colonoscopy May 2031, which is 10 years from her last colonoscopy since it was normal and she has never had high risk adenomas.

## 2021-01-14 DIAGNOSIS — M1812 Unilateral primary osteoarthritis of first carpometacarpal joint, left hand: Secondary | ICD-10-CM | POA: Diagnosis not present

## 2021-01-14 DIAGNOSIS — M654 Radial styloid tenosynovitis [de Quervain]: Secondary | ICD-10-CM | POA: Diagnosis not present

## 2021-01-14 DIAGNOSIS — G5602 Carpal tunnel syndrome, left upper limb: Secondary | ICD-10-CM | POA: Diagnosis not present

## 2021-01-17 DIAGNOSIS — M79645 Pain in left finger(s): Secondary | ICD-10-CM | POA: Diagnosis not present

## 2021-01-17 DIAGNOSIS — Z9889 Other specified postprocedural states: Secondary | ICD-10-CM | POA: Diagnosis not present

## 2021-01-17 DIAGNOSIS — Z4789 Encounter for other orthopedic aftercare: Secondary | ICD-10-CM | POA: Diagnosis not present

## 2021-01-20 ENCOUNTER — Other Ambulatory Visit: Payer: Self-pay | Admitting: Family Medicine

## 2021-01-28 ENCOUNTER — Ambulatory Visit: Payer: Federal, State, Local not specified - PPO | Admitting: Family Medicine

## 2021-01-28 ENCOUNTER — Other Ambulatory Visit: Payer: Self-pay

## 2021-01-28 ENCOUNTER — Encounter: Payer: Self-pay | Admitting: Family Medicine

## 2021-01-28 VITALS — BP 118/75 | HR 66 | Temp 98.2°F | Ht 65.0 in | Wt 200.0 lb

## 2021-01-28 DIAGNOSIS — F5101 Primary insomnia: Secondary | ICD-10-CM | POA: Diagnosis not present

## 2021-01-28 DIAGNOSIS — F418 Other specified anxiety disorders: Secondary | ICD-10-CM | POA: Diagnosis not present

## 2021-01-28 MED ORDER — CLONAZEPAM 0.5 MG PO TABS: ORAL_TABLET | ORAL | 5 refills | Status: DC

## 2021-01-28 MED ORDER — BUPROPION HCL ER (XL) 150 MG PO TB24: 150.0000 mg | ORAL_TABLET | Freq: Every day | ORAL | 1 refills | Status: DC

## 2021-01-28 MED ORDER — ESCITALOPRAM OXALATE 20 MG PO TABS: 20.0000 mg | ORAL_TABLET | Freq: Every day | ORAL | 1 refills | Status: DC

## 2021-01-28 NOTE — Patient Instructions (Signed)
Great to see you today.  I have refilled the medication(s) we provide.   If labs were collected, we will inform you of lab results once received either by echart message or telephone call.   - echart message- for normal results that have been seen by the patient already.   - telephone call: abnormal results or if patient has not viewed results in their echart.  

## 2021-01-28 NOTE — Progress Notes (Signed)
This visit occurred during the SARS-CoV-2 public health emergency.  Safety protocols were in place, including screening questions prior to the visit, additional usage of staff PPE, and extensive cleaning of exam room while observing appropriate contact time as indicated for disinfecting solutions.    Patient ID: Lindsay Hamilton, female  DOB: 01/26/1966, 55 y.o.   MRN: [id number] Patient Care Team    Relationship Specialty Notifications Start End  [name], DO PCP - General Family Medicine  04/15/19   Sharia Reeve, MD Referring Physician Psychiatry  09/14/13   Garnet Sierras, DO Consulting Physician Allergy  03/17/19   Clarene Essex, MD Consulting Physician Gastroenterology  04/15/19   Vickey Huger, MD Consulting Physician Orthopedic Surgery  04/15/19   Donia Ast, [place]  Orthopedic Surgery  04/15/19   Roel Cluck, MD Referring Physician Ophthalmology  04/15/19   Lelon Perla, MD Consulting Physician Cardiology  04/15/19   Kathrynn Ducking, MD Consulting Physician Neurology  04/15/19   Arvella Nigh, MD Consulting Physician Obstetrics and Gynecology  04/15/19   Juluis Rainier  Optometry  04/15/19   Clabe Seal, DMD  Orthodontics  04/15/19     Chief Complaint  Patient presents with   Anxiety    Rudy; pt is fasting    Subjective: Lindsay Hamilton is a 55 y.o.  Female  present for [facility].   Insomnia, unspecified type/Depression with anxiety Patient states med regimen is working well for her. She reports compliance  with Wellbutrin 150 mg daily and Lexapro 20 mg daily. compliant  with klonopin 0.5 mg 1/2 tab in the day and 1-2 tabs qhs.  Her daughter was married this May 21 and her son just got engaged.  She has had recent surgery lefty cmc. She has some increased anxiety, but feels it is related to recent surgery and being home more.  Prior note: Patient reports overall she is doing well since the changes in medication.  She is sleeping well with  using the Klonopin 0.5 mg nightly.  She has not used the Superior and feels she is getting good sleep with the Klonopin.  She does endorse having some increase in her emotional state, feeling more tearful at times since stopping the Lunesta and the Xanax.  She has been taking the Xanax twice daily.  However she has only been taking the Klonopin at night.  It was written to allow her to have 1 Klonopin before bed and a half a tab during the day.  She states she has not been taking the Klonopin during the day.    Depression screen [facility] 2/9 01/28/2021 08/08/2020 01/27/2020 05/11/2019 04/15/2019  Decreased Interest 0 - 0 3 0  Down, Depressed, Hopeless 1 1 0 3 0  PHQ - 2 Score 1 1 0 6 0  Altered sleeping 1 2 0 0 -  Tired, decreased energy 1 1 0 3 -  Change in appetite 0 1 0 0 -  Feeling bad or failure about yourself  0 0 0 0 -  Trouble concentrating 0 0 0 0 -  Moving slowly or fidgety/restless 1 1 0 0 -  Suicidal thoughts 0 0 0 0 -  PHQ-9 Score 4 6 0 9 -  Difficult doing work/chores - - - Not difficult at all -   GAD 7 : Generalized Anxiety Score 01/28/2021 08/08/2020 01/27/2020 05/11/2019  Nervous, Anxious, on Edge 1 2 0 3  Control/stop worrying 0 0 0 0  Worry  too much - different things 1 1 0 0  Trouble relaxing 2 0 0 3  Restless 1 - 0 0  Easily annoyed or irritable 2 1 3 3   Afraid - awful might happen 0 0 0 0  Total GAD 7 Score 7 - 3 9  Anxiety Difficulty - - Not difficult at all Not difficult at all    Immunization History  Administered Date(s) Administered   Influenza Split 04/08/2013, 02/17/2015, 02/16/2019   Influenza,inj,Quad PF,6+ Mos 01/21/2021   Influenza-Unspecified 02/16/2019, 02/03/2020, 02/14/2020   PFIZER(Purple Top)SARS-COV-2 Vaccination 05/17/2019, 06/07/2019, 01/27/2020, 01/21/2021   Pfizer Covid-19 Vaccine Bivalent Booster 9yrs & up 01/21/2021   Tdap 04/08/2013   Zoster Recombinat (Shingrix) 07/05/2019, 12/23/2019   Zoster, Live 07/18/2019    Past Medical History:   Diagnosis Date   Alcohol abuse, in remission    Depression    Head injury    Headaches due to old head injury    Ingrowing nail 02/20/2020   Insomnia 09/14/2013   Migraine without aura, without mention of intractable migraine without mention of status migrainosus 09/14/2013   Obese    Pancreatitis    Pseudoseizures ([place])    Right upper quadrant pain 06/26/2020   Tubular adenoma of colon 2018   Allergies  Allergen Reactions   Ambien [Zolpidem]    Past Surgical History:  Procedure Laterality Date   ABDOMINAL HYSTERECTOMY  2009   Partial   CARPAL TUNNEL RELEASE  1998   CESAREAN SECTION     x2-1992, 1997   CHOLECYSTECTOMY  2002   COLONOSCOPY W/ BIOPSIES  05/2016   tubular adenoma   GREAT TOE ARTHRODESIS, METATARSALPHALANGEAL JOINT Right 07/17/2018   KNEE ARTHROSCOPY     bilateral, 3 right and 3 left   SHOULDER ARTHROSCOPY Bilateral    Right 2012, left 2014   Family History  Problem Relation Age of Onset   Hypertension Mother    CAD Mother        Died of MI at age 28   Asthma Mother    Pulmonary embolism Mother        x2   Cancer - Prostate Father    Atrial fibrillation Father    Hyperlipidemia Father    Seizures Sister    Migraines Sister    Hypertension Brother    Hypertension Brother    Lung cancer Paternal Grandmother    Breast cancer Neg Hx    Allergic rhinitis Neg Hx    Eczema Neg Hx    Urticaria Neg Hx    Social History   Social History Narrative   Marital status/children/pets: Married.  2 children.   Education/employment: High school graduate with some college.  Works as a Stage manager.   Safety:      -smoke alarm in the home:Yes     - wears seatbelt: Yes     - Feels safe in their relationships: Yes    Allergies as of 01/28/2021       Reactions   Ambien [zolpidem]         Medication List        Accurate as of January 28, 2021 12:16 PM. If you have any questions, ask your nurse or doctor.          buPROPion 150  MG 24 hr tablet Commonly known as: WELLBUTRIN XL Take 1 tablet (150 mg total) by mouth daily.   celecoxib 200 MG capsule Commonly known as: CELEBREX Take 200 mg by mouth 2 (two) times daily.  clonazePAM 0.5 MG tablet Commonly known as: KLONOPIN 1/2 tab during the day and 1-2 tabs QHS and   escitalopram 20 MG tablet Commonly known as: LEXAPRO Take 1 tablet (20 mg total) by mouth at bedtime.   gabapentin 100 MG capsule Commonly known as: NEURONTIN Take 100 mg by mouth at bedtime.   linaclotide 72 MCG capsule Commonly known as: Linzess Take 1 capsule (72 mcg total) by mouth daily before breakfast.   omeprazole 40 MG capsule Commonly known as: PRILOSEC Take 1 capsule (40 mg total) by mouth daily. What changed: Another medication with the same name was removed. Continue taking this medication, and follow the directions you see here. Changed by: Howard Pouch, DO   oxyCODONE-acetaminophen 5-325 MG tablet Commonly known as: PERCOCET/ROXICET Take 1 tablet by mouth every 6 (six) hours as needed.        All past medical history, surgical history, allergies, family history, immunizations andmedications were updated in the EMR today and reviewed under the history and medication portions of their EMR.       ROS: 14 pt review of systems performed and negative (unless mentioned in an HPI)  Objective: BP 118/75   Pulse 66   Temp 98.2 F (36.8 C) (Oral)   Ht 5\' 5"  (1.651 m)   Wt 200 lb (90.7 kg)   SpO2 98%   BMI 33.28 kg/m  Gen: Afebrile. No acute distress.  HENT: AT. .  Eyes:Pupils Equal Round Reactive to light, Extraocular movements intact,  Conjunctiva without redness, discharge or icterus. CV: RRR no edema. Chest: CTAB, no wheeze or crackles Neuro: Normal gait. PERLA. EOMi. Alert. Oriented x3 Psych: Normal affect, dress and demeanor. Normal speech. Normal thought content and judgment.   No results found.  Assessment/plan: Katalea Ucci is a 55 y.o. female  present for [facility] Insomnia, unspecified type/Depression with anxiety Stable Continue Wellbutrin 150 mg daily.   Continue Lexapro 20 mg daily. Continue Klonopin with mild increase in night dose 0.5-1 mg nightly and use half a tab 12 hours later if needed only.  Discontinued Lunesta and Xanax -04/15/2019. [place] controlled substance database reviewed 01/28/21 controlled substance signed uds UTD Patient counseled on addictive properties of benzodiazepines. Follow-up 5-6 months, sooner if needed    Return in about 23 weeks (around 07/08/2021) for CPE (30 min), [facility] (30 min) - if calendar yr cpe.    No orders of the defined types were placed in this encounter.  Meds ordered this encounter  Medications   buPROPion (WELLBUTRIN XL) 150 MG 24 hr tablet    Sig: Take 1 tablet (150 mg total) by mouth daily.    Dispense:  90 tablet    Refill:  1   escitalopram (LEXAPRO) 20 MG tablet    Sig: Take 1 tablet (20 mg total) by mouth at bedtime.    Dispense:  90 tablet    Refill:  1   clonazePAM (KLONOPIN) 0.5 MG tablet    Sig: 1/2 tab during the day and 1-2 tabs QHS and    Dispense:  75 tablet    Refill:  5    Referral Orders  No referral(s) requested today     Electronically signed by: Howard Pouch, [place]

## 2021-01-30 DIAGNOSIS — Z9889 Other specified postprocedural states: Secondary | ICD-10-CM | POA: Diagnosis not present

## 2021-01-30 DIAGNOSIS — M25642 Stiffness of left hand, not elsewhere classified: Secondary | ICD-10-CM | POA: Diagnosis not present

## 2021-01-30 DIAGNOSIS — M79645 Pain in left finger(s): Secondary | ICD-10-CM | POA: Diagnosis not present

## 2021-02-07 DIAGNOSIS — M79645 Pain in left finger(s): Secondary | ICD-10-CM | POA: Diagnosis not present

## 2021-03-04 ENCOUNTER — Encounter: Payer: Self-pay | Admitting: Family Medicine

## 2021-03-04 ENCOUNTER — Telehealth (INDEPENDENT_AMBULATORY_CARE_PROVIDER_SITE_OTHER): Payer: Federal, State, Local not specified - PPO | Admitting: Family Medicine

## 2021-03-04 VITALS — BP 127/81 | HR 75 | Temp 98.9°F

## 2021-03-04 DIAGNOSIS — J209 Acute bronchitis, unspecified: Secondary | ICD-10-CM | POA: Diagnosis not present

## 2021-03-04 DIAGNOSIS — B9689 Other specified bacterial agents as the cause of diseases classified elsewhere: Secondary | ICD-10-CM

## 2021-03-04 DIAGNOSIS — J019 Acute sinusitis, unspecified: Secondary | ICD-10-CM | POA: Diagnosis not present

## 2021-03-04 MED ORDER — AMOXICILLIN-POT CLAVULANATE 875-125 MG PO TABS: 1.0000 | ORAL_TABLET | Freq: Two times a day (BID) | ORAL | 0 refills | Status: DC

## 2021-03-04 MED ORDER — PREDNISONE 50 MG PO TABS: 50.0000 mg | ORAL_TABLET | Freq: Every day | ORAL | 0 refills | Status: DC

## 2021-03-04 MED ORDER — HYDROCODONE BIT-HOMATROP MBR 5-1.5 MG/5ML PO SOLN: 5.0000 mL | Freq: Three times a day (TID) | ORAL | 0 refills | Status: DC | PRN

## 2021-03-04 NOTE — Progress Notes (Signed)
VIRTUAL VISIT VIA VIDEO  I connected with Lindsay Hamilton on 03/04/21 at  2:15 PM EDT by elemedicine application and verified that I am speaking with the correct person using two identifiers. Location patient: Home Location provider: [facility], Office Persons participating in the virtual visit: Patient, Dr. Raoul Pitch and Darnell Level. Cesar, CMA  I discussed the limitations of evaluation and management by telemedicine and the availability of in person appointments. The patient expressed understanding and agreed to proceed.   SUBJECTIVE Chief Complaint  Patient presents with   Cough    Pt c/o productive cough, sore throat, nasal congestion, nasal drainage x 10 days; ned covid test today    HPI: Lindsay Hamilton is a 55 y.o. female present for acute illness of nasal congestion , productive cough , sore throat and nasal drainage of > 10 days. Negative covid test.  She reports extreme fatigue and cough is harsh and worsening.  She denies any current fever or chills.  sHe is tolerating p.o. OTC: deslym, benadryl, mucinex.   ROS: See pertinent positives and negatives per HPI.  Patient Active Problem List   Diagnosis Date Noted   Irritable bowel syndrome 06/26/2020   Hyperlipidemia 06/26/2020   Chronic pain of left knee 06/19/2020   Plantar fasciitis of right foot 08/08/2019   Laryngopharyngeal reflux (LPR) 08/02/2019   Reactive airway disease 07/28/2019   Encounter for preventive health examination 07/05/2019   Obesity (BMI 30-39.9) 07/05/2019   Prediabetes 04/15/2019   Depression with anxiety 04/15/2019   Chronic rhinitis 03/17/2019   Tubular adenoma of colon 2018   Murmur 11/01/2014   Migraine without aura 09/14/2013   Insomnia 09/14/2013    Social History   Tobacco Use   Smoking status: Former    Types: Cigarettes    Quit date: 1997    Years since quitting: 25.8   Smokeless tobacco: Never  Substance Use Topics   Alcohol use: Yes    Comment: social    Current  Outpatient Medications:    amoxicillin-clavulanate (AUGMENTIN) 875-125 MG tablet, Take 1 tablet by mouth 2 (two) times daily., Disp: 20 tablet, Rfl: 0   buPROPion (WELLBUTRIN XL) 150 MG 24 hr tablet, Take 1 tablet (150 mg total) by mouth daily., Disp: 90 tablet, Rfl: 1   celecoxib (CELEBREX) 200 MG capsule, Take 200 mg by mouth 2 (two) times daily., Disp: , Rfl:    clonazePAM (KLONOPIN) 0.5 MG tablet, 1/2 tab during the day and 1-2 tabs QHS and, Disp: 75 tablet, Rfl: 5   escitalopram (LEXAPRO) 20 MG tablet, Take 1 tablet (20 mg total) by mouth at bedtime., Disp: 90 tablet, Rfl: 1   gabapentin (NEURONTIN) 100 MG capsule, Take 100 mg by mouth at bedtime., Disp: , Rfl:    HYDROcodone bit-homatropine (HYCODAN) 5-1.5 MG/5ML syrup, Take 5 mLs by mouth every 8 (eight) hours as needed for cough., Disp: 120 mL, Rfl: 0   linaclotide (LINZESS) 72 MCG capsule, Take 1 capsule (72 mcg total) by mouth daily before breakfast., Disp: 30 capsule, Rfl: 6   omeprazole (PRILOSEC) 40 MG capsule, Take 1 capsule (40 mg total) by mouth daily., Disp: 30 capsule, Rfl: 11   predniSONE (DELTASONE) 50 MG tablet, Take 1 tablet (50 mg total) by mouth daily with breakfast., Disp: 5 tablet, Rfl: 0  Allergies  Allergen Reactions   Ambien [Zolpidem]     OBJECTIVE: BP 127/81   Pulse 75   Temp 98.9 F (37.2 C) (Temporal)  Gen: No acute distress. Nontoxic in  appearance.  HENT: AT. [place].  MMM.  Eyes:Pupils Equal Round Reactive to light, Extraocular movements intact,  Conjunctiva without redness, discharge or icterus. Chest: Cough-harsh, present on exam.  No shortness of breath Skin: No rashes, purpura or petechiae.  Neuro:  Alert. Oriented x3  Psych: Normal affect and demeanor. Normal speech. Normal thought content and judgment.  ASSESSMENT AND PLAN: Cassara Nida is a 55 y.o. female present for  Acute bacterial sinusitis/bronchitis Rest, hydrate.  +/- flonase, mucinex (DM if cough), nettie pot or nasal saline.   Augmentin and prednisone burst prescribed, take until completed.  Hycodan cough syrup prescribed If cough present it can last up to 6-8 weeks.  F/U 2 weeks if not improved.     Howard Pouch, DO 03/04/2021   Return if symptoms worsen or fail to improve.  No orders of the defined types were placed in this encounter.  Meds ordered this encounter  Medications   amoxicillin-clavulanate (AUGMENTIN) 875-125 MG tablet    Sig: Take 1 tablet by mouth 2 (two) times daily.    Dispense:  20 tablet    Refill:  0   predniSONE (DELTASONE) 50 MG tablet    Sig: Take 1 tablet (50 mg total) by mouth daily with breakfast.    Dispense:  5 tablet    Refill:  0   HYDROcodone bit-homatropine (HYCODAN) 5-1.5 MG/5ML syrup    Sig: Take 5 mLs by mouth every 8 (eight) hours as needed for cough.    Dispense:  120 mL    Refill:  0   Referral Orders  No referral(s) requested today

## 2021-03-04 NOTE — Patient Instructions (Signed)
Great to see you today.  I have refilled the medication(s) we provide.   If labs were collected, we will inform you of lab results once received either by echart message or telephone call.   - echart message- for normal results that have been seen by the patient already.   - telephone call: abnormal results or if patient has not viewed results in their echart.  

## 2021-03-11 ENCOUNTER — Other Ambulatory Visit: Payer: Self-pay | Admitting: Family Medicine

## 2021-03-12 ENCOUNTER — Telehealth: Payer: Self-pay

## 2021-03-12 NOTE — Telephone Encounter (Signed)
Pt informed that medication was sent for 6 mos during appt;

## 2021-03-12 NOTE — Telephone Encounter (Signed)
Patient refill request.  [facility]   clonazePAM (KLONOPIN) 0.5 MG tablet [[id number]]

## 2021-04-22 ENCOUNTER — Ambulatory Visit
Admission: RE | Admit: 2021-04-22 | Discharge: 2021-04-22 | Disposition: A | Discharge status: Home / Self Care | Payer: Federal, State, Local not specified - PPO | Source: Ambulatory Visit | Attending: Family Medicine | Admitting: Family Medicine

## 2021-04-22 DIAGNOSIS — Z1231 Encounter for screening mammogram for malignant neoplasm of breast: Secondary | ICD-10-CM | POA: Diagnosis not present

## 2021-05-12 DIAGNOSIS — J069 Acute upper respiratory infection, unspecified: Secondary | ICD-10-CM | POA: Diagnosis not present

## 2021-07-07 ENCOUNTER — Other Ambulatory Visit: Payer: Self-pay | Admitting: Gastroenterology

## 2021-08-09 ENCOUNTER — Encounter: Payer: Federal, State, Local not specified - PPO | Admitting: Family Medicine

## 2021-08-12 ENCOUNTER — Encounter: Payer: Federal, State, Local not specified - PPO | Admitting: Family Medicine

## 2021-08-15 ENCOUNTER — Telehealth: Payer: Self-pay

## 2021-08-15 NOTE — Telephone Encounter (Signed)
Patient refill request.  Almost out of meds.  Will not have enough to get her to next appt with Dr. Raoul Pitch, which is scheduled on 4/27. ? ?Patient requesting refill on meds for a 30d/s OR if provider will not approve, at least approve enough meds to get her to the appt on 4/27.    [facility] ? ?Patient can be reached at [phone].  ? ?clonazePAM (KLONOPIN) 0.5 MG tablet [[id number]]  ? ? ?

## 2021-08-15 NOTE — Telephone Encounter (Signed)
Called pt and informed her that PCP cannot refill prior to appt for law purposes . Pt stated that she was originally sched for Monday 4/10 but was asked to r/s. Pt is currently sched for CPE. Pt want to know if she can come in sooner for CPE or have refill to last to appt. Please advise.  ?

## 2021-08-16 NOTE — Telephone Encounter (Signed)
She can come in sooner for CPE,. ?

## 2021-08-16 NOTE — Telephone Encounter (Signed)
Pt sched Monday at 240 for CPE ?

## 2021-08-19 ENCOUNTER — Encounter: Payer: Self-pay | Admitting: Family Medicine

## 2021-08-19 ENCOUNTER — Ambulatory Visit (INDEPENDENT_AMBULATORY_CARE_PROVIDER_SITE_OTHER): Payer: Federal, State, Local not specified - PPO | Admitting: Family Medicine

## 2021-08-19 VITALS — BP 103/73 | HR 87 | Temp 97.7°F | Ht 62.5 in | Wt 183.0 lb

## 2021-08-19 DIAGNOSIS — K219 Gastro-esophageal reflux disease without esophagitis: Secondary | ICD-10-CM | POA: Diagnosis not present

## 2021-08-19 DIAGNOSIS — R7303 Prediabetes: Secondary | ICD-10-CM | POA: Diagnosis not present

## 2021-08-19 DIAGNOSIS — Z1231 Encounter for screening mammogram for malignant neoplasm of breast: Secondary | ICD-10-CM

## 2021-08-19 DIAGNOSIS — Z0000 Encounter for general adult medical examination without abnormal findings: Secondary | ICD-10-CM | POA: Diagnosis not present

## 2021-08-19 DIAGNOSIS — E669 Obesity, unspecified: Secondary | ICD-10-CM | POA: Diagnosis not present

## 2021-08-19 DIAGNOSIS — F418 Other specified anxiety disorders: Secondary | ICD-10-CM | POA: Diagnosis not present

## 2021-08-19 DIAGNOSIS — G43001 Migraine without aura, not intractable, with status migrainosus: Secondary | ICD-10-CM

## 2021-08-19 DIAGNOSIS — E782 Mixed hyperlipidemia: Secondary | ICD-10-CM | POA: Diagnosis not present

## 2021-08-19 DIAGNOSIS — F5101 Primary insomnia: Secondary | ICD-10-CM

## 2021-08-19 MED ORDER — ESCITALOPRAM OXALATE 20 MG PO TABS
20.0000 mg | ORAL_TABLET | Freq: Every day | ORAL | 1 refills | Status: DC
Start: 2021-08-19 — End: 2022-02-04

## 2021-08-19 MED ORDER — CLONAZEPAM 0.5 MG PO TABS: ORAL_TABLET | ORAL | 5 refills | Status: DC

## 2021-08-19 MED ORDER — BUPROPION HCL ER (XL) 150 MG PO TB24: 150.0000 mg | ORAL_TABLET | Freq: Every day | ORAL | 1 refills | Status: DC

## 2021-08-19 NOTE — Patient Instructions (Addendum)
Return in about 24 weeks (around 02/03/2022) for Routine chronic condition follow-up. ? ?Great to see you today.  ?I have refilled the medication(s) we provide.  ? ?If labs were collected, we will inform you of lab results once received either by echart message or telephone call.  ? - echart message- for normal results that have been seen by the patient already.  ? - telephone call: abnormal results or if patient has not viewed results in their echart. ?  ?Health Maintenance, Female ?Adopting a healthy lifestyle and getting preventive care are important in promoting health and wellness. Ask your health care provider about: ?The right schedule for you to have regular tests and exams. ?Things you can do on your own to prevent diseases and keep yourself healthy. ?What should I know about diet, weight, and exercise? ?Eat a healthy diet ? ?Eat a diet that includes plenty of vegetables, fruits, low-fat dairy products, and lean protein. ?Do not eat a lot of foods that are high in solid fats, added sugars, or sodium. ?Maintain a healthy weight ?Body mass index (BMI) is used to identify weight problems. It estimates body fat based on height and weight. Your health care provider can help determine your BMI and help you achieve or maintain a healthy weight. ?Get regular exercise ?Get regular exercise. This is one of the most important things you can do for your health. Most adults should: ?Exercise for at least 150 minutes each week. The exercise should increase your heart rate and make you sweat (moderate-intensity exercise). ?Do strengthening exercises at least twice a week. This is in addition to the moderate-intensity exercise. ?Spend less time sitting. Even light physical activity can be beneficial. ?Watch cholesterol and blood lipids ?Have your blood tested for lipids and cholesterol at 55 years of age, then have this test every 5 years. ?Have your cholesterol levels checked more often if: ?Your lipid or cholesterol  levels are high. ?You are older than 55 years of age. ?You are at high risk for heart disease. ?What should I know about cancer screening? ?Depending on your health history and family history, you may need to have cancer screening at various ages. This may include screening for: ?Breast cancer. ?Cervical cancer. ?Colorectal cancer. ?Skin cancer. ?Lung cancer. ?What should I know about heart disease, diabetes, and high blood pressure? ?Blood pressure and heart disease ?High blood pressure causes heart disease and increases the risk of stroke. This is more likely to develop in people who have high blood pressure readings or are overweight. ?Have your blood pressure checked: ?Every 3-5 years if you are 35-30 years of age. ?Every year if you are 56 years old or older. ?Diabetes ?Have regular diabetes screenings. This checks your fasting blood sugar level. Have the screening done: ?Once every three years after age 35 if you are at a normal weight and have a low risk for diabetes. ?More often and at a younger age if you are overweight or have a high risk for diabetes. ?What should I know about preventing infection? ?Hepatitis B ?If you have a higher risk for hepatitis B, you should be screened for this virus. Talk with your health care provider to find out if you are at risk for hepatitis B infection. ?Hepatitis C ?Testing is recommended for: ?Everyone born from [birth year] through 1964. ?Anyone with known risk factors for hepatitis C. ?Sexually transmitted infections (STIs) ?Get screened for STIs, including gonorrhea and chlamydia, if: ?You are sexually active and are younger than 55 years of  age. ?You are older than 55 years of age and your health care provider tells you that you are at risk for this type of infection. ?Your sexual activity has changed since you were last screened, and you are at increased risk for chlamydia or gonorrhea. Ask your health care provider if you are at risk. ?Ask your health care provider about  whether you are at high risk for HIV. Your health care provider may recommend a prescription medicine to help prevent HIV infection. If you choose to take medicine to prevent HIV, you should first get tested for HIV. You should then be tested every 3 months for as long as you are taking the medicine. ?Pregnancy ?If you are about to stop having your period (premenopausal) and you may become pregnant, seek counseling before you get pregnant. ?Take 400 to 800 micrograms (mcg) of folic acid every day if you become pregnant. ?Ask for birth control (contraception) if you want to prevent pregnancy. ?Osteoporosis and menopause ?Osteoporosis is a disease in which the bones lose minerals and strength with aging. This can result in bone fractures. If you are 68 years old or older, or if you are at risk for osteoporosis and fractures, ask your health care provider if you should: ?Be screened for bone loss. ?Take a calcium or vitamin D supplement to lower your risk of fractures. ?Be given hormone replacement therapy (HRT) to treat symptoms of menopause. ?Follow these instructions at home: ?Alcohol use ?Do not drink alcohol if: ?Your health care provider tells you not to drink. ?You are pregnant, may be pregnant, or are planning to become pregnant. ?If you drink alcohol: ?Limit how much you have to: ?0-1 drink a day. ?Know how much alcohol is in your drink. In the [place] one drink equals one 12 oz bottle of beer (355 mL), one 5 oz glass of wine (148 mL), or one 1? oz glass of hard liquor (44 mL). ?Lifestyle ?Do not use any products that contain nicotine or tobacco. These products include cigarettes, chewing tobacco, and vaping devices, such as e-cigarettes. If you need help quitting, ask your health care provider. ?Do not use street drugs. ?Do not share needles. ?Ask your health care provider for help if you need support or information about quitting drugs. ?General instructions ?Schedule regular health, dental, and eye  exams. ?Stay current with your vaccines. ?Tell your health care provider if: ?You often feel depressed. ?You have ever been abused or do not feel safe at home. ?Summary ?Adopting a healthy lifestyle and getting preventive care are important in promoting health and wellness. ?Follow your health care provider's instructions about healthy diet, exercising, and getting tested or screened for diseases. ?Follow your health care provider's instructions on monitoring your cholesterol and blood pressure. ?This information is not intended to replace advice given to you by your health care provider. Make sure you discuss any questions you have with your health care provider. ?Document Revised: 09/10/2020 Document Reviewed: 09/10/2020 ?Elsevier Patient Education ? [place]. ? ?

## 2021-08-19 NOTE — Progress Notes (Signed)
? ?This visit occurred during the SARS-CoV-2 public health emergency.  Safety protocols were in place, including screening questions prior to the visit, additional usage of staff PPE, and extensive cleaning of exam room while observing appropriate contact time as indicated for disinfecting solutions.  ? ? ?Patient ID: Lindsay Hamilton, female  DOB: [birth year], 56 y.o.   MRN: [id number] ?Patient Care Team  ?  Relationship Specialty Notifications Start End  ?[name], DO PCP - General Family Medicine  04/15/19   ?Sharia Reeve, MD Referring Physician Psychiatry  09/14/13   ?Garnet Sierras, DO Consulting Physician Allergy  03/17/19   ?Clarene Essex, MD Consulting Physician Gastroenterology  04/15/19   ?Vickey Huger, MD Consulting Physician Orthopedic Surgery  04/15/19   ?Donia Ast, [place]  Orthopedic Surgery  04/15/19   ?Roel Cluck, MD Referring Physician Ophthalmology  04/15/19   ?Lelon Perla, MD Consulting Physician Cardiology  04/15/19   ?Kathrynn Ducking, MD (Inactive) Consulting Physician Neurology  04/15/19   ?Arvella Nigh, MD Consulting Physician Obstetrics and Gynecology  04/15/19   ?Juluis Rainier  Optometry  04/15/19   ?Clabe Seal, DMD  Orthodontics  04/15/19   ? ? ?Chief Complaint  ?Patient presents with  ? Annual Exam  ?  Pt is fasting  ? ? ?Subjective: ? ?Lindsay Hamilton is a 56 y.o.  Female  present for CPE/CMC. ?All past medical history, surgical history, allergies, family history, immunizations, medications and social history were updated in the electronic medical record today. ?All recent labs, ED visits and hospitalizations within the last year were reviewed. ? ?Health maintenance:  ?Colonoscopy: completed 09/2019, by Dr. Watt Climes, follow up 5 years. ?Mammogram: completed: 04/2021 [facility] [place] ?Cervical cancer screening: Hysterectomy ?Immunizations: tdap UTD 2014, Influenza UTD 2022 (encouraged yearly), Shingrix series completed,  Covid x3 ?Infectious disease  screening: HIV completed, Hep C completed ?DEXA: Routine screening ?Assistive device: none ?Oxygen LZJ:QBHA ?Patient has a Dental home. ?Hospitalizations/ED visits: reviewed ?  ?Insomnia, unspecified type/Depression with anxiety ?Patient states med regimen is working very well for her;  ?She reports compliance with Wellbutrin 150 mg daily and Lexapro 20 mg daily. compliant  with klonopin 0.5 mg 1/2 tab in the day and 1-2 tabs qhs.  ?Her daughter is getting married this May 21st. ?Prior note: ?Patient reports overall she is doing well since the changes in medication.  She is sleeping well with using the Klonopin 0.5 mg nightly.  She has not used the [place] and feels she is getting good sleep with the Klonopin.  She does endorse having some increase in her emotional state, feeling more tearful at times since stopping the Lunesta and the Xanax.  She has been taking the Xanax twice daily.  However she has only been taking the Klonopin at night.  It was written to allow her to have 1 Klonopin before bed and a half a tab during the day.  She states she has not been taking the Klonopin during the day. ?  ? ? ?  08/19/2021  ?  2:37 PM 03/04/2021  ?  2:08 PM 01/28/2021  ?  9:19 AM 08/08/2020  ? 10:37 AM 01/27/2020  ?  1:11 PM  ?Depression screen PHQ 2/9  ?Decreased Interest 2 0 0  0  ?Down, Depressed, Hopeless 2 0 1 1 0  ?PHQ - 2 Score 4 0 1 1 0  ?Altered sleeping '2  1 2 '$ 0  ?Tired, decreased energy '1  1 1 '$ 0  ?  Change in appetite 0  0 1 0  ?Feeling bad or failure about yourself  0  0 0 0  ?Trouble concentrating 3  0 0 0  ?Moving slowly or fidgety/restless '1  1 1 '$ 0  ?Suicidal thoughts 0  0 0 0  ?PHQ-9 Score '11  4 6 '$ 0  ? ? ?  08/19/2021  ?  2:37 PM 01/28/2021  ?  9:20 AM 08/08/2020  ? 10:37 AM 01/27/2020  ?  1:12 PM  ?GAD 7 : Generalized Anxiety Score  ?Nervous, Anxious, on Edge '1 1 2 '$ 0  ?Control/stop worrying 1 0 0 0  ?Worry too much - different things '1 1 1 '$ 0  ?Trouble relaxing 2 2 0 0  ?Restless 1 1  0  ?Easily annoyed or irritable '1  2 1 3  '$ ?Afraid - awful might happen 0 0 0 0  ?Total GAD 7 Score '7 7  3  '$ ?Anxiety Difficulty    Not difficult at all  ? ? ?Immunization History  ?Administered Date(s) Administered  ? Influenza Split 04/08/2013, 02/17/2015, 02/16/2019  ? Influenza,inj,Quad PF,6+ Mos 01/21/2021  ? Influenza-Unspecified 02/16/2019, 02/03/2020, 02/14/2020  ? PFIZER(Purple Top)SARS-COV-2 Vaccination 05/17/2019, 06/07/2019, 01/27/2020, 01/21/2021  ? Pension scheme manager 20yr & up 01/21/2021  ? Tdap 04/08/2013  ? Zoster Recombinat (Shingrix) 07/05/2019, 12/23/2019  ? Zoster, Live 07/18/2019  ? ? ? ?Past Medical History:  ?Diagnosis Date  ? Alcohol abuse, in remission   ? Depression   ? Head injury   ? Headaches due to old head injury   ? Ingrowing nail 02/20/2020  ? Insomnia 09/14/2013  ? Migraine without aura, without mention of intractable migraine without mention of status migrainosus 09/14/2013  ? Obese   ? Pancreatitis   ? Pseudoseizures   ? Right upper quadrant pain 06/26/2020  ? Tubular adenoma of colon 2018  ? ?No Known Allergies ?Past Surgical History:  ?Procedure Laterality Date  ? ABDOMINAL HYSTERECTOMY  2009  ? Partial  ? CARPAL TUNNEL RELEASE  1998  ? CESAREAN SECTION    ? x2-1992, 1997  ? CHOLECYSTECTOMY  2002  ? COLONOSCOPY W/ BIOPSIES  05/2016  ? tubular adenoma  ? GREAT TOE ARTHRODESIS, METATARSALPHALANGEAL JOINT Right 07/17/2018  ? KNEE ARTHROSCOPY    ? bilateral, 3 right and 3 left  ? SHOULDER ARTHROSCOPY Bilateral   ? Right 2012, left 2014  ? ?Family History  ?Problem Relation Age of Onset  ? Hypertension Mother   ? CAD Mother   ?     Died of MI at age 55 ? Asthma Mother   ? Pulmonary embolism Mother   ?     x2  ? Cancer - Prostate Father   ? Atrial fibrillation Father   ? Hyperlipidemia Father   ? Seizures Sister   ? Migraines Sister   ? Hypertension Brother   ? Hypertension Brother   ? Lung cancer Paternal Grandmother   ? Breast cancer Neg Hx   ? Allergic rhinitis Neg Hx   ? Eczema Neg Hx   ?  Urticaria Neg Hx   ? ?Social History  ? ?Social History Narrative  ? Marital status/children/pets: Married.  2 children.  ? Education/employment: High school graduate with some college.  Works as a sStage manager  ? Safety:   ?   -smoke alarm in the home:Yes  ?   - wears seatbelt: Yes  ?   - Feels safe in their relationships: Yes  ? ? ?Allergies as of  08/19/2021   ?No Known Allergies ?  ? ?  ?Medication List  ?  ? ?  ? Accurate as of August 19, 2021  2:49 PM. If you have any questions, ask your nurse or doctor.  ?  ?  ? ?  ? ?STOP taking these medications   ? ?amoxicillin-clavulanate 875-125 MG tablet ?Commonly known as: AUGMENTIN ?Stopped by: Howard Pouch, DO ?  ?HYDROcodone bit-homatropine 5-1.5 MG/5ML syrup ?Commonly known as: HYCODAN ?Stopped by: Howard Pouch, DO ?  ?predniSONE 50 MG tablet ?Commonly known as: DELTASONE ?Stopped by: Howard Pouch, DO ?  ? ?  ? ?TAKE these medications   ? ?buPROPion 150 MG 24 hr tablet ?Commonly known as: WELLBUTRIN XL ?Take 1 tablet (150 mg total) by mouth daily. ?  ?celecoxib 200 MG capsule ?Commonly known as: CELEBREX ?Take 200 mg by mouth 2 (two) times daily. ?  ?clonazePAM 0.5 MG tablet ?Commonly known as: KLONOPIN ?1/2 tab during the day and 1-2 tabs QHS and ?  ?escitalopram 20 MG tablet ?Commonly known as: LEXAPRO ?Take 1 tablet (20 mg total) by mouth at bedtime. ?  ?gabapentin 100 MG capsule ?Commonly known as: NEURONTIN ?Take 100 mg by mouth at bedtime. ?  ?Linzess 72 MCG capsule ?Generic drug: linaclotide ?TAKE 1 CAPSULE BY MOUTH DAILY BEFORE BREAKFAST. ?  ?omeprazole 40 MG capsule ?Commonly known as: PRILOSEC ?Take 1 capsule (40 mg total) by mouth daily. ?  ? ?  ? ? ?All past medical history, surgical history, allergies, family history, immunizations andmedications were updated in the EMR today and reviewed under the history and medication portions of their EMR.    ? ?No results found for this or any previous visit (from the past 2160 hour(s)). ? ?MM  3D SCREEN BREAST BILATERAL ?Result Date: 04/22/2021 ?CLINICAL DATA:  Screening. EXAM: DIGITAL SCREENING BILATERAL MAMMOGRAM WITH TOMOSYNTHESIS AND CAD TECHNIQUE: Bilateral screening digital craniocaudal and mediolater

## 2021-08-20 LAB — TSH: TSH: 0.83 mIU/L

## 2021-08-20 LAB — LIPID PANEL
Cholesterol: 201 mg/dL — ABNORMAL HIGH (ref <200)
HDL: 56 mg/dL (ref >=50)
LDL Cholesterol (Calc): 128 mg/dL (calc) — ABNORMAL HIGH
Non-HDL Cholesterol (Calc): 145 mg/dL (calc) — ABNORMAL HIGH (ref <130)
Total CHOL/HDL Ratio: 3.6 (calc) (ref <5.0)
Triglycerides: 76 mg/dL (ref <150)

## 2021-08-20 LAB — COMPREHENSIVE METABOLIC PANEL
AG Ratio: 1.6 (calc) (ref 1.0–2.5)
ALT: 17 U/L (ref 6–29)
AST: 21 U/L (ref 10–35)
Albumin: 4.4 g/dL (ref 3.6–5.1)
Alkaline phosphatase (APISO): 57 U/L (ref 37–153)
BUN: 13 mg/dL (ref 7–25)
CO2: 23 mmol/L (ref 20–32)
Calcium: 9.5 mg/dL (ref 8.6–10.4)
Chloride: 101 mmol/L (ref 98–110)
Creat: 0.77 mg/dL (ref 0.50–1.03)
Globulin: 2.7 g/dL (calc) (ref 1.9–3.7)
Glucose, Bld: 78 mg/dL (ref 65–99)
Potassium: 4.2 mmol/L (ref 3.5–5.3)
Sodium: 135 mmol/L (ref 135–146)
Total Bilirubin: 0.5 mg/dL (ref 0.2–1.2)
Total Protein: 7.1 g/dL (ref 6.1–8.1)

## 2021-08-20 LAB — CBC WITH DIFFERENTIAL/PLATELET
Absolute Monocytes: 320 cells/uL (ref 200–950)
Basophils Absolute: 32 cells/uL (ref 0–200)
Basophils Relative: 0.5 %
Eosinophils Absolute: 90 cells/uL (ref 15–500)
Eosinophils Relative: 1.4 %
HCT: 39.5 % (ref 35.0–45.0)
Hemoglobin: 12.6 g/dL (ref 11.7–15.5)
Lymphs Abs: 2720 cells/uL (ref 850–3900)
MCH: 28 pg (ref 27.0–33.0)
MCHC: 31.9 g/dL — ABNORMAL LOW (ref 32.0–36.0)
MCV: 87.8 fL (ref 80.0–100.0)
MPV: 11 fL (ref 7.5–12.5)
Monocytes Relative: 5 %
Neutro Abs: 3238 cells/uL (ref 1500–7800)
Neutrophils Relative %: 50.6 %
Platelets: 247 10*3/uL (ref 140–400)
RBC: 4.5 10*6/uL (ref 3.80–5.10)
RDW: 13.6 % (ref 11.0–15.0)
Total Lymphocyte: 42.5 %
WBC: 6.4 10*3/uL (ref 3.8–10.8)

## 2021-08-20 LAB — HEMOGLOBIN A1C
Hgb A1c MFr Bld: 5.7 % of total Hgb — ABNORMAL HIGH (ref <5.7)
Mean Plasma Glucose: 117 mg/dL
eAG (mmol/L): 6.5 mmol/L

## 2021-08-24 IMAGING — MG DIGITAL SCREENING BILAT W/ TOMO W/ CAD
7 series · 8 of 19 positions shown · non-contrast
Comparison: Previous exam(s).

CLINICAL DATA: Screening.

EXAM:
DIGITAL SCREENING BILATERAL MAMMOGRAM WITH TOMO AND CAD

[L MLO synth-2D]
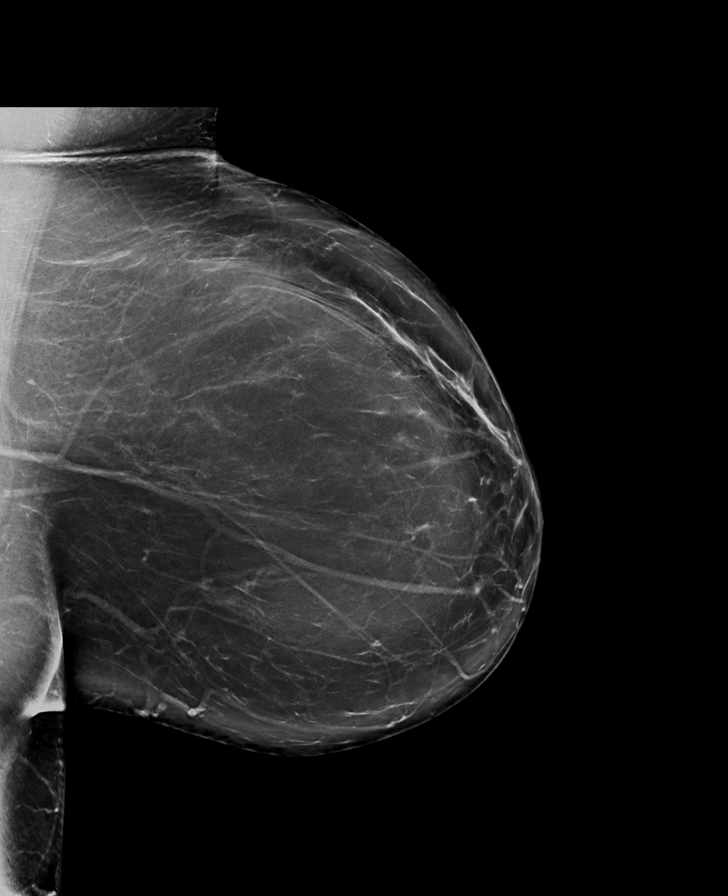

[R MLO synth-2D]
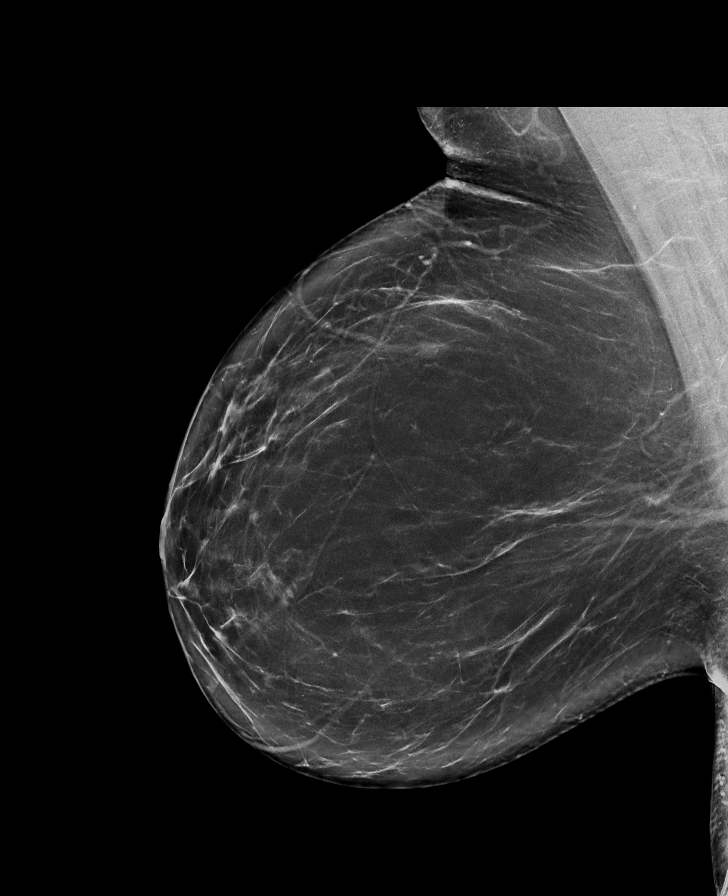

[R CC synth-2D]
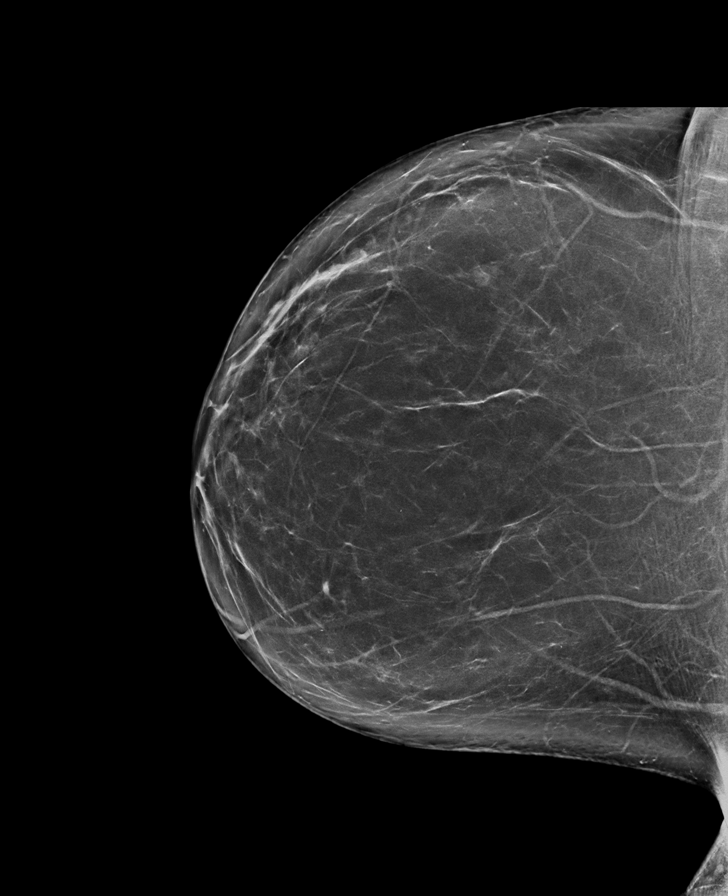

[L CC synth-2D]
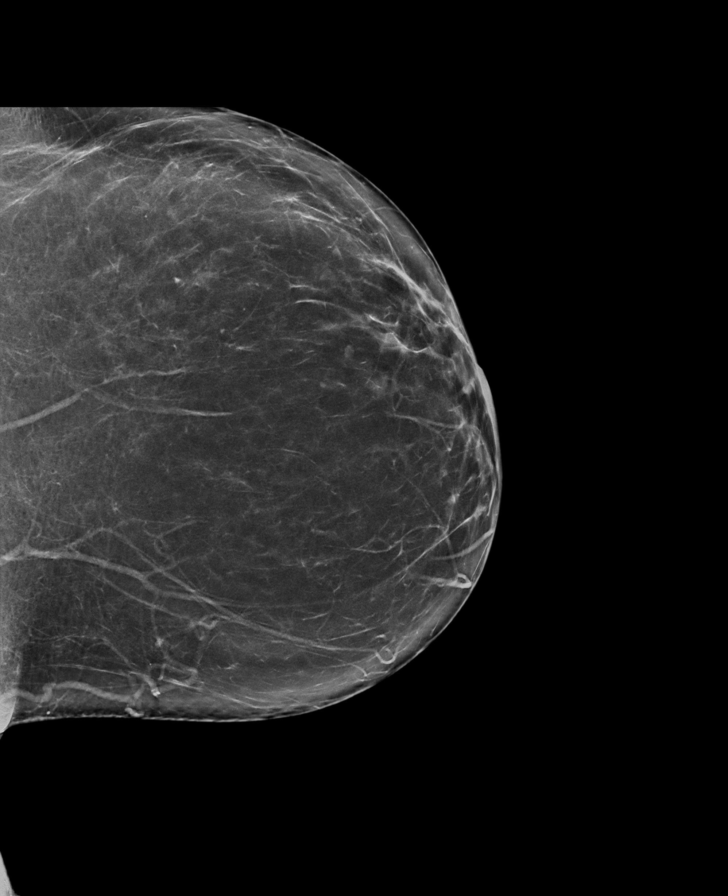

[L MLO tomo · 2 of 100 frames shown]
[frame 33/100]
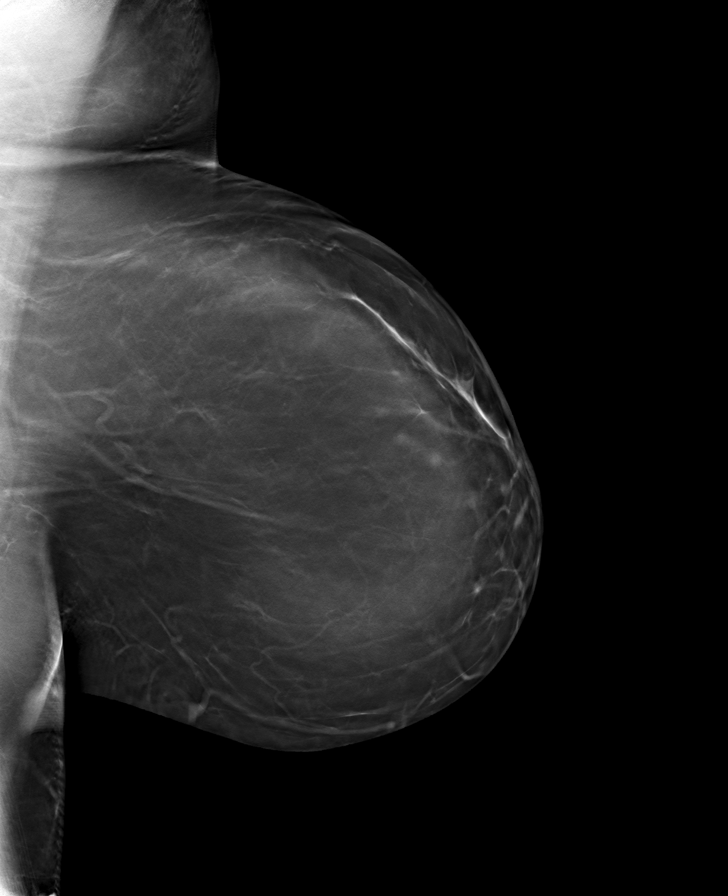
[frame 51/100]
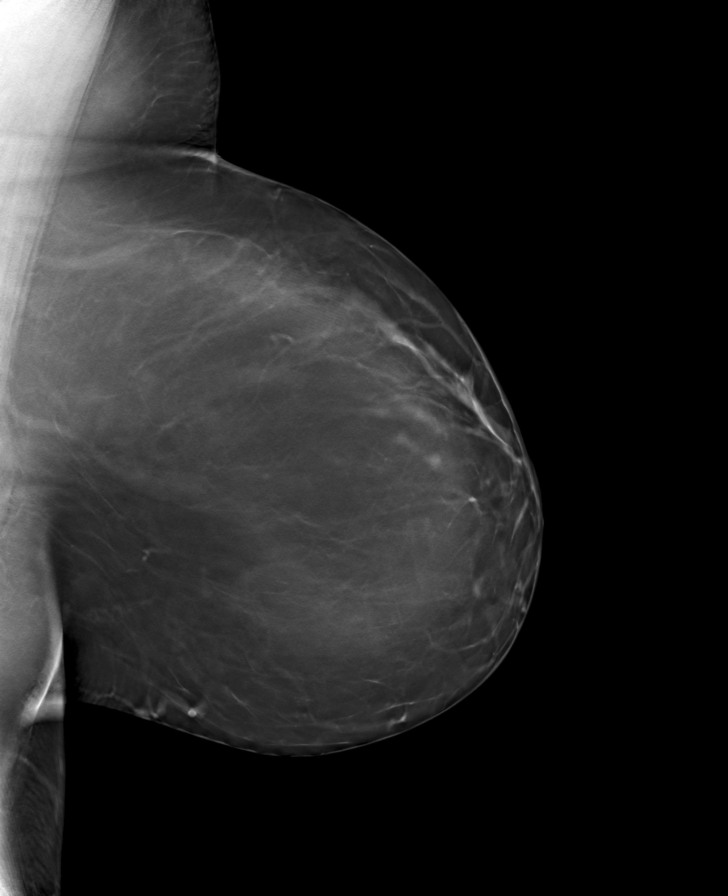

[R MLO tomo · tomo slice 50/99.0]
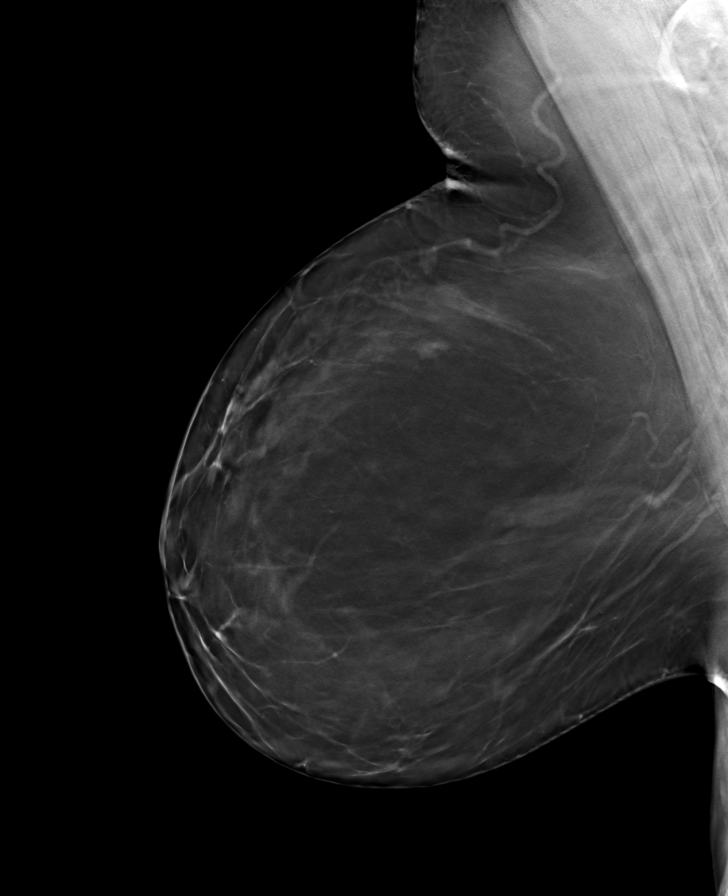

[R CC tomo · tomo slice 44/87.0]
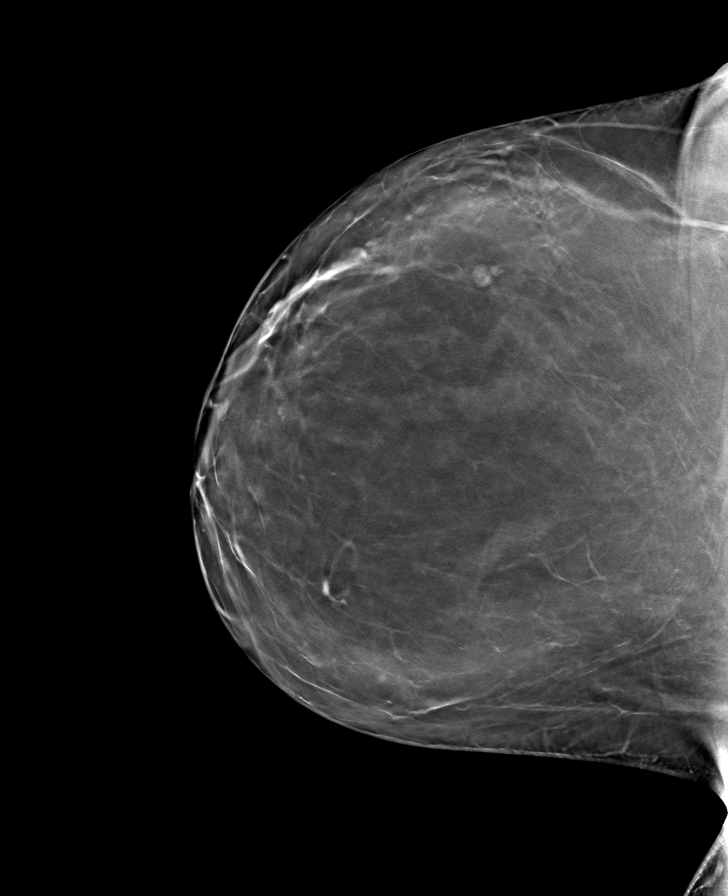

[8 of 19 positions shown; findings below may reference images not displayed]

ACR Breast Density Category b: There are scattered areas of
fibroglandular density.
FINDINGS: There are no findings suspicious for malignancy. Images were
processed with CAD.
IMPRESSION: No mammographic evidence of malignancy. A result letter of this
screening mammogram will be mailed directly to the patient.

RECOMMENDATION:
Screening mammogram in one year. (Code:[id number])

BI-RADS CATEGORY  1: Negative.

## 2021-08-29 ENCOUNTER — Encounter: Payer: Federal, State, Local not specified - PPO | Admitting: Family Medicine

## 2021-10-10 DIAGNOSIS — Z13820 Encounter for screening for osteoporosis: Secondary | ICD-10-CM | POA: Diagnosis not present

## 2021-10-10 DIAGNOSIS — Z01419 Encounter for gynecological examination (general) (routine) without abnormal findings: Secondary | ICD-10-CM | POA: Diagnosis not present

## 2021-11-12 ENCOUNTER — Inpatient Hospital Stay (HOSPITAL_BASED_OUTPATIENT_CLINIC_OR_DEPARTMENT_OTHER)
Admission: EM | Admit: 2021-11-12 | Discharge: 2021-11-14 | DRG: 872 | Disposition: A | Discharge status: Home / Self Care | Payer: Federal, State, Local not specified - PPO | Attending: Family Medicine | Admitting: Family Medicine

## 2021-11-12 ENCOUNTER — Other Ambulatory Visit: Payer: Self-pay

## 2021-11-12 ENCOUNTER — Encounter (HOSPITAL_BASED_OUTPATIENT_CLINIC_OR_DEPARTMENT_OTHER): Payer: Self-pay | Admitting: Emergency Medicine

## 2021-11-12 ENCOUNTER — Emergency Department (HOSPITAL_BASED_OUTPATIENT_CLINIC_OR_DEPARTMENT_OTHER): Payer: Federal, State, Local not specified - PPO

## 2021-11-12 DIAGNOSIS — Z90711 Acquired absence of uterus with remaining cervical stump: Secondary | ICD-10-CM

## 2021-11-12 DIAGNOSIS — A419 Sepsis, unspecified organism: Principal | ICD-10-CM | POA: Diagnosis present

## 2021-11-12 DIAGNOSIS — Z87891 Personal history of nicotine dependence: Secondary | ICD-10-CM | POA: Diagnosis not present

## 2021-11-12 DIAGNOSIS — R7401 Elevation of levels of liver transaminase levels: Secondary | ICD-10-CM

## 2021-11-12 DIAGNOSIS — E871 Hypo-osmolality and hyponatremia: Secondary | ICD-10-CM

## 2021-11-12 DIAGNOSIS — Z86010 Personal history of colonic polyps: Secondary | ICD-10-CM

## 2021-11-12 DIAGNOSIS — F418 Other specified anxiety disorders: Secondary | ICD-10-CM | POA: Diagnosis not present

## 2021-11-12 DIAGNOSIS — I700 Atherosclerosis of aorta: Secondary | ICD-10-CM | POA: Diagnosis not present

## 2021-11-12 DIAGNOSIS — R6520 Severe sepsis without septic shock: Secondary | ICD-10-CM | POA: Diagnosis not present

## 2021-11-12 DIAGNOSIS — D649 Anemia, unspecified: Secondary | ICD-10-CM | POA: Diagnosis not present

## 2021-11-12 DIAGNOSIS — Z79899 Other long term (current) drug therapy: Secondary | ICD-10-CM | POA: Diagnosis not present

## 2021-11-12 DIAGNOSIS — B9620 Unspecified Escherichia coli [E. coli] as the cause of diseases classified elsewhere: Secondary | ICD-10-CM | POA: Diagnosis present

## 2021-11-12 DIAGNOSIS — G4700 Insomnia, unspecified: Secondary | ICD-10-CM | POA: Diagnosis not present

## 2021-11-12 DIAGNOSIS — Z9049 Acquired absence of other specified parts of digestive tract: Secondary | ICD-10-CM

## 2021-11-12 DIAGNOSIS — N3289 Other specified disorders of bladder: Secondary | ICD-10-CM | POA: Diagnosis not present

## 2021-11-12 DIAGNOSIS — N12 Tubulo-interstitial nephritis, not specified as acute or chronic: Secondary | ICD-10-CM | POA: Diagnosis not present

## 2021-11-12 DIAGNOSIS — F32A Depression, unspecified: Secondary | ICD-10-CM | POA: Diagnosis not present

## 2021-11-12 DIAGNOSIS — F419 Anxiety disorder, unspecified: Secondary | ICD-10-CM | POA: Diagnosis present

## 2021-11-12 DIAGNOSIS — E876 Hypokalemia: Secondary | ICD-10-CM | POA: Diagnosis not present

## 2021-11-12 DIAGNOSIS — N10 Acute tubulo-interstitial nephritis: Secondary | ICD-10-CM | POA: Diagnosis not present

## 2021-11-12 DIAGNOSIS — Z981 Arthrodesis status: Secondary | ICD-10-CM

## 2021-11-12 DIAGNOSIS — G43009 Migraine without aura, not intractable, without status migrainosus: Secondary | ICD-10-CM | POA: Diagnosis not present

## 2021-11-12 HISTORY — DX: Sepsis, unspecified organism: A41.9

## 2021-11-12 HISTORY — DX: Elevation of levels of liver transaminase levels: R74.01

## 2021-11-12 HISTORY — DX: Hypo-osmolality and hyponatremia: E87.1

## 2021-11-12 HISTORY — DX: Severe sepsis without septic shock: R65.20

## 2021-11-12 LAB — URINALYSIS, MICROSCOPIC (REFLEX): WBC, UA: >50 WBC/hpf (ref 0–5)

## 2021-11-12 LAB — BLOOD CULTURE ID PANEL (REFLEXED) - BCID2

## 2021-11-12 LAB — CBC WITH DIFFERENTIAL/PLATELET
Abs Immature Granulocytes: 0.03 10*3/uL (ref 0.00–0.07)
Basophils Absolute: 0 10*3/uL (ref 0.0–0.1)
Basophils Relative: 0 %
Eosinophils Absolute: 0 10*3/uL (ref 0.0–0.5)
Eosinophils Relative: 0 %
HCT: 37.8 % (ref 36.0–46.0)
Hemoglobin: 12.3 g/dL (ref 12.0–15.0)
Immature Granulocytes: 0 %
Lymphocytes Relative: 9 %
Lymphs Abs: 1 10*3/uL (ref 0.7–4.0)
MCH: 28.1 pg (ref 26.0–34.0)
MCHC: 32.5 g/dL (ref 30.0–36.0)
MCV: 86.5 fL (ref 80.0–100.0)
Monocytes Absolute: 0.8 10*3/uL (ref 0.1–1.0)
Monocytes Relative: 7 %
Neutro Abs: 9.7 10*3/uL — ABNORMAL HIGH (ref 1.7–7.7)
Neutrophils Relative %: 84 %
Platelets: 280 10*3/uL (ref 150–400)
RBC: 4.37 MIL/uL (ref 3.87–5.11)
RDW: 13.4 % (ref 11.5–15.5)
WBC: 11.6 10*3/uL — ABNORMAL HIGH (ref 4.0–10.5)
nRBC: 0 % (ref 0.0–0.2)

## 2021-11-12 LAB — URINALYSIS, ROUTINE W REFLEX MICROSCOPIC
Bilirubin Urine: NEGATIVE
Glucose, UA: NEGATIVE mg/dL
Ketones, ur: 80 mg/dL — AB
Nitrite: POSITIVE — AB
Protein, ur: 100 mg/dL — AB
Specific Gravity, Urine: 1.02 (ref 1.005–1.030)
pH: 5.5 (ref 5.0–8.0)

## 2021-11-12 LAB — LIPASE, BLOOD: Lipase: 29 U/L (ref 11–51)

## 2021-11-12 LAB — GLUCOSE, CAPILLARY: Glucose-Capillary: 103 mg/dL — ABNORMAL HIGH (ref 70–99)

## 2021-11-12 LAB — COMPREHENSIVE METABOLIC PANEL
ALT: 83 U/L — ABNORMAL HIGH (ref 0–44)
AST: 48 U/L — ABNORMAL HIGH (ref 15–41)
Albumin: 3.5 g/dL (ref 3.5–5.0)
Alkaline Phosphatase: 85 U/L (ref 38–126)
Anion gap: 10 (ref 5–15)
BUN: 9 mg/dL (ref 6–20)
CO2: 23 mmol/L (ref 22–32)
Calcium: 8.9 mg/dL (ref 8.9–10.3)
Chloride: 101 mmol/L (ref 98–111)
Creatinine, Ser: 0.83 mg/dL (ref 0.44–1.00)
GFR, Estimated: >60 mL/min (ref >60)
Glucose, Bld: 117 mg/dL — ABNORMAL HIGH (ref 70–99)
Potassium: 3.6 mmol/L (ref 3.5–5.1)
Sodium: 134 mmol/L — ABNORMAL LOW (ref 135–145)
Total Bilirubin: 1.5 mg/dL — ABNORMAL HIGH (ref 0.3–1.2)
Total Protein: 7.6 g/dL (ref 6.5–8.1)

## 2021-11-12 LAB — LACTIC ACID, PLASMA
Lactic Acid, Venous: 0.9 mmol/L (ref 0.5–1.9)
Lactic Acid, Venous: 1.1 mmol/L (ref 0.5–1.9)

## 2021-11-12 LAB — MRSA NEXT GEN BY PCR, NASAL: MRSA by PCR Next Gen: NOT DETECTED

## 2021-11-12 MED ORDER — ACETAMINOPHEN 325 MG PO TABS
650.0000 mg | ORAL_TABLET | Freq: Four times a day (QID) | ORAL | Status: DC | PRN
  Administered 2021-11-12 – 2021-11-13 (×3): 650 mg via ORAL
  Filled 2021-11-12 (×3): qty 2

## 2021-11-12 MED ORDER — ORAL CARE MOUTH RINSE: 15.0000 mL | OROMUCOSAL | Status: DC | PRN

## 2021-11-12 MED ORDER — SODIUM CHLORIDE 0.9 % IV SOLN
1.0000 g | Freq: Once | INTRAVENOUS | Status: AC
  Administered 2021-11-12: 1 g via INTRAVENOUS
  Filled 2021-11-12: qty 10

## 2021-11-12 MED ORDER — CHLORHEXIDINE GLUCONATE CLOTH 2 % EX PADS
6.0000 | MEDICATED_PAD | Freq: Every day | CUTANEOUS | Status: DC
Start: 2021-11-13 — End: 2021-11-14
  Administered 2021-11-12 – 2021-11-13 (×2): 6 via TOPICAL

## 2021-11-12 MED ORDER — ONDANSETRON 4 MG PO TBDP: 4.0000 mg | ORAL_TABLET | Freq: Three times a day (TID) | ORAL | 0 refills | Status: DC | PRN

## 2021-11-12 MED ORDER — ONDANSETRON HCL 4 MG/2ML IJ SOLN
INTRAMUSCULAR | Status: AC
  Administered 2021-11-12: 4 mg via INTRAVENOUS
  Filled 2021-11-12: qty 2

## 2021-11-12 MED ORDER — SODIUM CHLORIDE 0.9 % IV BOLUS
500.0000 mL | Freq: Once | INTRAVENOUS | Status: AC
  Administered 2021-11-12: 500 mL via INTRAVENOUS

## 2021-11-12 MED ORDER — LACTATED RINGERS IV BOLUS
1000.0000 mL | Freq: Once | INTRAVENOUS | Status: AC
  Administered 2021-11-12: 1000 mL via INTRAVENOUS

## 2021-11-12 MED ORDER — SODIUM CHLORIDE 0.9 % IV BOLUS
1000.0000 mL | Freq: Once | INTRAVENOUS | Status: AC
  Administered 2021-11-12: 1000 mL via INTRAVENOUS

## 2021-11-12 MED ORDER — BUPROPION HCL ER (XL) 150 MG PO TB24
150.0000 mg | ORAL_TABLET | Freq: Every day | ORAL | Status: DC
  Administered 2021-11-13 – 2021-11-14 (×2): 150 mg via ORAL
  Filled 2021-11-12 (×2): qty 1

## 2021-11-12 MED ORDER — SODIUM CHLORIDE 0.9 % IV SOLN
2.0000 g | INTRAVENOUS | Status: DC
  Administered 2021-11-13 – 2021-11-14 (×2): 2 g via INTRAVENOUS
  Filled 2021-11-12 (×2): qty 20

## 2021-11-12 MED ORDER — KETOROLAC TROMETHAMINE 15 MG/ML IJ SOLN
30.0000 mg | Freq: Once | INTRAMUSCULAR | Status: AC
Start: 2021-11-12 — End: 2021-11-12
  Administered 2021-11-12: 30 mg via INTRAVENOUS
  Filled 2021-11-12: qty 2

## 2021-11-12 MED ORDER — ONDANSETRON HCL 4 MG/2ML IJ SOLN
4.0000 mg | Freq: Once | INTRAMUSCULAR | Status: AC
  Administered 2021-11-12: 4 mg via INTRAVENOUS
  Filled 2021-11-12: qty 2

## 2021-11-12 MED ORDER — ACETAMINOPHEN 650 MG RE SUPP: 650.0000 mg | Freq: Four times a day (QID) | RECTAL | Status: DC | PRN

## 2021-11-12 MED ORDER — IOHEXOL 300 MG/ML  SOLN
100.0000 mL | Freq: Once | INTRAMUSCULAR | Status: AC | PRN
  Administered 2021-11-12: 100 mL via INTRAVENOUS

## 2021-11-12 MED ORDER — PANTOPRAZOLE SODIUM 20 MG PO TBEC
20.0000 mg | DELAYED_RELEASE_TABLET | Freq: Every day | ORAL | Status: DC | PRN
  Administered 2021-11-13: 20 mg via ORAL
  Filled 2021-11-12 (×2): qty 1

## 2021-11-12 MED ORDER — ONDANSETRON HCL 4 MG/2ML IJ SOLN: 4.0000 mg | Freq: Once | INTRAMUSCULAR | Status: AC

## 2021-11-12 MED ORDER — LACTATED RINGERS IV BOLUS
1000.0000 mL | Freq: Once | INTRAVENOUS | Status: AC
Start: 2021-11-12 — End: 2021-11-12
  Administered 2021-11-12: 1000 mL via INTRAVENOUS

## 2021-11-12 MED ORDER — CEFPODOXIME PROXETIL 200 MG PO TABS: 200.0000 mg | ORAL_TABLET | Freq: Two times a day (BID) | ORAL | 0 refills | Status: DC

## 2021-11-12 MED ORDER — OXYCODONE HCL 5 MG PO TABS
5.0000 mg | ORAL_TABLET | ORAL | Status: DC | PRN
  Administered 2021-11-13 – 2021-11-14 (×2): 5 mg via ORAL
  Filled 2021-11-12 (×3): qty 1

## 2021-11-12 MED ORDER — FENTANYL CITRATE PF 50 MCG/ML IJ SOSY
100.0000 ug | PREFILLED_SYRINGE | Freq: Once | INTRAMUSCULAR | Status: AC
  Administered 2021-11-12: 100 ug via INTRAVENOUS
  Filled 2021-11-12: qty 2

## 2021-11-12 MED ORDER — ENOXAPARIN SODIUM 40 MG/0.4ML IJ SOSY
40.0000 mg | PREFILLED_SYRINGE | INTRAMUSCULAR | Status: DC
  Administered 2021-11-12 – 2021-11-13 (×2): 40 mg via SUBCUTANEOUS
  Filled 2021-11-12 (×2): qty 0.4

## 2021-11-12 MED ORDER — ACETAMINOPHEN 500 MG PO TABS
1000.0000 mg | ORAL_TABLET | Freq: Once | ORAL | Status: AC
  Administered 2021-11-12: 1000 mg via ORAL
  Filled 2021-11-12: qty 2

## 2021-11-12 MED ORDER — ESCITALOPRAM OXALATE 20 MG PO TABS
20.0000 mg | ORAL_TABLET | Freq: Every day | ORAL | Status: DC
  Administered 2021-11-13: 20 mg via ORAL
  Filled 2021-11-12: qty 1

## 2021-11-12 MED ORDER — OXYCODONE HCL 5 MG PO TABS: 5.0000 mg | ORAL_TABLET | ORAL | 0 refills | Status: DC | PRN

## 2021-11-12 NOTE — ED Triage Notes (Signed)
Pt with lower back pain, dysuria, fever, chills and vomiting

## 2021-11-12 NOTE — Discharge Instructions (Addendum)
For your pain, you may take up to 1000mg of acetaminophen (tylenol) 4 times daily for up to a week. This is the maximum dose of acetminophen (tylenol) you can take from all sources. Please check other over-the-counter medications and prescriptions to ensure you are not taking other medications that contain acetaminophen.  You may also take ibuprofen 400 mg 6 times a day OR 600mg 4 times a day alternating with or at the same time as tylenol.  Take oxycodone as needed for breakthrough pain.  This medication can be addicting, sedating and cause constipation.    

## 2021-11-12 NOTE — ED Notes (Addendum)
Pt repositioned from laying on left side to sitting up in bed for recheck of BP

## 2021-11-12 NOTE — Assessment & Plan Note (Signed)
Resolved with fluids °

## 2021-11-12 NOTE — ED Notes (Signed)
Patient transported to CT 

## 2021-11-12 NOTE — Assessment & Plan Note (Signed)
Unclear cause, suspect fatty liver versus recurrence of alcohol use - Recommend outpatient follow-up with PCP

## 2021-11-12 NOTE — ED Notes (Signed)
[facility] Team at bedside 

## 2021-11-12 NOTE — ED Provider Notes (Signed)
Vomiting, dark urine, urinary frequency, lower back/flank tenderness, RLQ tenderness. CT and labs pending.  Physical Exam  BP 125/80   Pulse 89   Temp 98.3 F (36.8 C) (Oral)   Resp 18   Ht '5\' 4"'$  (1.626 m)   Wt 70.3 kg   SpO2 97%   BMI 26.61 kg/m   Physical Exam  Procedures  Procedures  ED Course / MDM    Medical Decision Making Amount and/or Complexity of Data Reviewed Labs: ordered. Radiology: ordered.  Risk Prescription drug management.   56yo female presents with concern for nausea, vomiting

## 2021-11-12 NOTE — Plan of Care (Signed)
57 year old F with PMH of anxiety, depression, insomnia, IBS, migraine headache and heart murmur presenting with right flank and RLQ pain, fever, chills, emesis for few days.  Recently returned from [place]. In ED, febrile to 102.2.  Initially normotensive but BP dropped to 82/58.  Received 3 L LR bolus.  Now normotensive.  Brief episode of tachycardia 100.6.  Mild leukocytosis with left shift.  Lactic acid 0.9.  NA 134.  AST 48.  ALT 83.  UA consistent with UTI.  CT abdomen and pelvis concerning for bilateral pyelonephritis, uretritis and cystitis.  Cultures drawn.  Received 3 LR boluses and IV ceftriaxone.  Hospitalist service called for admission.  Admitting for severe sepsis due to complicated UTI.

## 2021-11-12 NOTE — Assessment & Plan Note (Addendum)
Presented with fever, WBC 11.6K, tachypnea >20, and hypotension.  Urine source.  CT shows no hydronephrosis, no history of stones.  Transferred to stepdown last night, but is stabilized and improving, blood pressure now close to baseline Blood cultures now growing gram-negative rods in 2 of 2. - Continue Rocephin, day 2 of 7 - Follow urine culture for sensitivities

## 2021-11-12 NOTE — ED Provider Notes (Signed)
[facility] DEPART[facility] Provider Note   CSN: [id number] Arrival date & time: 11/12/21  0354     History  Chief Complaint  Patient presents with   Urinary Tract Infection    Lindsay Hamilton is a 56 y.o. female.  56 yo F here with RLQ pain, Right lower CVA pain, emesis, fevers, chills for the last few days after trip to [place]. No suspicious food or water intake there. No known sick contacts. H/o similar symptoms, resolved without complication. Has increased urinary frequency. No documented fevers.    Urinary Tract Infection      Home Medications Prior to Admission medications   Medication Sig Start Date End Date Taking? Authorizing Provider  buPROPion (WELLBUTRIN XL) 150 MG 24 hr tablet Take 1 tablet (150 mg total) by mouth daily. 08/19/21   Kuneff, Renee A, DO  celecoxib (CELEBREX) 200 MG capsule Take 200 mg by mouth 2 (two) times daily. 06/12/20   [provider]  clonazePAM (KLONOPIN) 0.5 MG tablet 1/2 tab during the day and 1-2 tabs QHS and 08/19/21   Kuneff, Renee A, DO  escitalopram (LEXAPRO) 20 MG tablet Take 1 tablet (20 mg total) by mouth at bedtime. 08/19/21   Kuneff, Renee A, DO  gabapentin (NEURONTIN) 100 MG capsule Take 100 mg by mouth at bedtime. 06/08/20   [provider]  LINZESS 72 MCG capsule TAKE 1 CAPSULE BY MOUTH DAILY BEFORE BREAKFAST. 07/08/21   Milus Banister, MD  omeprazole (PRILOSEC) 40 MG capsule Take 1 capsule (40 mg total) by mouth daily. 11/21/20   Milus Banister, MD      Allergies    Patient has no known allergies.    Review of Systems   Review of Systems  Physical Exam Updated Vital Signs BP 125/80   Pulse 89   Temp 98.3 F (36.8 C) (Oral)   Resp 18   Ht '5\' 4"'$  (1.626 m)   Wt 70.3 kg   SpO2 97%   BMI 26.61 kg/m  Physical Exam Vitals and nursing note reviewed.  Constitutional:      Appearance: She is well-developed.  HENT:     Head: Normocephalic and atraumatic.     Mouth/Throat:     Mouth:  Mucous membranes are moist.  Eyes:     Pupils: Pupils are equal, round, and reactive to light.  Cardiovascular:     Rate and Rhythm: Normal rate and regular rhythm.  Pulmonary:     Effort: No respiratory distress.     Breath sounds: No stridor.  Abdominal:     General: Abdomen is flat. There is no distension.     Tenderness: There is abdominal tenderness (diffuse but markedly worse in RLQ).  Musculoskeletal:        General: No swelling or tenderness. Normal range of motion.     Cervical back: Normal range of motion.  Skin:    General: Skin is warm and dry.  Neurological:     General: No focal deficit present.     Mental Status: She is alert.     ED Results / Procedures / Treatments   Labs (all labs ordered are listed, but only abnormal results are displayed) Labs Reviewed  URINALYSIS, ROUTINE W REFLEX MICROSCOPIC - Abnormal; Notable for the following components:      Result Value   APPearance CLOUDY (*)    Hgb urine dipstick MODERATE (*)    Ketones, ur 80 (*)    Protein, ur 100 (*)  Nitrite POSITIVE (*)    Leukocytes,Ua LARGE (*)    All other components within normal limits  CBC WITH DIFFERENTIAL/PLATELET - Abnormal; Notable for the following components:   WBC 11.6 (*)    Neutro Abs 9.7 (*)    All other components within normal limits  URINALYSIS, MICROSCOPIC (REFLEX) - Abnormal; Notable for the following components:   Bacteria, UA MANY (*)    All other components within normal limits  URINE CULTURE  CULTURE, BLOOD (ROUTINE X 2)  CULTURE, BLOOD (ROUTINE X 2)  COMPREHENSIVE METABOLIC PANEL  LIPASE, BLOOD    EKG None  Radiology No results found.  Procedures Procedures    Medications Ordered in ED Medications  cefTRIAXone (ROCEPHIN) 1 g in sodium chloride 0.9 % 100 mL IVPB (has no administration in time range)  lactated ringers bolus 1,000 mL (1,000 mLs Intravenous New Bag/Given 11/12/21 0636)  fentaNYL (SUBLIMAZE) injection 100 mcg (100 mcg Intravenous  Given 11/12/21 0637)  ondansetron (ZOFRAN) injection 4 mg (4 mg Intravenous Given 11/12/21 4010)    ED Course/ Medical Decision Making/ A&P                           Medical Decision Making Amount and/or Complexity of Data Reviewed Labs: ordered. Radiology: ordered.  Risk Prescription drug management.   Appendicitis vs pyelonephritis vs simple cystitis. Will check labs, urine CT. Symptomatic treatment provided.   Urine does look infected. Rocephin ordered. Already receiving fluids. Will add on lactic. Ct and rest of labs pending at time of transfer of care.   Final Clinical Impression(s) / ED Diagnoses Final diagnoses:  None    Rx / DC Orders ED Discharge Orders     None         Ezella Kell, Corene Cornea, MD 11/12/21 [phone]

## 2021-11-12 NOTE — Assessment & Plan Note (Signed)
-   Continue bupropion and escitalopram

## 2021-11-12 NOTE — Progress Notes (Signed)
PHARMACY - PHYSICIAN COMMUNICATION CRITICAL VALUE ALERT - BLOOD CULTURE IDENTIFICATION (BCID)  Lindsay Hamilton is an 56 y.o. female who presented to [facility] on 11/12/2021 with a chief complaint of right flank and RLQ pain, fever, chills, emesis for few days  Assessment:  Ecoli, 4/4 no resistance  Name of physician (or Provider) Contacted: Clarene Essex  Current antibiotics: CTX 2gm IV q24h  Changes to prescribed antibiotics recommended:  Patient is on recommended antibiotics - No changes needed  Results for orders placed or performed during the hospital encounter of 11/12/21  Blood Culture ID Panel (Reflexed) (Collected: 11/12/2021  8:00 AM)  Result Value Ref Range   Enterococcus faecalis NOT DETECTED NOT DETECTED   Enterococcus Faecium NOT DETECTED NOT DETECTED   Listeria monocytogenes NOT DETECTED NOT DETECTED   Staphylococcus species NOT DETECTED NOT DETECTED   Staphylococcus aureus (BCID) NOT DETECTED NOT DETECTED   Staphylococcus epidermidis NOT DETECTED NOT DETECTED   Staphylococcus lugdunensis NOT DETECTED NOT DETECTED   Streptococcus species NOT DETECTED NOT DETECTED   Streptococcus agalactiae NOT DETECTED NOT DETECTED   Streptococcus pneumoniae NOT DETECTED NOT DETECTED   Streptococcus pyogenes NOT DETECTED NOT DETECTED   A.calcoaceticus-baumannii NOT DETECTED NOT DETECTED   Bacteroides fragilis NOT DETECTED NOT DETECTED   Enterobacterales DETECTED (A) NOT DETECTED   Enterobacter cloacae complex NOT DETECTED NOT DETECTED   Escherichia coli DETECTED (A) NOT DETECTED   Klebsiella aerogenes NOT DETECTED NOT DETECTED   Klebsiella oxytoca NOT DETECTED NOT DETECTED   Klebsiella pneumoniae NOT DETECTED NOT DETECTED   Proteus species NOT DETECTED NOT DETECTED   Salmonella species NOT DETECTED NOT DETECTED   Serratia marcescens NOT DETECTED NOT DETECTED   Haemophilus influenzae NOT DETECTED NOT DETECTED   Neisseria meningitidis NOT DETECTED NOT DETECTED   Pseudomonas  aeruginosa NOT DETECTED NOT DETECTED   Stenotrophomonas maltophilia NOT DETECTED NOT DETECTED   Candida albicans NOT DETECTED NOT DETECTED   Candida auris NOT DETECTED NOT DETECTED   Candida glabrata NOT DETECTED NOT DETECTED   Candida krusei NOT DETECTED NOT DETECTED   Candida parapsilosis NOT DETECTED NOT DETECTED   Candida tropicalis NOT DETECTED NOT DETECTED   Cryptococcus neoformans/gattii NOT DETECTED NOT DETECTED   CTX-M ESBL NOT DETECTED NOT DETECTED   Carbapenem resistance IMP NOT DETECTED NOT DETECTED   Carbapenem resistance KPC NOT DETECTED NOT DETECTED   Carbapenem resistance NDM NOT DETECTED NOT DETECTED   Carbapenem resist OXA 48 LIKE NOT DETECTED NOT DETECTED   Carbapenem resistance VIM NOT DETECTED NOT DETECTED   Dolly Rias RPh 11/12/2021, 11:48 PM

## 2021-11-12 NOTE — ED Notes (Signed)
[facility] into transport to [facility]. Report given to floor nurse

## 2021-11-12 NOTE — ED Notes (Signed)
ED TO INPATIENT HANDOFF REPORT  ED Nurse Name and Phone #: Angelina Pih RN  S Name/Age/Gender Lindsay Hamilton 56 y.o. female Room/Bed: MH07/MH07  Code Status   Code Status: Not on file  Home/SNF/Other Home Patient oriented to: self, place, time, and situation Is this baseline? Yes   Triage Complete: Triage complete  Chief Complaint Severe sepsis ([facility]) [A41.9, R65.20]  Triage Note Pt with lower back pain, dysuria, fever, chills and vomiting   Allergies No Known Allergies  Level of Care/Admitting Diagnosis ED Disposition     ED Disposition  Admit   Condition  --   Comment  Hospital Area: [facility] [[id number]]  Level of Care: Progressive [102]  Admit to Progressive based on following criteria: CARDIOVASCULAR & THORACIC of moderate stability with acute coronary syndrome symptoms/low risk myocardial infarction/hypertensive urgency/arrhythmias/heart failure potentially compromising stability and stable post cardiovascular intervention patients.  May [facility] to [facility] or [facility] if equivalent level of care is available:: Yes  Interfacility transfer: Yes  Covid Evaluation: Confirmed COVID Negative  Diagnosis: Severe sepsis [facility]) [[id number]]  Admitting Physician: Mercy Riding [[id number]]  Attending Physician: Mercy Riding [[id number]]  Certification:: I certify this patient will need inpatient services for at least 2 midnights  Estimated Length of Stay: 2          B Medical/Surgery History Past Medical History:  Diagnosis Date   Alcohol abuse, in remission    Depression    Head injury    Headaches due to old head injury    Ingrowing nail 02/20/2020   Insomnia 09/14/2013   Migraine without aura, without mention of intractable migraine without mention of status migrainosus 09/14/2013   Obese    Pancreatitis    Pseudoseizures    Right upper quadrant pain 06/26/2020   Tubular adenoma of colon 2018   Past Surgical History:   Procedure Laterality Date   ABDOMINAL HYSTERECTOMY  2009   Partial   CARPAL Petros     x2-1992, 1997   CHOLECYSTECTOMY  2002   COLONOSCOPY W/ BIOPSIES  05/2016   tubular adenoma   GREAT TOE ARTHRODESIS, METATARSALPHALANGEAL JOINT Right 07/17/2018   KNEE ARTHROSCOPY     bilateral, 3 right and 3 left   SHOULDER ARTHROSCOPY Bilateral    Right 2012, left 2014     A IV Location/Drains/Wounds Patient Lines/Drains/Airways Status     Active Line/Drains/Airways     Name Placement date Placement time Site Days   Peripheral IV 11/12/21 20 G Right Antecubital 11/12/21  0631  Antecubital  less than 1   Peripheral IV 11/12/21 20 G Left Antecubital 11/12/21  0800  Antecubital  less than 1            Intake/Output Last 24 hours  Intake/Output Summary (Last 24 hours) at 11/12/2021 1520 Last data filed at 11/12/2021 5537 Gross per 24 hour  Intake 951.22 ml  Output --  Net 951.22 ml    Labs/Imaging Results for orders placed or performed during the hospital encounter of 11/12/21 (from the past 48 hour(s))  Urinalysis, Routine w reflex microscopic Urine, Clean Catch     Status: Abnormal   Collection Time: 11/12/21  6:17 AM  Result Value Ref Range   Color, Urine YELLOW YELLOW   APPearance CLOUDY (A) CLEAR   Specific Gravity, Urine 1.020 1.005 - 1.030   pH 5.5 5.0 - 8.0   Glucose, UA NEGATIVE NEGATIVE mg/dL   Hgb urine  dipstick MODERATE (A) NEGATIVE   Bilirubin Urine NEGATIVE NEGATIVE   Ketones, ur 80 (A) NEGATIVE mg/dL   Protein, ur 100 (A) NEGATIVE mg/dL   Nitrite POSITIVE (A) NEGATIVE   Leukocytes,Ua LARGE (A) NEGATIVE    Comment: Performed at [facility], [address] [place], [place] [address]  Urinalysis, Microscopic (reflex)     Status: Abnormal   Collection Time: 11/12/21  6:17 AM  Result Value Ref Range   RBC / HPF 11-20 0 - 5 RBC/hpf   WBC, UA >50 0 - 5 WBC/hpf   Bacteria, UA MANY (A) NONE SEEN   Squamous Epithelial /  LPF 0-5 0 - 5   Mucus PRESENT    Granular Casts, UA PRESENT     Comment: Performed at [facility], [address] [place], [place] [address]  CBC with Differential     Status: Abnormal   Collection Time: 11/12/21  6:39 AM  Result Value Ref Range   WBC 11.6 (H) 4.0 - 10.5 K/uL   RBC 4.37 3.87 - 5.11 MIL/uL   Hemoglobin 12.3 12.0 - 15.0 g/dL   HCT 37.8 36.0 - 46.0 %   MCV 86.5 80.0 - 100.0 fL   MCH 28.1 26.0 - 34.0 pg   MCHC 32.5 30.0 - 36.0 g/dL   RDW 13.4 11.5 - 15.5 %   Platelets 280 150 - 400 K/uL   nRBC 0.0 0.0 - 0.2 %   Neutrophils Relative % 84 %   Neutro Abs 9.7 (H) 1.7 - 7.7 K/uL   Lymphocytes Relative 9 %   Lymphs Abs 1.0 0.7 - 4.0 K/uL   Monocytes Relative 7 %   Monocytes Absolute 0.8 0.1 - 1.0 K/uL   Eosinophils Relative 0 %   Eosinophils Absolute 0.0 0.0 - 0.5 K/uL   Basophils Relative 0 %   Basophils Absolute 0.0 0.0 - 0.1 K/uL   Immature Granulocytes 0 %   Abs Immature Granulocytes 0.03 0.00 - 0.07 K/uL    Comment: Performed at [facility], [address] [place], [place] [address]  Comprehensive metabolic panel     Status: Abnormal   Collection Time: 11/12/21  6:39 AM  Result Value Ref Range   Sodium 134 (L) 135 - 145 mmol/L   Potassium 3.6 3.5 - 5.1 mmol/L   Chloride 101 98 - 111 mmol/L   CO2 23 22 - 32 mmol/L   Glucose, Bld 117 (H) 70 - 99 mg/dL    Comment: Glucose reference range applies only to samples taken after fasting for at least 8 hours.   BUN 9 6 - 20 mg/dL   Creatinine, Ser 0.83 0.44 - 1.00 mg/dL   Calcium 8.9 8.9 - 10.3 mg/dL   Total Protein 7.6 6.5 - 8.1 g/dL   Albumin 3.5 3.5 - 5.0 g/dL   AST 48 (H) 15 - 41 U/L   ALT 83 (H) 0 - 44 U/L   Alkaline Phosphatase 85 38 - 126 U/L   Total Bilirubin 1.5 (H) 0.3 - 1.2 mg/dL   GFR, Estimated >60 >60 mL/min    Comment: (NOTE) Calculated using the CKD-EPI Creatinine Equation (2021)    Anion gap 10 5 - 15    Comment: Performed at [facility], [address] [place], [place] [address]  Lipase, blood     Status: None   Collection Time: 11/12/21  6:39 AM  Result Value Ref Range   Lipase 29 11 -  51 U/L    Comment: Performed at [facility], [address] [place], [place] [address]  Lactic acid, plasma     Status: None   Collection Time: 11/12/21  7:50 AM  Result Value Ref Range   Lactic Acid, Venous 0.9 0.5 - 1.9 mmol/L    Comment: Performed at [facility], [address] [place], [place] [address]   CT ABDOMEN PELVIS W CONTRAST  Result Date: 11/12/2021 CLINICAL DATA:  Abdominal pain EXAM: CT ABDOMEN AND PELVIS WITH CONTRAST TECHNIQUE: Multidetector CT imaging of the abdomen and pelvis was performed using the standard protocol following bolus administration of intravenous contrast. RADIATION DOSE REDUCTION: This exam was performed according to the departmental dose-optimization program which includes automated exposure control, adjustment of the mA and/or kV according to patient size and/or use of iterative reconstruction technique. CONTRAST:  161m OMNIPAQUE IOHEXOL 300 MG/ML  SOLN COMPARISON:  CT abdomen and pelvis dated July 29, 2020 FINDINGS: Lower chest: Small hiatal hernia.  No acute abnormality. Hepatobiliary: No focal liver abnormality is seen. Status post cholecystectomy. No biliary dilatation. Pancreas: Unremarkable. No pancreatic ductal dilatation or surrounding inflammatory changes. Spleen: Normal in size without focal abnormality. Adrenals/Urinary Tract: Bilateral adrenal glands are unremarkable. No hydronephrosis, nephrolithiasis or suspicious renal lesions. Heterogeneous enhancement of the bilateral kidneys is seen on delayed imaging. Mild bilateral urothelial enhancement. Bladder wall thickening with perivesicular fat stranding. Stomach/Bowel: Stomach is within normal limits. Appendix appears normal. No evidence of bowel wall thickening, distention, or inflammatory changes. Vascular/Lymphatic: Aortic atherosclerosis.  No enlarged abdominal or pelvic lymph nodes. Reproductive: Status post hysterectomy. No adnexal masses. Other: No abdominal wall hernia or abnormality. No abdominopelvic ascites. Musculoskeletal: No acute or significant osseous findings. IMPRESSION: 1. Heterogeneous enhancement of the bilateral kidneys on delayed imaging, mild bilateral urothelial enhancement and bladder wall thickening, findings can be seen in the setting of infection. Recommend correlation with urinalysis. 2.  Aortic Atherosclerosis (ICD10-I70.0). Electronically Signed   By: LYetta GlassmanM.D.   On: 11/12/2021 07:57    Pending Labs Unresulted Labs (From admission, onward)     Start     Ordered   11/12/21 0717  Blood culture (routine x 2)  BLOOD CULTURE X 2,   STAT      11/12/21 0716   11/12/21 0632  Urine Culture  Once,   URGENT       Question:  Indication  Answer:  Dysuria   11/12/21 0632            Vitals/Pain Today's Vitals   11/12/21 1400 11/12/21 1430 11/12/21 1500 11/12/21 1515  BP: '91/62 95/65 92/61 '$   Pulse: 72 73 69   Resp:  14 14   Temp:      TempSrc:      SpO2: 96% 93% 93%   Weight:      Height:      PainSc:    5     Isolation Precautions No active isolations  Medications Medications  lactated ringers bolus 1,000 mL (0 mLs Intravenous Stopped 11/12/21 0800)  fentaNYL (SUBLIMAZE) injection 100 mcg (100 mcg Intravenous Given 11/12/21 0637)  ondansetron (ZOFRAN) injection 4 mg (4 mg Intravenous Given 11/12/21 0634)  cefTRIAXone (ROCEPHIN) 1 g in sodium chloride 0.9 % 100 mL IVPB (0 g Intravenous Stopped 11/12/21 0939)  iohexol (OMNIPAQUE) 300 MG/ML solution 100 mL (100 mLs Intravenous Contrast Given 11/12/21 0730)  lactated ringers bolus 1,000 mL (0 mLs Intravenous Stopped 11/12/21 0939)  ketorolac (TORADOL) 15 MG/ML  injection 30 mg (30 mg Intravenous Given 11/12/21 0955)  acetaminophen (TYLENOL) tablet 1,000 mg (1,000 mg Oral Given 11/12/21 1333)  ondansetron (ZOFRAN) injection 4 mg (4 mg Intravenous  Given 11/12/21 1002)  lactated ringers bolus 1,000 mL (1,000 mLs Intravenous New Bag/Given 11/12/21 1333)  lactated ringers bolus 1,000 mL (0 mLs Intravenous Stopped 11/12/21 1515)    Mobility walks Low fall risk   Focused Assessments    R Recommendations: See Admitting Provider Note  Report given to:   Additional Notes:  Pt is on her 4th liter of LR

## 2021-11-12 NOTE — ED Notes (Signed)
Pt given crackers and soda to see if she can tolerate po

## 2021-11-12 NOTE — Hospital Course (Signed)
Lindsay Hamilton is a 56 y.o. F with no significant PMHx who presented with a few days of lower abdominal discomfort, urinary urgency, and dysuria as well as nausea and vomiting.  In the ER, urinalysis showed pyuria, bacteria.  CT of the abdomen and pelvis with contrast showed bilateral pyelonephritis and stranding, no evidence of hydronephrosis or kidney stone.  She was given Rocephin, 3 L lactic Ringer's, and transferred to progressive care at the hospital for ongoing treatment of sepsis.

## 2021-11-12 NOTE — ED Notes (Signed)
EDP at bedside  

## 2021-11-12 NOTE — ED Notes (Signed)
[facility] into transport 

## 2021-11-12 NOTE — H&P (Signed)
History and Physical    Patient: Lindsay Hamilton WER:[id number] DOB: [birth year] DOA: 11/12/2021 DOS: the patient was seen and examined on 11/12/2021 PCP: [name], DO  Patient coming from: Home  Chief Complaint:  Chief Complaint  Patient presents with   Urinary Tract Infection       HPI:  Mrs. Guillet is a 56 y.o. F with no significant PMHx who presented with a few days of lower abdominal discomfort, urinary urgency, and dysuria as well as nausea and vomiting.  In the ER, urinalysis showed pyuria, bacteria.  CT of the abdomen and pelvis with contrast showed bilateral pyelonephritis and stranding, no evidence of hydronephrosis or kidney stone.  She was given Rocephin, 3 L lactic Ringer's, and transferred to progressive care at the hospital for ongoing treatment of sepsis.      Review of Systems  Constitutional:  Positive for chills and malaise/fatigue.  Genitourinary:  Positive for dysuria, flank pain, frequency and urgency. Negative for hematuria.  Musculoskeletal:  Positive for back pain.  Neurological:  Negative for dizziness.       No confusion  All other systems reviewed and are negative.    Past Medical History:  Diagnosis Date   Alcohol abuse, in remission    Depression    Head injury    Headaches due to old head injury    Ingrowing nail 02/20/2020   Insomnia 09/14/2013   Migraine without aura, without mention of intractable migraine without mention of status migrainosus 09/14/2013   Obese    Pancreatitis    Pseudoseizures    Right upper quadrant pain 06/26/2020   Tubular adenoma of colon 2018   Past Surgical History:  Procedure Laterality Date   ABDOMINAL HYSTERECTOMY  2009   Partial   CARPAL [place]     x2-1992, 1997   CHOLECYSTECTOMY  2002   COLONOSCOPY W/ BIOPSIES  05/2016   tubular adenoma   GREAT TOE ARTHRODESIS, METATARSALPHALANGEAL JOINT Right 07/17/2018   KNEE ARTHROSCOPY     bilateral, 3 right and 3  left   SHOULDER ARTHROSCOPY Bilateral    Right 2012, left 2014   Social History:  reports that she quit smoking about 26 years ago. Her smoking use included cigarettes. She has never used smokeless tobacco. She reports current alcohol use. She reports that she does not use drugs.  No Known Allergies  Family History  Problem Relation Age of Onset   Hypertension Mother    CAD Mother        Died of MI at age 107   Asthma Mother    Pulmonary embolism Mother        x2   Cancer - Prostate Father    Atrial fibrillation Father    Hyperlipidemia Father    Seizures Sister    Migraines Sister    Hypertension Brother    Hypertension Brother    Lung cancer Paternal Grandmother    Breast cancer Neg Hx    Allergic rhinitis Neg Hx    Eczema Neg Hx    Urticaria Neg Hx     Prior to Admission medications   Medication Sig Start Date End Date Taking? Authorizing Provider  cefpodoxime (VANTIN) 200 MG tablet Take 1 tablet (200 mg total) by mouth 2 (two) times daily for 10 days. 11/12/21 11/22/21 Yes Gareth Morgan, MD  ondansetron (ZOFRAN-ODT) 4 MG disintegrating tablet Take 1 tablet (4 mg total) by mouth every 8 (eight) hours as needed for nausea  or vomiting. 11/12/21  Yes Gareth Morgan, MD  oxyCODONE (ROXICODONE) 5 MG immediate release tablet Take 1 tablet (5 mg total) by mouth every 4 (four) hours as needed for severe pain. 11/12/21  Yes Gareth Morgan, MD  buPROPion (WELLBUTRIN XL) 150 MG 24 hr tablet Take 1 tablet (150 mg total) by mouth daily. 08/19/21   Kuneff, Renee A, DO  celecoxib (CELEBREX) 200 MG capsule Take 200 mg by mouth 2 (two) times daily. 06/12/20   [provider]  clonazePAM (KLONOPIN) 0.5 MG tablet 1/2 tab during the day and 1-2 tabs QHS and 08/19/21   Kuneff, Renee A, DO  escitalopram (LEXAPRO) 20 MG tablet Take 1 tablet (20 mg total) by mouth at bedtime. 08/19/21   Kuneff, Renee A, DO  gabapentin (NEURONTIN) 100 MG capsule Take 100 mg by mouth at bedtime. 06/08/20    [provider]  LINZESS 72 MCG capsule TAKE 1 CAPSULE BY MOUTH DAILY BEFORE BREAKFAST. 07/08/21   Milus Banister, MD  omeprazole (PRILOSEC) 40 MG capsule Take 1 capsule (40 mg total) by mouth daily. 11/21/20   Milus Banister, MD    Physical Exam: Vitals:   11/12/21 1530 11/12/21 1600 11/12/21 1611 11/12/21 1745  BP: (!) 82/58 (!) 87/62 (!) 90/58 95/62  Pulse: 68 69 70 61  Resp:   14 18  Temp:   98.1 F (36.7 C) 98.1 F (36.7 C)  TempSrc:    Oral  SpO2: 95% 96% 96% 96%  Weight:      Height:       Adult female, lying in bed, flushed, no acute distress, interactive and appropriate Heart regular, no murmurs, no peripheral edema, JVP normal Respiratory rate normal, lungs clear without rales or wheezes Abdomen soft without tenderness to palpation or guarding, bilateral CVA tenderness noted Attention normal, affect appropriate, judgment and insight appear normal, oriented to person, place, time, and situation, face symmetric, speech fluent, moves all extremities with normal strength and coordination    Data Reviewed: Basic metabolic panel is notable for mild hyponatremia LFTs notable for mild transaminitis Hemogram notable for wBC 11.6, hemoglobin and platelets normal Urinalysis showed bacteria and pyuria Lactate normal CT of the abdomen and pelvis after the administration of IV contrast showed bilateral pyelonephritis without hydronephrosis    Assessment and Plan: * Severe sepsis ([place]) Presented with fever, WBC 11.6K, tachypnea >20, and hypotension.  Urine source.  CT shows no hydronephrosis, no history of stones. Does show bilateral pyelonephritis, suspect GNR bacteremia.   Baseline BP 120/80 - Start Rocephin - Continue IV fluids - Follow urine and blood cultures - If BP lower overnight, low thershold to transfer to ICU for pressors  Hyponatremia Mild - Continue IV fluids and trend  Transaminitis Unclear cause - Trend LFTs  Depression with anxiety -  Continue bupropion and escitalopram         Advance Care Planning: FULL, presumed  Consults: None   Severity of Illness: The appropriate patient status for this patient is INPATIENT. Inpatient status is judged to be reasonable and necessary in order to provide the required intensity of service to ensure the patient's safety. The patient's presenting symptoms, physical exam findings, and initial radiographic and laboratory data in the context of their chronic comorbidities is felt to place them at high risk for further clinical deterioration. Furthermore, it is not anticipated that the patient will be medically stable for discharge from the hospital within 2 midnights of admission.   * I certify that at the point  of admission it is my clinical judgment that the patient will require inpatient hospital care spanning beyond 2 midnights from the point of admission due to high intensity of service, high risk for further deterioration and high frequency of surveillance required.*  Author: Edwin Dada, MD 11/12/2021 6:05 PM  For on call review [email or web address].

## 2021-11-12 NOTE — Plan of Care (Signed)
  Problem: Education: Goal: Knowledge of General Education information will improve Description: Including pain rating scale, medication(s)/side effects and non-pharmacologic comfort measures Outcome: Progressing   Problem: Safety: Goal: Ability to remain free from injury will improve Outcome: Progressing   

## 2021-11-13 DIAGNOSIS — E876 Hypokalemia: Secondary | ICD-10-CM | POA: Diagnosis not present

## 2021-11-13 DIAGNOSIS — A419 Sepsis, unspecified organism: Secondary | ICD-10-CM | POA: Diagnosis not present

## 2021-11-13 DIAGNOSIS — E871 Hypo-osmolality and hyponatremia: Secondary | ICD-10-CM | POA: Diagnosis not present

## 2021-11-13 DIAGNOSIS — F418 Other specified anxiety disorders: Secondary | ICD-10-CM | POA: Diagnosis not present

## 2021-11-13 HISTORY — DX: Hypokalemia: E87.6

## 2021-11-13 LAB — CBC
HCT: 34.9 % — ABNORMAL LOW (ref 36.0–46.0)
Hemoglobin: 11.1 g/dL — ABNORMAL LOW (ref 12.0–15.0)
MCH: 28.5 pg (ref 26.0–34.0)
MCHC: 31.8 g/dL (ref 30.0–36.0)
MCV: 89.7 fL (ref 80.0–100.0)
Platelets: 202 10*3/uL (ref 150–400)
RBC: 3.89 MIL/uL (ref 3.87–5.11)
RDW: 13.7 % (ref 11.5–15.5)
WBC: 12.2 10*3/uL — ABNORMAL HIGH (ref 4.0–10.5)
nRBC: 0 % (ref 0.0–0.2)

## 2021-11-13 LAB — COMPREHENSIVE METABOLIC PANEL
ALT: 80 U/L — ABNORMAL HIGH (ref 0–44)
AST: 52 U/L — ABNORMAL HIGH (ref 15–41)
Albumin: 2.9 g/dL — ABNORMAL LOW (ref 3.5–5.0)
Alkaline Phosphatase: 107 U/L (ref 38–126)
Anion gap: 8 (ref 5–15)
BUN: 6 mg/dL (ref 6–20)
CO2: 25 mmol/L (ref 22–32)
Calcium: 8.5 mg/dL — ABNORMAL LOW (ref 8.9–10.3)
Chloride: 111 mmol/L (ref 98–111)
Creatinine, Ser: 0.77 mg/dL (ref 0.44–1.00)
GFR, Estimated: >60 mL/min (ref >60)
Glucose, Bld: 100 mg/dL — ABNORMAL HIGH (ref 70–99)
Potassium: 3.3 mmol/L — ABNORMAL LOW (ref 3.5–5.1)
Sodium: 144 mmol/L (ref 135–145)
Total Bilirubin: 1.4 mg/dL — ABNORMAL HIGH (ref 0.3–1.2)
Total Protein: 6.3 g/dL — ABNORMAL LOW (ref 6.5–8.1)

## 2021-11-13 LAB — HIV ANTIBODY (ROUTINE TESTING W REFLEX): HIV Screen 4th Generation wRfx: NONREACTIVE

## 2021-11-13 MED ORDER — POTASSIUM CHLORIDE 20 MEQ PO PACK
60.0000 meq | PACK | Freq: Once | ORAL | Status: AC
  Administered 2021-11-13: 60 meq via ORAL
  Filled 2021-11-13: qty 3

## 2021-11-13 MED ORDER — MELATONIN 3 MG PO TABS
3.0000 mg | ORAL_TABLET | Freq: Once | ORAL | Status: AC
  Administered 2021-11-13: 3 mg via ORAL
  Filled 2021-11-13: qty 1

## 2021-11-13 MED ORDER — PROCHLORPERAZINE EDISYLATE 10 MG/2ML IJ SOLN
5.0000 mg | Freq: Four times a day (QID) | INTRAMUSCULAR | Status: AC | PRN
  Administered 2021-11-13 – 2021-11-14 (×2): 5 mg via INTRAVENOUS
  Filled 2021-11-13 (×2): qty 2

## 2021-11-13 MED ORDER — ONDANSETRON HCL 4 MG/2ML IJ SOLN
4.0000 mg | Freq: Once | INTRAMUSCULAR | Status: AC
Start: 2021-11-13 — End: 2021-11-13
  Administered 2021-11-13: 4 mg via INTRAVENOUS
  Filled 2021-11-13: qty 2

## 2021-11-13 MED ORDER — SODIUM CHLORIDE 0.9 % IV SOLN: INTRAVENOUS | Status: DC | PRN

## 2021-11-13 MED ORDER — SODIUM CHLORIDE 0.9 % IV BOLUS
500.0000 mL | Freq: Once | INTRAVENOUS | Status: AC
Start: 2021-11-13 — End: 2021-11-13
  Administered 2021-11-13: 500 mL via INTRAVENOUS

## 2021-11-13 MED ORDER — PROCHLORPERAZINE EDISYLATE 10 MG/2ML IJ SOLN
5.0000 mg | Freq: Once | INTRAMUSCULAR | Status: AC
  Administered 2021-11-13: 5 mg via INTRAVENOUS
  Filled 2021-11-13: qty 2

## 2021-11-13 NOTE — Assessment & Plan Note (Signed)
Treated and resolved 

## 2021-11-13 NOTE — Progress Notes (Signed)
  Transition of Care [facility]) Screening Note   Patient Details  Name: Lindsay Hamilton Date of Birth: [birth year]   [facility] CM/SW Contact:    Lennart Pall, LCSW Phone Number: 11/13/2021, 2:10 PM    Transition of Care Department [facility]) has reviewed patient and no TOC needs have been identified at this time. We will continue to monitor patient advancement through interdisciplinary progression rounds. If new patient transition needs arise, please place a TOC consult.

## 2021-11-13 NOTE — Progress Notes (Signed)
  Progress Note   Patient: Lindsay Hamilton UGQ:[id number] DOB: [birth year] DOA: 11/12/2021     1 DOS: the patient was seen and examined on 11/13/2021 at 12:10 PM      Brief hospital course: Mrs. Cech is a 56 y.o. F with no significant PMHx who presented with a few days of lower abdominal discomfort, urinary urgency, and dysuria as well as nausea and vomiting.  In the ER, urinalysis showed pyuria, bacteria.  CT of the abdomen and pelvis with contrast showed bilateral pyelonephritis and stranding, no evidence of hydronephrosis or kidney stone.  She was given Rocephin, 3 L lactic Ringer's, and transferred to progressive care at the hospital for ongoing treatment of sepsis.     Assessment and Plan: * Severe sepsis ([place]) Presented with fever, WBC 11.6K, tachypnea >20, and hypotension.  Urine source.  CT shows no hydronephrosis, no history of stones.  Transferred to stepdown last night, but is stabilized and improving, blood pressure now close to baseline Blood cultures now growing gram-negative rods in 2 of 2. - Continue Rocephin, day 2 of 7 - Follow urine culture for sensitivities   Hypokalemia - Supplement potassium  Hyponatremia Resolved with fluids  Transaminitis Unclear cause, suspect fatty liver versus recurrence of alcohol use - Recommend outpatient follow-up with PCP  Depression with anxiety - Continue bupropion and escitalopram          Subjective: Patient feeling better, she has mild headache, mild lower back pain, no confusion, no respiratory distress, no dizziness, no malaise.     Physical Exam: Vitals:   11/13/21 0800 11/13/21 0900 11/13/21 0935 11/13/21 1200  BP: 119/69 110/79    Pulse: 72 68 80   Resp: '15 19 14   '$ Temp: 98.4 F (36.9 C)   98.1 F (36.7 C)  TempSrc: Oral   Oral  SpO2: 94% 99% 94%   Weight:      Height:       Adult female, lying in bed, no acute distress RRR, no murmurs, no peripheral edema Respiratory rate normal, lungs  clear without rales or wheezes Abdomen soft with mild tenderness to deep palpation on either side, no suprapubic pain, no rigidity, no rebound, no masses Skin is without jaundice, no stigmata of chronic liver disease Attention normal, affect appropriate, judgment and insight appear normal  Data Reviewed: Basic metabolic panel notable for mild hypokalemia Blood cultures positive for E. coli Hemogram notable for mild anemia, increased white blood cell count  Family Communication: Husband at the bedside    Disposition: Status is: Inpatient The patient was admitted for severe sepsis from E. coli bacteremia due to UTI.  This is improving, she is stabilized.  If she continues to defervesce, will likely transition to orals and discharge soon        Author: Edwin Dada, MD 11/13/2021 1:07 PM  For on call review [email or web address].

## 2021-11-14 ENCOUNTER — Other Ambulatory Visit (HOSPITAL_COMMUNITY): Payer: Self-pay

## 2021-11-14 DIAGNOSIS — A419 Sepsis, unspecified organism: Secondary | ICD-10-CM | POA: Diagnosis not present

## 2021-11-14 DIAGNOSIS — D649 Anemia, unspecified: Secondary | ICD-10-CM

## 2021-11-14 DIAGNOSIS — F418 Other specified anxiety disorders: Secondary | ICD-10-CM | POA: Diagnosis not present

## 2021-11-14 DIAGNOSIS — E876 Hypokalemia: Secondary | ICD-10-CM | POA: Diagnosis not present

## 2021-11-14 DIAGNOSIS — E871 Hypo-osmolality and hyponatremia: Secondary | ICD-10-CM | POA: Diagnosis not present

## 2021-11-14 HISTORY — DX: Anemia, unspecified: D64.9

## 2021-11-14 LAB — CULTURE, BLOOD (ROUTINE X 2)
Special Requests: ADEQUATE
Special Requests: ADEQUATE

## 2021-11-14 LAB — COMPREHENSIVE METABOLIC PANEL WITH GFR
ALT: 73 U/L — ABNORMAL HIGH (ref 0–44)
AST: 43 U/L — ABNORMAL HIGH (ref 15–41)
Albumin: 2.9 g/dL — ABNORMAL LOW (ref 3.5–5.0)
Alkaline Phosphatase: 102 U/L (ref 38–126)
Anion gap: 8 (ref 5–15)
BUN: 5 mg/dL — ABNORMAL LOW (ref 6–20)
CO2: 23 mmol/L (ref 22–32)
Calcium: 8.5 mg/dL — ABNORMAL LOW (ref 8.9–10.3)
Chloride: 111 mmol/L (ref 98–111)
Creatinine, Ser: 0.61 mg/dL (ref 0.44–1.00)
GFR, Estimated: >60 mL/min (ref >60)
Glucose, Bld: 95 mg/dL (ref 70–99)
Potassium: 3.4 mmol/L — ABNORMAL LOW (ref 3.5–5.1)
Sodium: 142 mmol/L (ref 135–145)
Total Bilirubin: 0.7 mg/dL (ref 0.3–1.2)
Total Protein: 6.1 g/dL — ABNORMAL LOW (ref 6.5–8.1)

## 2021-11-14 LAB — CBC
HCT: 31.7 % — ABNORMAL LOW (ref 36.0–46.0)
Hemoglobin: 10 g/dL — ABNORMAL LOW (ref 12.0–15.0)
MCH: 28.5 pg (ref 26.0–34.0)
MCHC: 31.5 g/dL (ref 30.0–36.0)
MCV: 90.3 fL (ref 80.0–100.0)
Platelets: 226 K/uL (ref 150–400)
RBC: 3.51 MIL/uL — ABNORMAL LOW (ref 3.87–5.11)
RDW: 13.9 % (ref 11.5–15.5)
WBC: 8.2 K/uL (ref 4.0–10.5)
nRBC: 0 % (ref 0.0–0.2)

## 2021-11-14 LAB — URINE CULTURE: Culture: >=100000 — AB

## 2021-11-14 MED ORDER — CIPROFLOXACIN HCL 750 MG PO TABS
750.0000 mg | ORAL_TABLET | Freq: Two times a day (BID) | ORAL | 0 refills | Status: DC
  Filled 2021-11-14: qty 8, 4d supply, fill #0

## 2021-11-14 MED ORDER — POTASSIUM CHLORIDE 20 MEQ PO PACK: 60.0000 meq | PACK | Freq: Once | ORAL | Status: DC

## 2021-11-14 NOTE — Discharge Summary (Signed)
Physician Discharge Summary   Patient: Lindsay Hamilton MRN: [id number] DOB: February 13, 1966  Admit date:     11/12/2021  Discharge date: 11/14/21  Discharge Physician: Edwin Dada   PCP: [name], DO     Recommendations at discharge:  Follow up with PCP Dr. Raoul Pitch in 1 week Dr. Raoul Pitch: Patient with new abnormal liver function tests, please repeat in 1 week and follow up as indicated     Discharge Diagnoses: Principal Problem:   Severe sepsis [facility]) Active Problems:   Depression with anxiety   Transaminitis   Hyponatremia   Hypokalemia   Normocytic anemia      Hospital Course: Lindsay Hamilton is a 56 y.o. F with no significant PMHx who presented with a few days of lower abdominal discomfort, urinary urgency, and dysuria as well as nausea and vomiting.  In the ER, urinalysis showed pyuria, bacteria.  CT of the abdomen and pelvis with contrast showed bilateral pyelonephritis and stranding, no evidence of hydronephrosis or kidney stone.  She started to develop hypotension, and so was started on antibiotics, fluids and admitted.       * Severe sepsis ([name]) Presented with fever, WBC 11.6K, tachypnea >20, and hypotension.  Urine source.  CT shows no hydronephrosis, no history of stones.  She defervesced quickly, had no further fever, tolerated PO and ambulated well.  Urine and blood cultures growing E coli.  Discussed with Infectious Disease pharmacy, appropriate for higher dose Cipro to complete 7 days.   Transaminitis Unclear cause.  New, so doubt fatty liver.  ?recurrence of alcohol use.  HIV-, viral hepatitis serologies not sent.  May just be septic injury.    - Recommend outpatient follow-up with PCP               The [place] was reviewed for this patient prior to discharge.      Disposition: Home Diet recommendation: Regular   DISCHARGE MEDICATION: Allergies as of 11/14/2021   No Known Allergies       Medication List     TAKE these medications    buPROPion 150 MG 24 hr tablet Commonly known as: WELLBUTRIN XL Take 1 tablet (150 mg total) by mouth daily. What changed: when to take this   celecoxib 200 MG capsule Commonly known as: CELEBREX Take 200 mg by mouth 2 (two) times daily.   ciprofloxacin 750 MG tablet Commonly known as: CIPRO Take 1 tablet (750 mg total) by mouth 2 (two) times daily.   clonazePAM 0.5 MG tablet Commonly known as: KLONOPIN 1/2 tab during the day and 1-2 tabs QHS and What changed:  how much to take how to take this when to take this additional instructions   escitalopram 20 MG tablet Commonly known as: LEXAPRO Take 1 tablet (20 mg total) by mouth at bedtime.   gabapentin 100 MG capsule Commonly known as: NEURONTIN Take 100 mg by mouth every evening.   Linzess 72 MCG capsule Generic drug: linaclotide TAKE 1 CAPSULE BY MOUTH DAILY BEFORE BREAKFAST. What changed: See the new instructions.   NON FORMULARY See admin instructions. BioTrue contact solution- For use with contacts   omeprazole 40 MG capsule Commonly known as: PRILOSEC Take 1 capsule (40 mg total) by mouth daily. What changed: when to take this   ondansetron 4 MG disintegrating tablet Commonly known as: ZOFRAN-ODT Take 1 tablet (4 mg total) by mouth every 8 (eight) hours as needed for nausea or vomiting. What changed:  medication strength  how much to take when to take this reasons to take this        Follow-up Information     Kuneff, Renee A, DO. Schedule an appointment as soon as possible for a visit today.   Specialty: Family Medicine Contact information: [address] [place] [address] [phone]                 Discharge Instructions     Discharge instructions   Complete by: As directed    From Dr. Loleta Books: You were admitted with a urinary tract infection.   This had spread to your bloodstream, which makes it potentially more  serious Thankfully, your treatment was started promptly, and you got better and we can tell from your culture results that we can treat this fully  Take ciprofloxacin 750 mg (1 tab) twice daily starting Friday morning, for 4 more days  Go see Dr. Raoul Pitch in 1 week  Important: Ask Dr. Raoul Pitch to recheck your liver function tests and if they are still abnormal, to look into this further   Increase activity slowly   Complete by: As directed        Discharge Exam: Filed Weights   11/12/21 0620 11/12/21 2126  Weight: 70.3 kg 74.4 kg    General: Pt is alert, awake, not in acute distress Cardiovascular: RRR, nl S1-S2, no murmurs appreciated.   No LE edema.   Respiratory: Normal respiratory rate and rhythm.  CTAB without rales or wheezes. Abdominal: Abdomen soft and non-tender.  No distension or HSM.   Neuro/Psych: Strength symmetric in upper and lower extremities.  Judgment and insight appear normal.   Condition at discharge: good  The results of significant diagnostics from this hospitalization (including imaging, microbiology, ancillary and laboratory) are listed below for reference.   Imaging Studies: CT ABDOMEN PELVIS W CONTRAST  Result Date: 11/12/2021 CLINICAL DATA:  Abdominal pain EXAM: CT ABDOMEN AND PELVIS WITH CONTRAST TECHNIQUE: Multidetector CT imaging of the abdomen and pelvis was performed using the standard protocol following bolus administration of intravenous contrast. RADIATION DOSE REDUCTION: This exam was performed according to the departmental dose-optimization program which includes automated exposure control, adjustment of the mA and/or kV according to patient size and/or use of iterative reconstruction technique. CONTRAST:  1102m OMNIPAQUE IOHEXOL 300 MG/ML  SOLN COMPARISON:  CT abdomen and pelvis dated July 29, 2020 FINDINGS: Lower chest: Small hiatal hernia.  No acute abnormality. Hepatobiliary: No focal liver abnormality is seen. Status post cholecystectomy. No  biliary dilatation. Pancreas: Unremarkable. No pancreatic ductal dilatation or surrounding inflammatory changes. Spleen: Normal in size without focal abnormality. Adrenals/Urinary Tract: Bilateral adrenal glands are unremarkable. No hydronephrosis, nephrolithiasis or suspicious renal lesions. Heterogeneous enhancement of the bilateral kidneys is seen on delayed imaging. Mild bilateral urothelial enhancement. Bladder wall thickening with perivesicular fat stranding. Stomach/Bowel: Stomach is within normal limits. Appendix appears normal. No evidence of bowel wall thickening, distention, or inflammatory changes. Vascular/Lymphatic: Aortic atherosclerosis. No enlarged abdominal or pelvic lymph nodes. Reproductive: Status post hysterectomy. No adnexal masses. Other: No abdominal wall hernia or abnormality. No abdominopelvic ascites. Musculoskeletal: No acute or significant osseous findings. IMPRESSION: 1. Heterogeneous enhancement of the bilateral kidneys on delayed imaging, mild bilateral urothelial enhancement and bladder wall thickening, findings can be seen in the setting of infection. Recommend correlation with urinalysis. 2.  Aortic Atherosclerosis (ICD10-I70.0). Electronically Signed   By: LYetta GlassmanM.D.   On: 11/12/2021 07:57    Microbiology: Results for orders placed or performed during the  hospital encounter of 11/12/21  Urine Culture     Status: Abnormal   Collection Time: 11/12/21  6:32 AM   Specimen: Urine, Clean Catch  Result Value Ref Range Status   Specimen Description   Final    URINE, CLEAN CATCH Performed at [facility], [address] [place], [place] [address]    Special Requests   Final    NONE Performed at [facility], [address] [place], [place] [address]    Culture >=100,000 COLONIES/mL ESCHERICHIA COLI (A)  Final   Report Status 11/14/2021 FINAL  Final   Organism ID, Bacteria ESCHERICHIA COLI (A)  Final      Susceptibility   Escherichia  coli - MIC*    AMPICILLIN >=32 RESISTANT Resistant     CEFAZOLIN 8 SENSITIVE Sensitive     CEFEPIME <=0.12 SENSITIVE Sensitive     CEFTRIAXONE <=0.25 SENSITIVE Sensitive     CIPROFLOXACIN <=0.25 SENSITIVE Sensitive     GENTAMICIN >=16 RESISTANT Resistant     IMIPENEM <=0.25 SENSITIVE Sensitive     NITROFURANTOIN <=16 SENSITIVE Sensitive     TRIMETH/SULFA >=320 RESISTANT Resistant     AMPICILLIN/SULBACTAM >=32 RESISTANT Resistant     PIP/TAZO <=4 SENSITIVE Sensitive     * >=100,000 COLONIES/mL ESCHERICHIA COLI  Blood culture (routine x 2)     Status: Abnormal   Collection Time: 11/12/21  7:50 AM   Specimen: BLOOD  Result Value Ref Range Status   Specimen Description BLOOD LEFT ANTECUBITAL  Final   Special Requests   Final    BOTTLES DRAWN AEROBIC AND ANAEROBIC Blood Culture adequate volume   Culture  Setup Time   Final    GRAM NEGATIVE RODS IN BOTH AEROBIC AND ANAEROBIC BOTTLES CRITICAL VALUE NOTED.  VALUE IS CONSISTENT WITH PREVIOUSLY REPORTED AND CALLED VALUE.    Culture (A)  Final    ESCHERICHIA COLI SUSCEPTIBILITIES PERFORMED ON PREVIOUS CULTURE WITHIN THE LAST 5 DAYS.    Report Status 11/14/2021 FINAL  Final  Blood culture (routine x 2)     Status: Abnormal   Collection Time: 11/12/21  8:00 AM   Specimen: BLOOD  Result Value Ref Range Status   Specimen Description   Final    BLOOD BLOOD RIGHT HAND Performed at [facility], [address] [place], [place] [address]    Special Requests   Final    BOTTLES DRAWN AEROBIC AND ANAEROBIC Blood Culture adequate volume Performed at [facility], [address] [place], [place] [address]    Culture  Setup Time   Final    GRAM NEGATIVE RODS IN BOTH AEROBIC AND ANAEROBIC BOTTLES CRITICAL RESULT CALLED TO, READ BACK BY AND VERIFIED WITH: E JACKSON,PHARMD'@2342'$  11/12/21 [place] Performed at [facility], [place] [address] [place], [place] [address]    Culture ESCHERICHIA COLI (A)  Final   Report Status  11/14/2021 FINAL  Final   Organism ID, Bacteria ESCHERICHIA COLI  Final      Susceptibility   Escherichia coli - MIC*    AMPICILLIN >=32 RESISTANT Resistant     CEFAZOLIN 8 SENSITIVE Sensitive     CEFEPIME <=0.12 SENSITIVE Sensitive     CEFTAZIDIME <=1 SENSITIVE Sensitive     CEFTRIAXONE <=0.25 SENSITIVE Sensitive     CIPROFLOXACIN 0.5 INTERMEDIATE Intermediate     GENTAMICIN >=16 RESISTANT Resistant     IMIPENEM <=0.25 SENSITIVE Sensitive     TRIMETH/SULFA >=320 RESISTANT Resistant  AMPICILLIN/SULBACTAM >=32 RESISTANT Resistant     PIP/TAZO <=4 SENSITIVE Sensitive     * ESCHERICHIA COLI  Blood Culture ID Panel (Reflexed)     Status: Abnormal   Collection Time: 11/12/21  8:00 AM  Result Value Ref Range Status   Enterococcus faecalis NOT DETECTED NOT DETECTED Final   Enterococcus Faecium NOT DETECTED NOT DETECTED Final   Listeria monocytogenes NOT DETECTED NOT DETECTED Final   Staphylococcus species NOT DETECTED NOT DETECTED Final   Staphylococcus aureus (BCID) NOT DETECTED NOT DETECTED Final   Staphylococcus epidermidis NOT DETECTED NOT DETECTED Final   Staphylococcus lugdunensis NOT DETECTED NOT DETECTED Final   Streptococcus species NOT DETECTED NOT DETECTED Final   Streptococcus agalactiae NOT DETECTED NOT DETECTED Final   Streptococcus pneumoniae NOT DETECTED NOT DETECTED Final   Streptococcus pyogenes NOT DETECTED NOT DETECTED Final   A.calcoaceticus-baumannii NOT DETECTED NOT DETECTED Final   Bacteroides fragilis NOT DETECTED NOT DETECTED Final   Enterobacterales DETECTED (A) NOT DETECTED Final    Comment: Enterobacterales represent a large order of gram negative bacteria, not a single organism. CRITICAL RESULT CALLED TO, READ BACK BY AND VERIFIED WITH: E JACKSON,PHARMD'@2343'$  11/12/21 [place]    Enterobacter cloacae complex NOT DETECTED NOT DETECTED Final   Escherichia coli DETECTED (A) NOT DETECTED Final    Comment: CRITICAL RESULT CALLED TO, READ BACK BY AND VERIFIED  WITH: E JACKSON,PHARMD'@2342'$  11/12/21 [place]    Klebsiella aerogenes NOT DETECTED NOT DETECTED Final   Klebsiella oxytoca NOT DETECTED NOT DETECTED Final   Klebsiella pneumoniae NOT DETECTED NOT DETECTED Final   Proteus species NOT DETECTED NOT DETECTED Final   Salmonella species NOT DETECTED NOT DETECTED Final   Serratia marcescens NOT DETECTED NOT DETECTED Final   Haemophilus influenzae NOT DETECTED NOT DETECTED Final   Neisseria meningitidis NOT DETECTED NOT DETECTED Final   Pseudomonas aeruginosa NOT DETECTED NOT DETECTED Final   Stenotrophomonas maltophilia NOT DETECTED NOT DETECTED Final   Candida albicans NOT DETECTED NOT DETECTED Final   Candida auris NOT DETECTED NOT DETECTED Final   Candida glabrata NOT DETECTED NOT DETECTED Final   Candida krusei NOT DETECTED NOT DETECTED Final   Candida parapsilosis NOT DETECTED NOT DETECTED Final   Candida tropicalis NOT DETECTED NOT DETECTED Final   Cryptococcus neoformans/gattii NOT DETECTED NOT DETECTED Final   CTX-M ESBL NOT DETECTED NOT DETECTED Final   Carbapenem resistance IMP NOT DETECTED NOT DETECTED Final   Carbapenem resistance KPC NOT DETECTED NOT DETECTED Final   Carbapenem resistance NDM NOT DETECTED NOT DETECTED Final   Carbapenem resist OXA 48 LIKE NOT DETECTED NOT DETECTED Final   Carbapenem resistance VIM NOT DETECTED NOT DETECTED Final    Comment: Performed at [facility], [address], [place], [place] [address]  MRSA Next Gen by PCR, Nasal     Status: None   Collection Time: 11/12/21  9:25 PM   Specimen: Nasal Mucosa; Nasal Swab  Result Value Ref Range Status   MRSA by PCR Next Gen NOT DETECTED NOT DETECTED Final    Comment: (NOTE) The GeneXpert MRSA Assay (FDA approved for NASAL specimens only), is one component of a comprehensive MRSA colonization surveillance program. It is not intended to diagnose MRSA infection nor to guide or monitor treatment for MRSA infections. Test performance is not FDA approved in  patients less than 38 years old. Performed at [facility], [place] [address], [place],  [address]     Labs: CBC: Recent Labs  Lab 11/12/21 [phone] 11/13/21  4081 11/14/21 0317  WBC 11.6* 12.2* 8.2  NEUTROABS 9.7*  --   --   HGB 12.3 11.1* 10.0*  HCT 37.8 34.9* 31.7*  MCV 86.5 89.7 90.3  PLT 280 202 448   Basic Metabolic Panel: Recent Labs  Lab 11/12/21 0639 11/13/21 0311 11/14/21 0317  NA 134* 144 142  K 3.6 3.3* 3.4*  CL 101 111 111  CO2 '23 25 23  '$ GLUCOSE 117* 100* 95  BUN 9 6 5*  CREATININE 0.83 0.77 0.61  CALCIUM 8.9 8.5* 8.5*   Liver Function Tests: Recent Labs  Lab 11/12/21 0639 11/13/21 0311 11/14/21 0317  AST 48* 52* 43*  ALT 83* 80* 73*  ALKPHOS 85 107 102  BILITOT 1.5* 1.4* 0.7  PROT 7.6 6.3* 6.1*  ALBUMIN 3.5 2.9* 2.9*   CBG: Recent Labs  Lab 11/12/21 2128  GLUCAP 103*    Discharge time spent: approximately 35 minutes spent on discharge counseling, evaluation of patient on day of discharge, and coordination of discharge planning with nursing, social work, pharmacy and case management  Signed: Edwin Dada, MD [facility] 11/14/2021

## 2021-11-14 NOTE — Plan of Care (Signed)

## 2021-11-18 ENCOUNTER — Ambulatory Visit: Payer: Federal, State, Local not specified - PPO | Admitting: Family Medicine

## 2021-11-18 VITALS — BP 106/71 | HR 67 | Temp 97.9°F | Ht 64.0 in | Wt 158.0 lb

## 2021-11-18 DIAGNOSIS — Z9289 Personal history of other medical treatment: Secondary | ICD-10-CM

## 2021-11-18 DIAGNOSIS — R6521 Severe sepsis with septic shock: Secondary | ICD-10-CM | POA: Diagnosis not present

## 2021-11-18 DIAGNOSIS — A419 Sepsis, unspecified organism: Secondary | ICD-10-CM | POA: Diagnosis not present

## 2021-11-18 DIAGNOSIS — N12 Tubulo-interstitial nephritis, not specified as acute or chronic: Secondary | ICD-10-CM | POA: Diagnosis not present

## 2021-11-18 NOTE — Patient Instructions (Signed)
We will call you tomorrow with further plan.   It will take some time to get back to feeling your normal. We will make sure infection is not returning by labs today

## 2021-11-18 NOTE — Progress Notes (Unsigned)
Lindsay Hamilton , 01/15/1966, 56 y.o., female MRN: [id number] Patient Care Team    Relationship Specialty Notifications Start End  [name], DO PCP - General Family Medicine  04/15/19   Sharia Reeve, MD Referring Physician Psychiatry  09/14/13   Garnet Sierras, DO Consulting Physician Allergy  03/17/19   Clarene Essex, MD Consulting Physician Gastroenterology  04/15/19   Vickey Huger, MD Consulting Physician Orthopedic Surgery  04/15/19   Donia Ast, [place]  Orthopedic Surgery  04/15/19   Roel Cluck, MD Referring Physician Ophthalmology  04/15/19   Lelon Perla, MD Consulting Physician Cardiology  04/15/19   Kathrynn Ducking, MD (Inactive) Consulting Physician Neurology  04/15/19   Arvella Nigh, MD Consulting Physician Obstetrics and Gynecology  04/15/19   Juluis Rainier  Optometry  04/15/19   Clabe Seal, DMD  Orthodontics  04/15/19     Chief Complaint  Patient presents with   Fever    Pt c/o low grade temp 99.8, chills, R flank pain, low back pain, x 1 day; pt has hos f/u tomorrow      Subjective: Pt presents for an OV with complaints of *** of *** duration.  Associated symptoms include ***.  Pt has tried *** to ease their symptoms.  ***     08/19/2021    2:37 PM 03/04/2021    2:08 PM 01/28/2021    9:19 AM 08/08/2020   10:37 AM 01/27/2020    1:11 PM  Depression screen PHQ 2/9  Decreased Interest 2 0 0  0  Down, Depressed, Hopeless 2 0 1 1 0  PHQ - 2 Score 4 0 1 1 0  Altered sleeping _0 0  Tired, decreased energy _1 0  Change in appetite 0  0 1 0  Feeling bad or failure about yourself  0  0 0 0  Trouble concentrating 3  0 0 0  Moving slowly or fidgety/restless _2 0  Suicidal thoughts 0  0 0 0  PHQ-9 Score _3 0    No Known Allergies Social History   Social History Narrative   Marital status/children/pets: Married.  2 children.   Education/employment: High school graduate with some college.  Works as a  Stage manager.   Safety:      -smoke alarm in the home:Yes     - wears seatbelt: Yes     - Feels safe in their relationships: Yes   Past Medical History:  Diagnosis Date   Alcohol abuse, in remission    Depression    Head injury    Headaches due to old head injury    Ingrowing nail 02/20/2020   Insomnia 09/14/2013   Migraine without aura, without mention of intractable migraine without mention of status migrainosus 09/14/2013   Obese    Pancreatitis    Pseudoseizures    Right upper quadrant pain 06/26/2020   Tubular adenoma of colon 2018   Past Surgical History:  Procedure Laterality Date   ABDOMINAL HYSTERECTOMY  2009   Partial   CARPAL [place]     x2-1992, 1997   CHOLECYSTECTOMY  2002   COLONOSCOPY W/ BIOPSIES  05/2016   tubular adenoma   GREAT TOE ARTHRODESIS, METATARSALPHALANGEAL JOINT Right 07/17/2018   KNEE ARTHROSCOPY     bilateral, 3 right and 3 left   SHOULDER ARTHROSCOPY Bilateral  Right 2012, left 2014   Family History  Problem Relation Age of Onset   Hypertension Mother    CAD Mother        Died of MI at age 37   Asthma Mother    Pulmonary embolism Mother        x2   Cancer - Prostate Father    Atrial fibrillation Father    Hyperlipidemia Father    Seizures Sister    Migraines Sister    Hypertension Brother    Hypertension Brother    Lung cancer Paternal Grandmother    Breast cancer Neg Hx    Allergic rhinitis Neg Hx    Eczema Neg Hx    Urticaria Neg Hx    Allergies as of 11/18/2021   No Known Allergies      Medication List        Accurate as of November 18, 2021  4:21 PM. If you have any questions, ask your nurse or doctor.          buPROPion 150 MG 24 hr tablet Commonly known as: WELLBUTRIN XL Take 1 tablet (150 mg total) by mouth daily. What changed: when to take this   celecoxib 200 MG capsule Commonly known as: CELEBREX Take 200 mg by mouth 2 (two) times daily.    ciprofloxacin 750 MG tablet Commonly known as: CIPRO Take 1 tablet (750 mg total) by mouth 2 (two) times daily.   clonazePAM 0.5 MG tablet Commonly known as: KLONOPIN 1/2 tab during the day and 1-2 tabs QHS and What changed:  how much to take how to take this when to take this additional instructions   escitalopram 20 MG tablet Commonly known as: LEXAPRO Take 1 tablet (20 mg total) by mouth at bedtime.   gabapentin 100 MG capsule Commonly known as: NEURONTIN Take 100 mg by mouth every evening.   Linzess 72 MCG capsule Generic drug: linaclotide TAKE 1 CAPSULE BY MOUTH DAILY BEFORE BREAKFAST. What changed: See the new instructions.   NON FORMULARY See admin instructions. BioTrue contact solution- For use with contacts   omeprazole 40 MG capsule Commonly known as: PRILOSEC Take 1 capsule (40 mg total) by mouth daily. What changed: when to take this   ondansetron 4 MG disintegrating tablet Commonly known as: ZOFRAN-ODT Take 1 tablet (4 mg total) by mouth every 8 (eight) hours as needed for nausea or vomiting.        All past medical history, surgical history, allergies, family history, immunizations andmedications were updated in the EMR today and reviewed under the history and medication portions of their EMR.     ROS Negative, with the exception of above mentioned in HPI   Objective:  BP 106/71   Pulse 67   Temp 97.9 F (36.6 C) (Oral)   Ht _0  (1.626 m)   Wt 158 lb (71.7 kg)   SpO2 98%   BMI 27.12 kg/m  Body mass index is 27.12 kg/m. Physical Exam ***  No results found. No results found. No results found for this or any previous visit (from the past 24 hour(s)).  Assessment/Plan: Lindsay Hamilton is a 56 y.o. female present for OV for  *** Reviewed expectations re: course of current medical issues. Discussed self-management of symptoms. Outlined signs and symptoms indicating need for more acute intervention. Patient verbalized  understanding and all questions were answered. Patient received an After-Visit Summary.    Orders Placed This Encounter  Procedures   Comp Met (CMET)  Urinalysis w microscopic + reflex cultur   CBC w/Diff   No orders of the defined types were placed in this encounter.  Referral Orders  No referral(s) requested today     Note is dictated utilizing voice recognition software. Although note has been proof read prior to signing, occasional typographical errors still can be missed. If any questions arise, please do not hesitate to call for verification.   electronically signed by:  Howard Pouch, DO  [place]

## 2021-11-19 ENCOUNTER — Telehealth: Payer: Self-pay | Admitting: Family Medicine

## 2021-11-19 ENCOUNTER — Inpatient Hospital Stay: Payer: Federal, State, Local not specified - PPO | Admitting: Family Medicine

## 2021-11-19 ENCOUNTER — Emergency Department (HOSPITAL_BASED_OUTPATIENT_CLINIC_OR_DEPARTMENT_OTHER): Payer: Federal, State, Local not specified - PPO

## 2021-11-19 ENCOUNTER — Telehealth: Payer: Self-pay

## 2021-11-19 ENCOUNTER — Other Ambulatory Visit: Payer: Self-pay

## 2021-11-19 ENCOUNTER — Emergency Department (HOSPITAL_BASED_OUTPATIENT_CLINIC_OR_DEPARTMENT_OTHER)
Admission: EM | Admit: 2021-11-19 | Discharge: 2021-11-19 | Disposition: A | Discharge status: Home / Self Care | Payer: Federal, State, Local not specified - PPO | Attending: Emergency Medicine | Admitting: Emergency Medicine

## 2021-11-19 ENCOUNTER — Encounter: Payer: Self-pay | Admitting: Family Medicine

## 2021-11-19 ENCOUNTER — Encounter (HOSPITAL_BASED_OUTPATIENT_CLINIC_OR_DEPARTMENT_OTHER): Payer: Self-pay

## 2021-11-19 ENCOUNTER — Emergency Department (HOSPITAL_BASED_OUTPATIENT_CLINIC_OR_DEPARTMENT_OTHER): Payer: Federal, State, Local not specified - PPO | Admitting: Radiology

## 2021-11-19 DIAGNOSIS — Z20822 Contact with and (suspected) exposure to covid-19: Secondary | ICD-10-CM | POA: Diagnosis not present

## 2021-11-19 DIAGNOSIS — R112 Nausea with vomiting, unspecified: Secondary | ICD-10-CM | POA: Insufficient documentation

## 2021-11-19 DIAGNOSIS — M549 Dorsalgia, unspecified: Secondary | ICD-10-CM | POA: Diagnosis not present

## 2021-11-19 DIAGNOSIS — A419 Sepsis, unspecified organism: Secondary | ICD-10-CM | POA: Diagnosis not present

## 2021-11-19 DIAGNOSIS — R5383 Other fatigue: Secondary | ICD-10-CM | POA: Insufficient documentation

## 2021-11-19 DIAGNOSIS — R109 Unspecified abdominal pain: Secondary | ICD-10-CM | POA: Insufficient documentation

## 2021-11-19 DIAGNOSIS — R509 Fever, unspecified: Secondary | ICD-10-CM | POA: Diagnosis not present

## 2021-11-19 DIAGNOSIS — R5381 Other malaise: Secondary | ICD-10-CM | POA: Diagnosis not present

## 2021-11-19 LAB — URINALYSIS, ROUTINE W REFLEX MICROSCOPIC
Bilirubin Urine: NEGATIVE
Glucose, UA: NEGATIVE mg/dL
Hgb urine dipstick: NEGATIVE
Ketones, ur: NEGATIVE mg/dL
Leukocytes,Ua: NEGATIVE
Nitrite: NEGATIVE
Protein, ur: NEGATIVE mg/dL
Specific Gravity, Urine: 1.006 (ref 1.005–1.030)
pH: 6.5 (ref 5.0–8.0)

## 2021-11-19 LAB — RESP PANEL BY RT-PCR (FLU A&B, COVID) ARPGX2
Influenza A by PCR: NEGATIVE
Influenza B by PCR: NEGATIVE
SARS Coronavirus 2 by RT PCR: NEGATIVE

## 2021-11-19 LAB — COMPREHENSIVE METABOLIC PANEL
ALT: 103 U/L — ABNORMAL HIGH (ref 0–44)
AST: 53 U/L — ABNORMAL HIGH (ref 15–41)
Albumin: 4.1 g/dL (ref 3.5–5.0)
Alkaline Phosphatase: 76 U/L (ref 38–126)
Anion gap: 9 (ref 5–15)
BUN: 13 mg/dL (ref 6–20)
CO2: 28 mmol/L (ref 22–32)
Calcium: 9.8 mg/dL (ref 8.9–10.3)
Chloride: 103 mmol/L (ref 98–111)
Creatinine, Ser: 0.66 mg/dL (ref 0.44–1.00)
GFR, Estimated: >60 mL/min (ref >60)
Glucose, Bld: 98 mg/dL (ref 70–99)
Potassium: 3.9 mmol/L (ref 3.5–5.1)
Sodium: 140 mmol/L (ref 135–145)
Total Bilirubin: 0.3 mg/dL (ref 0.3–1.2)
Total Protein: 6.9 g/dL (ref 6.5–8.1)

## 2021-11-19 LAB — CBC WITH DIFFERENTIAL/PLATELET
Abs Immature Granulocytes: 0.09 10*3/uL — ABNORMAL HIGH (ref 0.00–0.07)
Basophils Absolute: 0 10*3/uL (ref 0.0–0.1)
Basophils Relative: 0 %
Eosinophils Absolute: 0.1 10*3/uL (ref 0.0–0.5)
Eosinophils Relative: 2 %
HCT: 35.5 % — ABNORMAL LOW (ref 36.0–46.0)
Hemoglobin: 11.5 g/dL — ABNORMAL LOW (ref 12.0–15.0)
Immature Granulocytes: 1 %
Lymphocytes Relative: 32 %
Lymphs Abs: 2.3 10*3/uL (ref 0.7–4.0)
MCH: 28.3 pg (ref 26.0–34.0)
MCHC: 32.4 g/dL (ref 30.0–36.0)
MCV: 87.2 fL (ref 80.0–100.0)
Monocytes Absolute: 0.4 10*3/uL (ref 0.1–1.0)
Monocytes Relative: 5 %
Neutro Abs: 4.3 10*3/uL (ref 1.7–7.7)
Neutrophils Relative %: 60 %
Platelets: 379 10*3/uL (ref 150–400)
RBC: 4.07 MIL/uL (ref 3.87–5.11)
RDW: 13.9 % (ref 11.5–15.5)
WBC: 7.2 10*3/uL (ref 4.0–10.5)
nRBC: 0 % (ref 0.0–0.2)

## 2021-11-19 LAB — LIPASE, BLOOD: Lipase: 46 U/L (ref 11–51)

## 2021-11-19 LAB — LACTIC ACID, PLASMA: Lactic Acid, Venous: 0.5 mmol/L (ref 0.5–1.9)

## 2021-11-19 MED ORDER — KETOROLAC TROMETHAMINE 15 MG/ML IJ SOLN
15.0000 mg | Freq: Once | INTRAMUSCULAR | Status: AC
  Administered 2021-11-19: 15 mg via INTRAVENOUS
  Filled 2021-11-19: qty 1

## 2021-11-19 MED ORDER — ACETAMINOPHEN 325 MG PO TABS
650.0000 mg | ORAL_TABLET | Freq: Once | ORAL | Status: AC
  Administered 2021-11-19: 650 mg via ORAL
  Filled 2021-11-19: qty 2

## 2021-11-19 MED ORDER — LACTATED RINGERS IV BOLUS
1000.0000 mL | Freq: Once | INTRAVENOUS | Status: AC
  Administered 2021-11-19: 1000 mL via INTRAVENOUS

## 2021-11-19 MED ORDER — ONDANSETRON 4 MG PO TBDP: ORAL_TABLET | ORAL | 0 refills | Status: DC

## 2021-11-19 NOTE — Telephone Encounter (Signed)
Pt seen pcp 7/17  [facility] - Client Client Site [place] - Day Provider Raoul Pitch, [place] Type Call Who Is Calling Patient / Member / Family / Caregiver Caller Name Grubbs Phone Number [phone] Patient Name Lindsay Hamilton Patient DOB July 20, 1965 Call Type Message Only Information Provided Reason for Call Request to Schedule Office Appointment Initial Comment Caller was released from hospital on Thursday and needing to get a follow up appt today Disp. Time Disposition Final User 11/18/2021 8:01:26 AM General Information Provided Yes [name], [name] Call Closed By: Nelia Shi Transaction Date/Time: 11/18/2021 7:57:59 AM (ET)

## 2021-11-19 NOTE — Progress Notes (Signed)
Lindsay Hamilton , 03/28/1966, 56 y.o., female MRN: [id number] Patient Care Team    Relationship Specialty Notifications Start End  [name], DO PCP - General Family Medicine  04/15/19   Sharia Reeve, MD Referring Physician Psychiatry  09/14/13   Garnet Sierras, DO Consulting Physician Allergy  03/17/19   Clarene Essex, MD Consulting Physician Gastroenterology  04/15/19   Vickey Huger, MD Consulting Physician Orthopedic Surgery  04/15/19   Donia Ast, [place]  Orthopedic Surgery  04/15/19   Roel Cluck, MD Referring Physician Ophthalmology  04/15/19   Lelon Perla, MD Consulting Physician Cardiology  04/15/19   Kathrynn Ducking, MD (Inactive) Consulting Physician Neurology  04/15/19   Arvella Nigh, MD Consulting Physician Obstetrics and Gynecology  04/15/19   Juluis Rainier  Optometry  04/15/19   Clabe Seal, DMD  Orthodontics  04/15/19     Chief Complaint  Patient presents with   Fever    Pt c/o low grade temp 99.8, chills, R flank pain, low back pain, x 1 day; pt has hos f/u tomorrow      Subjective:  Lindsay Hamilton  is a 56 y.o. female presents for hospital follow up after recent admission on 11/12/2021 for primary diagnosis septic shock. Lindsay Hamilton was discharged on 11/14/2021 to home. Patients discharge summary has been reviewed, as well as all labs/image studies obtained during hospitalization.   Patients hospital course: Patient presented to the ED with few days of lower abdominal discomfort, urinary urgency and dysuria as well as nausea and vomiting.  She was found to have evidence of a UTI.  CT of the abdomen and pelvis with contrast showed bilateral pyelonephritis and stranding.  There is no evidence of hydronephrosis or kidney stones.  She developed hypotension and was started on antibiotics, fluids and admitted.  She was later found to have elevated LFTs, tachypneic and hypotension. Since hospital discharge patient reports patient  reports since her discharge she has felt extremely fatigued.  She denies any nausea or vomit, but states her appetite is extremely decreased.  She has 1 Cipro 750 mg pill left to take to complete her antibiotic course.  She states she got worried yesterday when she had a temperature of 99.9 F.  She states she just did not know what to do, and did not feel the discharge instructions from the hospital were very clear on next step.  She denies any chills or diarrhea.  Recent Labs  Lab 11/13/21 0311 11/14/21 0317 11/18/21 1554  HGB 11.1* 10.0* 11.7  HCT 34.9* 31.7* 36.3  WBC 12.2* 8.2 5.8  PLT 202 226 386      Latest Ref Rng & Units 11/18/2021    3:54 PM 11/14/2021    3:17 AM 11/13/2021    3:11 AM  CMP  Glucose 65 - 99 mg/dL 100  95  100   BUN 7 - 25 mg/dL _0 Creatinine 0.50 - 1.03 mg/dL 0.69  0.61  0.77   Sodium 135 - 146 mmol/L 141  142  144   Potassium 3.5 - 5.3 mmol/L 4.3  3.4  3.3   Chloride 98 - 110 mmol/L 105  111  111   CO2 20 - 32 mmol/L _1 Calcium 8.6 - 10.4 mg/dL 9.2  8.5  8.5   Total Protein 6.1 - 8.1 g/dL 6.7  6.1  6.3   Total Bilirubin 0.2 -  1.2 mg/dL 0.3  0.7  1.4   Alkaline Phos 38 - 126 U/L  102  107   AST 10 - 35 U/L 84  43  52   ALT 6 - 29 U/L 119  73  80       CT ABDOMEN PELVIS W CONTRAST  Result Date: 11/12/2021 CLINICAL DATA:  Abdominal pain EXAM: CT ABDOMEN AND PELVIS WITH CONTRAST TECHNIQUE: Multidetector CT imaging of the abdomen and pelvis was performed using the standard protocol following bolus administration of intravenous contrast. RADIATION DOSE REDUCTION: This exam was performed according to the departmental dose-optimization program which includes automated exposure control, adjustment of the mA and/or kV according to patient size and/or use of iterative reconstruction technique. CONTRAST:  140m OMNIPAQUE IOHEXOL 300 MG/ML  SOLN COMPARISON:  CT abdomen and pelvis dated July 29, 2020 FINDINGS: Lower chest: Small hiatal hernia.  No  acute abnormality. Hepatobiliary: No focal liver abnormality is seen. Status post cholecystectomy. No biliary dilatation. Pancreas: Unremarkable. No pancreatic ductal dilatation or surrounding inflammatory changes. Spleen: Normal in size without focal abnormality. Adrenals/Urinary Tract: Bilateral adrenal glands are unremarkable. No hydronephrosis, nephrolithiasis or suspicious renal lesions. Heterogeneous enhancement of the bilateral kidneys is seen on delayed imaging. Mild bilateral urothelial enhancement. Bladder wall thickening with perivesicular fat stranding. Stomach/Bowel: Stomach is within normal limits. Appendix appears normal. No evidence of bowel wall thickening, distention, or inflammatory changes. Vascular/Lymphatic: Aortic atherosclerosis. No enlarged abdominal or pelvic lymph nodes. Reproductive: Status post hysterectomy. No adnexal masses. Other: No abdominal wall hernia or abnormality. No abdominopelvic ascites. Musculoskeletal: No acute or significant osseous findings. IMPRESSION: 1. Heterogeneous enhancement of the bilateral kidneys on delayed imaging, mild bilateral urothelial enhancement and bladder wall thickening, findings can be seen in the setting of infection. Recommend correlation with urinalysis. 2.  Aortic Atherosclerosis (ICD10-I70.0). Electronically Signed   By: LYetta GlassmanM.D.   On: 11/12/2021 07:57        08/19/2021    2:37 PM 03/04/2021    2:08 PM 01/28/2021    9:19 AM 08/08/2020   10:37 AM 01/27/2020    1:11 PM  Depression screen PHQ 2/9  Decreased Interest 2 0 0  0  Down, Depressed, Hopeless 2 0 1 1 0  PHQ - 2 Score 4 0 1 1 0  Altered sleeping _0 0  Tired, decreased energy _1 0  Change in appetite 0  0 1 0  Feeling bad or failure about yourself  0  0 0 0  Trouble concentrating 3  0 0 0  Moving slowly or fidgety/restless _2 0  Suicidal thoughts 0  0 0 0  PHQ-9 Score _3 0    No Known Allergies Social History   Tobacco Use   Smoking  status: Former    Types: Cigarettes    Quit date: 1997    Years since quitting: 26.5   Smokeless tobacco: Never  Substance Use Topics   Alcohol use: Yes    Comment: social   Past Medical History:  Diagnosis Date   Alcohol abuse, in remission    Depression    Head injury    Headaches due to old head injury    Ingrowing nail 02/20/2020   Insomnia 09/14/2013   Migraine without aura, without mention of intractable migraine without mention of status migrainosus 09/14/2013   Obese    Pancreatitis    Pseudoseizures    Right upper quadrant pain 06/26/2020  Tubular adenoma of colon 2018   Past Surgical History:  Procedure Laterality Date   ABDOMINAL HYSTERECTOMY  2009   Partial   CARPAL TUNNEL RELEASE  1998   CESAREAN SECTION     x2-1992, 1997   CHOLECYSTECTOMY  2002   COLONOSCOPY W/ BIOPSIES  05/2016   tubular adenoma   GREAT TOE ARTHRODESIS, METATARSALPHALANGEAL JOINT Right 07/17/2018   KNEE ARTHROSCOPY     bilateral, 3 right and 3 left   SHOULDER ARTHROSCOPY Bilateral    Right 2012, left 2014   Family History  Problem Relation Age of Onset   Hypertension Mother    CAD Mother        Died of MI at age 54   Asthma Mother    Pulmonary embolism Mother        x2   Cancer - Prostate Father    Atrial fibrillation Father    Hyperlipidemia Father    Seizures Sister    Migraines Sister    Hypertension Brother    Hypertension Brother    Lung cancer Paternal Grandmother    Breast cancer Neg Hx    Allergic rhinitis Neg Hx    Eczema Neg Hx    Urticaria Neg Hx    Allergies as of 11/18/2021   No Known Allergies      Medication List        Accurate as of November 18, 2021 11:59 PM. If you have any questions, ask your nurse or doctor.          buPROPion 150 MG 24 hr tablet Commonly known as: WELLBUTRIN XL Take 1 tablet (150 mg total) by mouth daily. What changed: when to take this   celecoxib 200 MG capsule Commonly known as: CELEBREX Take 200 mg by mouth 2 (two)  times daily.   ciprofloxacin 750 MG tablet Commonly known as: CIPRO Take 1 tablet (750 mg total) by mouth 2 (two) times daily.   clonazePAM 0.5 MG tablet Commonly known as: KLONOPIN 1/2 tab during the day and 1-2 tabs QHS and What changed:  how much to take how to take this when to take this additional instructions   escitalopram 20 MG tablet Commonly known as: LEXAPRO Take 1 tablet (20 mg total) by mouth at bedtime.   gabapentin 100 MG capsule Commonly known as: NEURONTIN Take 100 mg by mouth every evening.   Linzess 72 MCG capsule Generic drug: linaclotide TAKE 1 CAPSULE BY MOUTH DAILY BEFORE BREAKFAST. What changed: See the new instructions.   NON FORMULARY See admin instructions. BioTrue contact solution- For use with contacts   omeprazole 40 MG capsule Commonly known as: PRILOSEC Take 1 capsule (40 mg total) by mouth daily. What changed: when to take this   ondansetron 4 MG disintegrating tablet Commonly known as: ZOFRAN-ODT Take 1 tablet (4 mg total) by mouth every 8 (eight) hours as needed for nausea or vomiting.        All past medical history, surgical history, allergies, family history, immunizations and medications were updated in the EMR today and reviewed under the history and medication portions of their EMR.      ROS: Negative, with the exception of above mentioned in HPI   Objective:  BP 106/71   Pulse 67   Temp 97.9 F (36.6 C) (Oral)   Ht $R'5\' 4"'Lo$  (1.626 m)   Wt 158 lb (71.7 kg)   SpO2 98%   BMI 27.12 kg/m  Body mass index is 27.12 kg/m. Physical Exam Vitals and  nursing note reviewed.  Constitutional:      General: She is not in acute distress.    Appearance: Normal appearance. She is not ill-appearing, toxic-appearing or diaphoretic.  HENT:     Head: Normocephalic and atraumatic.     Nose: No congestion or rhinorrhea.     Mouth/Throat:     Mouth: Mucous membranes are moist.     Pharynx: No oropharyngeal exudate or posterior  oropharyngeal erythema.  Eyes:     General: No scleral icterus.       Right eye: No discharge.        Left eye: No discharge.     Extraocular Movements: Extraocular movements intact.     Conjunctiva/sclera: Conjunctivae normal.     Pupils: Pupils are equal, round, and reactive to light.  Cardiovascular:     Rate and Rhythm: Normal rate and regular rhythm.  Pulmonary:     Effort: Pulmonary effort is normal. No respiratory distress.     Breath sounds: Normal breath sounds. No wheezing, rhonchi or rales.  Abdominal:     General: Abdomen is flat. Bowel sounds are normal. There is no distension.     Palpations: Abdomen is soft. There is no mass.     Tenderness: There is abdominal tenderness. There is right CVA tenderness and left CVA tenderness. There is no guarding or rebound.     Hernia: No hernia is present.  Musculoskeletal:     Cervical back: Neck supple. No tenderness.     Right lower leg: No edema.     Left lower leg: No edema.  Lymphadenopathy:     Cervical: No cervical adenopathy.  Skin:    General: Skin is warm and dry.     Coloration: Skin is not jaundiced or pale.     Findings: No erythema or rash.  Neurological:     Mental Status: She is alert and oriented to person, place, and time. Mental status is at baseline.     Motor: No weakness.     Gait: Gait normal.  Psychiatric:        Mood and Affect: Mood normal.        Behavior: Behavior normal.        Thought Content: Thought content normal.        Judgment: Judgment normal.       Assessment/Plan: Tashe Purdon is a 56 y.o. female present for OV for Hospital discharge follow up Pyelonephritis/septic shock/recent illness-hospitalization Patient appears weak today.  She does not appear toxic. Vital signs are stable.  Afebrile. Continue Cipro last dose prescribed by inpatient team. Continue to hydrate and take an adequate nutrition daily. Rest if needed.  Serious illness such as septic shock takes time to  recover fully back to baseline. We will collect labs today recheck liver enzymes, urine to ensure treatment success and CBC with differential today to ensure infection is not rebounding. - Comp Met (CMET) - Urinalysis w microscopic + reflex cultur - CBC w/Diff Patient will be called with these results and further plan discussed at that time    Reviewed expectations re: course of current medical issues. Discussed self-management of symptoms. Outlined signs and symptoms indicating need for more acute intervention. Patient verbalized understanding and all questions were answered. Patient received an After-Visit Summary. Any changes in medications were reviewed and patient was provided with updated med list with their AVS.     Orders Placed This Encounter  Procedures   Urine Culture   Comp Met (CMET)  Urinalysis w microscopic + reflex cultur   CBC w/Diff   REFLEXIVE URINE CULTURE     Note is dictated utilizing voice recognition software. Although note has been proof read prior to signing, occasional typographical errors still can be missed. If any questions arise, please do not hesitate to call for verification.   electronically signed by:  Lindsay Pouch, DO  [place]

## 2021-11-19 NOTE — ED Notes (Signed)
Patient transported to X-ray 

## 2021-11-19 NOTE — Telephone Encounter (Signed)
Please inform patient: Her CBC/white blood cells are normal and lower than her hospital discharge.  Therefore, her infection is being treated appropriately.   Her urine collection looks much improved.  There was just a trace of white cells and no bacteria seen.  It is still being sent for culture to be certain.  At this time she does not need additional antibiotics approve these labs. Her hemoglobin has normalized. Her electrolytes are balancing and now has normal potassium and calcium. Her liver enzymes are still elevated and are little higher than at hospital discharge.    -Recommendations: Continue to hydrate, eat 3 small meals a day to make sure obtaining adequate nutrition.  Follow-up in 1 week with provider.  We will monitor your liver enzymes over this time and if they continue to rise we will start further studies into cause.  However since the liver enzymes started to rise with this acute illness I do not expect there to be a different etiology for the liver enzymes being elevated then reaction to her acute illness.  The high dose of Cipro she is on, sometimes can cause liver enzymes to raise temporarily as well.

## 2021-11-19 NOTE — ED Provider Notes (Signed)
[facility] DEPT Provider Note   CSN: [id number] Arrival date & time: 11/19/21  1556     History  Chief Complaint  Patient presents with   Fatigue    Lindsay Hamilton is a 56 y.o. female.  Patient is a 56 year old female who presents with abdominal pain and fatigue.  She has a complex recent history.  She was admitted July 11 to July 13 for urosepsis.  She had pyelonephritis evidence on CT scan.  She was treated with IV antibiotics and discharged on Cipro.  She has still felt bad.  She feels very fatigued.  She has ongoing pain in her right flank.  She says her bilateral back pain was better but she still has pain in her right flank associated with some nausea but no vomiting.  No change in her stools.  No cough or cold symptoms.  No rashes.  No known tick bites.  No urinary symptoms.  She does report that she has had some low-grade fevers up to 100.2.  She says she is had some abdominal issues intermittently for the last month with some intermittent abdominal pain associated with nausea and vomiting.  No diarrhea.  She was seen by her PCP yesterday and had labs that showed mild increase in her LFTs that were mildly elevated during her admission.  Her WBC count was normal.  Her urinalysis did not show suggestions of infection.  It was sent for culture.  She is status postcholecystectomy remotely.  She is concerned that we may be missing something and that she has ongoing infection.       Home Medications Prior to Admission medications   Medication Sig Start Date End Date Taking? Authorizing Provider  ondansetron (ZOFRAN-ODT) 4 MG disintegrating tablet '4mg'$  ODT q4 hours prn nausea/vomit 11/19/21  Yes Malvin Johns, MD  buPROPion (WELLBUTRIN XL) 150 MG 24 hr tablet Take 1 tablet (150 mg total) by mouth daily. Patient taking differently: Take 150 mg by mouth in the morning. 08/19/21   Kuneff, Renee A, DO  celecoxib (CELEBREX) 200 MG capsule Take 200 mg by mouth 2  (two) times daily. 06/12/20   [provider]  ciprofloxacin (CIPRO) 750 MG tablet Take 1 tablet (750 mg total) by mouth 2 (two) times daily. 11/14/21   Danford, Suann Larry, MD  clonazePAM (KLONOPIN) 0.5 MG tablet 1/2 tab during the day and 1-2 tabs QHS and Patient taking differently: Take 0.25-1 mg by mouth See admin instructions. Take 0.25 mg by mouth in the morning and 1 mg in the evening 08/19/21   Kuneff, Renee A, DO  escitalopram (LEXAPRO) 20 MG tablet Take 1 tablet (20 mg total) by mouth at bedtime. 08/19/21   Kuneff, Renee A, DO  gabapentin (NEURONTIN) 100 MG capsule Take 100 mg by mouth every evening. 06/08/20   [provider]  LINZESS 72 MCG capsule TAKE 1 CAPSULE BY MOUTH DAILY BEFORE BREAKFAST. Patient taking differently: Take 72 mcg by mouth daily before breakfast. 07/08/21   Milus Banister, MD  omeprazole (PRILOSEC) 40 MG capsule Take 1 capsule (40 mg total) by mouth daily. Patient taking differently: Take 40 mg by mouth daily before breakfast. 11/21/20   Milus Banister, MD      Allergies    Patient has no known allergies.    Review of Systems   Review of Systems  Constitutional:  Positive for fatigue and fever. Negative for chills and diaphoresis.  HENT:  Negative for congestion, rhinorrhea and sneezing.   Eyes:  Negative.   Respiratory:  Negative for cough, chest tightness and shortness of breath.   Cardiovascular:  Negative for chest pain and leg swelling.  Gastrointestinal:  Positive for abdominal pain and nausea. Negative for blood in stool, diarrhea and vomiting.  Genitourinary:  Positive for flank pain. Negative for difficulty urinating and frequency.  Musculoskeletal:  Negative for back pain.  Skin:  Negative for rash.  Neurological:  Positive for weakness (Generalized) and light-headedness. Negative for speech difficulty, numbness and headaches.    Physical Exam Updated Vital Signs BP 126/87   Pulse (!) 58   Temp 98.4 F (36.9 C) (Oral)   Resp  16   Ht '5\' 4"'$  (1.626 m)   Wt 71.7 kg   SpO2 95%   BMI 27.13 kg/m  Physical Exam Constitutional:      Appearance: She is well-developed.  HENT:     Head: Normocephalic and atraumatic.  Eyes:     Pupils: Pupils are equal, round, and reactive to light.  Cardiovascular:     Rate and Rhythm: Normal rate and regular rhythm.     Heart sounds: Normal heart sounds.  Pulmonary:     Effort: Pulmonary effort is normal. No respiratory distress.     Breath sounds: Normal breath sounds. No wheezing or rales.  Chest:     Chest wall: No tenderness.  Abdominal:     General: Bowel sounds are normal.     Palpations: Abdomen is soft.     Tenderness: There is no abdominal tenderness (Right mid abdomen). There is no guarding or rebound.  Musculoskeletal:        General: Normal range of motion.     Cervical back: Normal range of motion and neck supple.  Lymphadenopathy:     Cervical: No cervical adenopathy.  Skin:    General: Skin is warm and dry.     Findings: No rash.  Neurological:     Mental Status: She is alert and oriented to person, place, and time.     ED Results / Procedures / Treatments   Labs (all labs ordered are listed, but only abnormal results are displayed) Labs Reviewed  COMPREHENSIVE METABOLIC PANEL - Abnormal; Notable for the following components:      Result Value   AST 53 (*)    ALT 103 (*)    All other components within normal limits  CBC WITH DIFFERENTIAL/PLATELET - Abnormal; Notable for the following components:   Hemoglobin 11.5 (*)    HCT 35.5 (*)    Abs Immature Granulocytes 0.09 (*)    All other components within normal limits  URINALYSIS, ROUTINE W REFLEX MICROSCOPIC - Abnormal; Notable for the following components:   Color, Urine COLORLESS (*)    All other components within normal limits  RESP PANEL BY RT-PCR (FLU A&B, COVID) ARPGX2  LACTIC ACID, PLASMA  LIPASE, BLOOD    EKG None  Radiology US Abdomen Complete  Result Date: 11/19/2021 CLINICAL  DATA:  Abdomen pain EXAM: ABDOMEN ULTRASOUND COMPLETE COMPARISON:  CT 11/19/2021 FINDINGS: Gallbladder: Surgically absent Common bile duct: Diameter: 4.4 mm Liver: No focal lesion identified. Within normal limits in parenchymal echogenicity. Portal vein is patent on color Doppler imaging with normal direction of blood flow towards the liver. IVC: No abnormality visualized. Pancreas: Visualized portion unremarkable. Spleen: Size and appearance within normal limits. Right Kidney: Length: 10.5 cm. Echogenicity within normal limits. No mass or hydronephrosis visualized. Left Kidney: Length: 11.3 cm. Echogenicity within normal limits. No mass or hydronephrosis visualized. Abdominal aorta:  No aneurysm visualized. Other findings: None. IMPRESSION: Status post cholecystectomy.  Otherwise negative examination Electronically Signed   By: Donavan Foil M.D.   On: 11/19/2021 20:45   DG Chest 2 View  Result Date: 11/19/2021 CLINICAL DATA:  Continued malaise after recent urosepsis from pyelonephritis. EXAM: CHEST - 2 VIEW COMPARISON:  Chest x-ray dated July 15, 2019. FINDINGS: The heart size and mediastinal contours are within normal limits. Both lungs are clear. The visualized skeletal structures are unremarkable. IMPRESSION: No active cardiopulmonary disease. Electronically Signed   By: Titus Dubin M.D.   On: 11/19/2021 20:36   CT Renal Stone Study  Result Date: 11/19/2021 CLINICAL DATA:  Flank pain, kidney stone suspected. Right lower quadrant pain. Recently admitted for urosepsis related to pyelonephritis. EXAM: CT ABDOMEN AND PELVIS WITHOUT CONTRAST TECHNIQUE: Multidetector CT imaging of the abdomen and pelvis was performed following the standard protocol without IV contrast. RADIATION DOSE REDUCTION: This exam was performed according to the departmental dose-optimization program which includes automated exposure control, adjustment of the mA and/or kV according to patient size and/or use of iterative  reconstruction technique. COMPARISON:  CT examination dated November 12, 2021 FINDINGS: Lower chest: No acute abnormality. Hepatobiliary: No focal liver abnormality is seen. Status post cholecystectomy. No biliary dilatation. Pancreas: Unremarkable. No pancreatic ductal dilatation or surrounding inflammatory changes. Spleen: Normal in size without focal abnormality. Adrenals/Urinary Tract: Adrenal glands are unremarkable. Kidneys are normal, without renal calculi, focal lesion, or hydronephrosis. Mild nonspecific bilateral perinephric fat stranding. No appreciable perinephric fluid collection. Bladder is unremarkable. Stomach/Bowel: Stomach is within normal limits. Appendix appears normal. No evidence of bowel wall thickening, distention, or inflammatory changes. Vascular/Lymphatic: Mild aortic atherosclerosis. No enlarged abdominal or pelvic lymph nodes. Reproductive: Status post hysterectomy. No adnexal masses. Other: No abdominal wall hernia or abnormality. No abdominopelvic ascites. 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 No acute osseous abnormality. IMPRESSION: 1. No evidence of nephrolithiasis or hydronephrosis. No appreciable urinary bladder wall thickening. Evaluation of pyelonephritis is limited on this noncontrast enhanced examination. Urinalysis could be considered for further evaluation if clinically warranted. 2.  Normal appendix.  No evidence of colitis or diverticulitis. 3.  Status post cholecystectomy and hysterectomy. 4. 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 No acute osseous abnormality. Electronically Signed   By: Keane Police D.O.   On: 11/19/2021 17:31    Procedures Procedures    Medications Ordered in ED Medications  lactated ringers bolus 1,000 mL (0 mLs Intravenous Stopped 11/19/21 1931)  ketorolac (TORADOL) 15 MG/ML injection 15 mg (15 mg Intravenous Given 11/19/21 1824)  acetaminophen (TYLENOL) tablet 650 mg (650 mg Oral  Given 11/19/21 2047)    ED Course/ Medical Decision Making/ A&P                           Medical Decision Making Amount and/or Complexity of Data Reviewed Labs: ordered. Radiology: ordered.  Risk OTC drugs. Prescription drug management.   Patient is a 56 year old female who recently was admitted for urosepsis/pyelonephritis.  She said that she just has not felt very well since she has been discharged home.  She has had some low-grade fevers.  Initially it was reported to be up to 102 however she and her husband now stay it has not gotten above 99.  She has some ongoing pain in her right flank.  Labs were obtained.  She has a normal white count.  Her LFTs are mildly elevated which  was previously felt to be related to her recent sepsis.  They are not trending upward.  She is status postcholecystectomy so would not be related to an obstructive process.  CT scan was performed which shows no evidence of ongoing infection.  No new infections.  She has normal appendix.  Her urinalysis is not consistent with infection.  She has family at bedside who is very concerned about her overall and was requesting an abdominal ultrasound as well.  I did not feel that he would add a lot although given that the CT scan was noncontrast, it would be beneficial to look at the kidneys to make sure that there was not evidence of an abscess.  This was performed and shows no acute abnormalities.  No evidence of renal abscess.  I spoke with the radiologist who reviewed the CT scan and was confident that with the ultrasound, it was very reassuring that there was no abscess in the kidneys.  She overall is well-appearing.  She had a chest x-ray which showed no evidence of pneumonia.  COVID/flu was negative.  The chest x-ray was interpreted by me and confirmed by the radiologist.  She was given IV fluids and some Toradol for pain.  Her pain is significantly improved.  She has no ongoing nausea or vomiting.  She was discharged home  in good condition.  She was given a prescription for Zofran.  She is no longer taking the Cipro.  She is currently finished it so I feel it is appropriate at this point to use Zofran.  She will have close follow-up with her primary care doctor.  Return precautions were given.  Final Clinical Impression(s) / ED Diagnoses Final diagnoses:  Other fatigue  Flank pain    Rx / DC Orders ED Discharge Orders          Ordered    ondansetron (ZOFRAN-ODT) 4 MG disintegrating tablet        11/19/21 2221              Malvin Johns, MD 11/19/21 2224

## 2021-11-19 NOTE — Telephone Encounter (Signed)
Patient calling again to speak to [place].  Please call patient as soon as you can.

## 2021-11-19 NOTE — ED Triage Notes (Signed)
Patient here POV from Home.  Endorses being recently Admitted for Urosepsis related to Pyelonephritis. Discharged Thursday. Visited PCP Yesterday for Re-Evaluation as the patient endorses Continued General Malaise, Nausea, ABD Pain, Flank Pain, Chills.  NAD Noted during Triage. A&Ox4. GCS 15. Ambulatory.

## 2021-11-19 NOTE — Telephone Encounter (Signed)
LVM for pt to CB regarding results questions.

## 2021-11-19 NOTE — Telephone Encounter (Signed)
Spoke with pt regarding labs and instructions.   

## 2021-11-19 NOTE — Telephone Encounter (Signed)
Patient called back.  Patient would like Dr. Raoul Pitch to order CT scan so she can get her mind at ease regarding her liver enzymes doubling in 4 days.   Please advise.

## 2021-11-19 NOTE — Telephone Encounter (Signed)
Please advise if CT is something we could order?

## 2021-11-19 NOTE — ED Notes (Signed)
Patient transported to CT 

## 2021-11-19 NOTE — Telephone Encounter (Signed)
Patient requesting to speak to Lindsay Hamilton.  She stated she spoke to her earlier about lab results, etc. Please call [phone]

## 2021-11-19 NOTE — Telephone Encounter (Signed)
Please remind patient she had a CT abdomen pelvis during her hospitalization.  I would not commended repeating that this soon. Although her liver enzymes are increasing, they are still not all that high in consideration she recently was hospitalized and had septic shock.

## 2021-11-20 LAB — COMPREHENSIVE METABOLIC PANEL
AG Ratio: 1.3 (calc) (ref 1.0–2.5)
ALT: 119 U/L — ABNORMAL HIGH (ref 6–29)
AST: 84 U/L — ABNORMAL HIGH (ref 10–35)
Albumin: 3.8 g/dL (ref 3.6–5.1)
Alkaline phosphatase (APISO): 98 U/L (ref 37–153)
BUN: 15 mg/dL (ref 7–25)
CO2: 24 mmol/L (ref 20–32)
Calcium: 9.2 mg/dL (ref 8.6–10.4)
Chloride: 105 mmol/L (ref 98–110)
Creat: 0.69 mg/dL (ref 0.50–1.03)
Globulin: 2.9 g/dL (calc) (ref 1.9–3.7)
Glucose, Bld: 100 mg/dL — ABNORMAL HIGH (ref 65–99)
Potassium: 4.3 mmol/L (ref 3.5–5.3)
Sodium: 141 mmol/L (ref 135–146)
Total Bilirubin: 0.3 mg/dL (ref 0.2–1.2)
Total Protein: 6.7 g/dL (ref 6.1–8.1)

## 2021-11-20 LAB — CBC WITH DIFFERENTIAL/PLATELET
Absolute Monocytes: 313 cells/uL (ref 200–950)
Basophils Absolute: 29 cells/uL (ref 0–200)
Basophils Relative: 0.5 %
Eosinophils Absolute: 133 cells/uL (ref 15–500)
Eosinophils Relative: 2.3 %
HCT: 36.3 % (ref 35.0–45.0)
Hemoglobin: 11.7 g/dL (ref 11.7–15.5)
Lymphs Abs: 2239 cells/uL (ref 850–3900)
MCH: 28 pg (ref 27.0–33.0)
MCHC: 32.2 g/dL (ref 32.0–36.0)
MCV: 86.8 fL (ref 80.0–100.0)
MPV: 9.7 fL (ref 7.5–12.5)
Monocytes Relative: 5.4 %
Neutro Abs: 3086 cells/uL (ref 1500–7800)
Neutrophils Relative %: 53.2 %
Platelets: 386 10*3/uL (ref 140–400)
RBC: 4.18 10*6/uL (ref 3.80–5.10)
RDW: 13 % (ref 11.0–15.0)
Total Lymphocyte: 38.6 %
WBC: 5.8 10*3/uL (ref 3.8–10.8)

## 2021-11-20 LAB — URINE CULTURE
MICRO NUMBER:: 13660320
Result:: NO GROWTH
SPECIMEN QUALITY:: ADEQUATE

## 2021-11-20 LAB — URINALYSIS W MICROSCOPIC + REFLEX CULTURE
Bacteria, UA: NONE SEEN /HPF
Bilirubin Urine: NEGATIVE
Glucose, UA: NEGATIVE
Hgb urine dipstick: NEGATIVE
Hyaline Cast: NONE SEEN /LPF
Ketones, ur: NEGATIVE
Nitrites, Initial: NEGATIVE
Protein, ur: NEGATIVE
RBC / HPF: NONE SEEN /HPF (ref 0–2)
Specific Gravity, Urine: 1.008 (ref 1.001–1.035)
Squamous Epithelial / HPF: NONE SEEN /HPF (ref <=5)
pH: 6.5 (ref 5.0–8.0)

## 2021-11-20 LAB — CULTURE INDICATED

## 2021-11-20 NOTE — Telephone Encounter (Signed)
Pt went to ED and completed US.

## 2021-11-26 ENCOUNTER — Telehealth: Payer: Self-pay | Admitting: Family Medicine

## 2021-11-26 ENCOUNTER — Emergency Department (HOSPITAL_COMMUNITY)
Admission: EM | Admit: 2021-11-26 | Discharge: 2021-11-27 | Disposition: A | Discharge status: Home / Self Care | Payer: Federal, State, Local not specified - PPO | Attending: Emergency Medicine | Admitting: Emergency Medicine

## 2021-11-26 ENCOUNTER — Ambulatory Visit: Payer: Federal, State, Local not specified - PPO | Admitting: Family Medicine

## 2021-11-26 ENCOUNTER — Encounter: Payer: Self-pay | Admitting: Family Medicine

## 2021-11-26 ENCOUNTER — Encounter (HOSPITAL_COMMUNITY): Payer: Self-pay

## 2021-11-26 VITALS — BP 99/66 | HR 70 | Temp 98.0°F | Ht 64.0 in | Wt 158.0 lb

## 2021-11-26 DIAGNOSIS — R748 Abnormal levels of other serum enzymes: Secondary | ICD-10-CM | POA: Insufficient documentation

## 2021-11-26 DIAGNOSIS — R7401 Elevation of levels of liver transaminase levels: Secondary | ICD-10-CM | POA: Insufficient documentation

## 2021-11-26 DIAGNOSIS — N12 Tubulo-interstitial nephritis, not specified as acute or chronic: Secondary | ICD-10-CM | POA: Diagnosis not present

## 2021-11-26 DIAGNOSIS — R112 Nausea with vomiting, unspecified: Secondary | ICD-10-CM | POA: Diagnosis not present

## 2021-11-26 DIAGNOSIS — R1013 Epigastric pain: Secondary | ICD-10-CM

## 2021-11-26 DIAGNOSIS — A419 Sepsis, unspecified organism: Secondary | ICD-10-CM

## 2021-11-26 DIAGNOSIS — K760 Fatty (change of) liver, not elsewhere classified: Secondary | ICD-10-CM | POA: Diagnosis not present

## 2021-11-26 DIAGNOSIS — N3289 Other specified disorders of bladder: Secondary | ICD-10-CM | POA: Diagnosis not present

## 2021-11-26 DIAGNOSIS — E871 Hypo-osmolality and hyponatremia: Secondary | ICD-10-CM | POA: Diagnosis not present

## 2021-11-26 DIAGNOSIS — R6520 Severe sepsis without septic shock: Secondary | ICD-10-CM | POA: Diagnosis not present

## 2021-11-26 HISTORY — DX: Tubulo-interstitial nephritis, not specified as acute or chronic: N12

## 2021-11-26 LAB — COMPREHENSIVE METABOLIC PANEL
ALT: 41 U/L — ABNORMAL HIGH (ref 0–35)
ALT: 47 U/L — ABNORMAL HIGH (ref 0–44)
AST: 18 U/L (ref 0–37)
AST: 22 U/L (ref 15–41)
Albumin: 4 g/dL (ref 3.5–5.2)
Albumin: 4.1 g/dL (ref 3.5–5.0)
Alkaline Phosphatase: 67 U/L (ref 39–117)
Alkaline Phosphatase: 64 U/L (ref 38–126)
Anion gap: 9 (ref 5–15)
BUN: 16 mg/dL (ref 6–23)
BUN: 13 mg/dL (ref 6–20)
CO2: 32 mEq/L (ref 19–32)
CO2: 23 mmol/L (ref 22–32)
Calcium: 9.7 mg/dL (ref 8.4–10.5)
Calcium: 10.1 mg/dL (ref 8.9–10.3)
Chloride: 104 mEq/L (ref 96–112)
Chloride: 109 mmol/L (ref 98–111)
Creatinine, Ser: 0.65 mg/dL (ref 0.40–1.20)
Creatinine, Ser: 0.6 mg/dL (ref 0.44–1.00)
GFR, Estimated: >60 mL/min (ref >60)
GFR: 99.03 mL/min (ref >60.00)
Glucose, Bld: 94 mg/dL (ref 70–99)
Glucose, Bld: 120 mg/dL — ABNORMAL HIGH (ref 70–99)
Potassium: 4.3 mEq/L (ref 3.5–5.1)
Potassium: 3.9 mmol/L (ref 3.5–5.1)
Sodium: 143 mEq/L (ref 135–145)
Sodium: 141 mmol/L (ref 135–145)
Total Bilirubin: 0.4 mg/dL (ref 0.2–1.2)
Total Bilirubin: 0.5 mg/dL (ref 0.3–1.2)
Total Protein: 6.9 g/dL (ref 6.0–8.3)
Total Protein: 8.3 g/dL — ABNORMAL HIGH (ref 6.5–8.1)

## 2021-11-26 LAB — CBC WITH DIFFERENTIAL/PLATELET
Abs Immature Granulocytes: 0.02 10*3/uL (ref 0.00–0.07)
Basophils Absolute: 0 10*3/uL (ref 0.0–0.1)
Basophils Relative: 0 %
Eosinophils Absolute: 0 10*3/uL (ref 0.0–0.5)
Eosinophils Relative: 0 %
HCT: 42.4 % (ref 36.0–46.0)
Hemoglobin: 13.4 g/dL (ref 12.0–15.0)
Immature Granulocytes: 0 %
Lymphocytes Relative: 13 %
Lymphs Abs: 0.7 10*3/uL (ref 0.7–4.0)
MCH: 28 pg (ref 26.0–34.0)
MCHC: 31.6 g/dL (ref 30.0–36.0)
MCV: 88.7 fL (ref 80.0–100.0)
Monocytes Absolute: 0 10*3/uL — ABNORMAL LOW (ref 0.1–1.0)
Monocytes Relative: 1 %
Neutro Abs: 4.6 10*3/uL (ref 1.7–7.7)
Neutrophils Relative %: 86 %
Platelets: 471 10*3/uL — ABNORMAL HIGH (ref 150–400)
RBC: 4.78 MIL/uL (ref 3.87–5.11)
RDW: 14.4 % (ref 11.5–15.5)
WBC: 5.4 10*3/uL (ref 4.0–10.5)
nRBC: 0 % (ref 0.0–0.2)

## 2021-11-26 LAB — URINALYSIS, ROUTINE W REFLEX MICROSCOPIC
Bilirubin Urine: NEGATIVE
Glucose, UA: NEGATIVE mg/dL
Hgb urine dipstick: NEGATIVE
Ketones, ur: NEGATIVE mg/dL
Nitrite: NEGATIVE
Protein, ur: NEGATIVE mg/dL
Specific Gravity, Urine: 1.014 (ref 1.005–1.030)
pH: 7 (ref 5.0–8.0)

## 2021-11-26 LAB — LIPASE, BLOOD: Lipase: 78 U/L — ABNORMAL HIGH (ref 11–51)

## 2021-11-26 LAB — LIPASE: Lipase: 1254 U/L — ABNORMAL HIGH (ref 11.0–59.0)

## 2021-11-26 MED ORDER — MORPHINE SULFATE (PF) 4 MG/ML IV SOLN
4.0000 mg | Freq: Once | INTRAVENOUS | Status: AC
  Administered 2021-11-27: 4 mg via INTRAVENOUS
  Filled 2021-11-26: qty 1

## 2021-11-26 MED ORDER — SODIUM CHLORIDE 0.9 % IV BOLUS
1000.0000 mL | Freq: Once | INTRAVENOUS | Status: AC
  Administered 2021-11-27: 1000 mL via INTRAVENOUS

## 2021-11-26 MED ORDER — ONDANSETRON HCL 4 MG/2ML IJ SOLN
4.0000 mg | Freq: Once | INTRAMUSCULAR | Status: AC
  Administered 2021-11-26: 4 mg via INTRAMUSCULAR

## 2021-11-26 MED ORDER — ONDANSETRON HCL 4 MG/2ML IJ SOLN
4.0000 mg | Freq: Once | INTRAMUSCULAR | Status: AC
  Administered 2021-11-27: 4 mg via INTRAVENOUS
  Filled 2021-11-26: qty 2

## 2021-11-26 NOTE — Telephone Encounter (Signed)
Spoke with pt regarding labs and instructions.   

## 2021-11-26 NOTE — ED Provider Triage Note (Signed)
Emergency Medicine Provider Triage Evaluation Note  Lindsay Hamilton , a 56 y.o. female  was evaluated in triage.  Pt complains of emesis, elevated lipase.  Recently admitted for urosepsis with septic shock.  Was starting to feel better until yesterday.  Developed some subjective chills, nausea, vomiting and follow-up appoint with PCP today.  Obtained new labs which showed lipase of 1200 which is elevated from prior.  She denies any prior history of pancreatitis.  She has had persistent NBNB emesis.  No dysuria or hematuria.  No flank pain.  Review of Systems  Positive: N/V Negative:   Physical Exam  BP 126/90   Pulse 92   Temp 98.2 F (36.8 C) (Oral)   Resp 18   Ht '5\' 4"'$  (1.626 m)   Wt 69.6 kg   SpO2 95%   BMI 26.35 kg/m  Gen:   Awake, no distress   Resp:  Normal effort  MSK:   Moves extremities without difficulty  Other:    Medical Decision Making  Medically screening exam initiated at 6:51 PM.  Appropriate orders placed.  Lindsay Hamilton was informed that the remainder of the evaluation will be completed by another provider, this initial triage assessment does not replace that evaluation, and the importance of remaining in the ED until their evaluation is complete.  vomiting, abnormal lab   Jory Tanguma A, PA-C 11/26/21 1852

## 2021-11-26 NOTE — Telephone Encounter (Signed)
Please call patient Good news is her liver enzymes are almost back to normal.  The AST is back to normal and the ALT is almost back to normal at 41. Her electrolytes are normal. The unfortunate news is that her lipase, which is the pancreatic enzyme is now above 1200 and it was 46 1 week ago.  This means she has an inflamed pancreas, and this is what is causing her nausea and vomiting this morning.  With this level of lipase she unfortunately will need emergent care.  Initial treatment is IV fluids and pain control.  Without treatment, she could go on to have another infection and become septic.

## 2021-11-26 NOTE — ED Triage Notes (Signed)
Pt presents with c/o abnormal labs. Pt reports she was recently admitted for septic shock. Pt reports she had a follow-up visit with her PCP today and had some blood work done and was told to come back to the hospital for very elevated lipase levels. Pt reporting abdominal pain and vomiting.

## 2021-11-26 NOTE — ED Notes (Signed)
Pt given specimen cup and asked to provide urine sample if possible.

## 2021-11-26 NOTE — Patient Instructions (Signed)
No follow-ups on file.        Great to see you today.  I have refilled the medication(s) we provide.   If labs were collected, we will inform you of lab results once received either by echart message or telephone call.   - echart message- for normal results that have been seen by the patient already.   - telephone call: abnormal results or if patient has not viewed results in their echart.  

## 2021-11-26 NOTE — Progress Notes (Signed)
Lindsay Hamilton , Sep 12, 1965, 56 y.o., female MRN: [id number] Patient Care Team    Relationship Specialty Notifications Start End  [name], DO PCP - General Family Medicine  04/15/19   Sharia Reeve, MD Referring Physician Psychiatry  09/14/13   Garnet Sierras, DO Consulting Physician Allergy  03/17/19   Clarene Essex, MD Consulting Physician Gastroenterology  04/15/19   Vickey Huger, MD Consulting Physician Orthopedic Surgery  04/15/19   Donia Ast, [place]  Orthopedic Surgery  04/15/19   Roel Cluck, MD Referring Physician Ophthalmology  04/15/19   Lelon Perla, MD Consulting Physician Cardiology  04/15/19   Kathrynn Ducking, MD (Inactive) Consulting Physician Neurology  04/15/19   Arvella Nigh, MD Consulting Physician Obstetrics and Gynecology  04/15/19   Juluis Rainier  Optometry  04/15/19   Clabe Seal, DMD  Orthodontics  04/15/19     Chief Complaint  Patient presents with   LFTs    Pt is fasting     Subjective:  Lindsay Hamilton  is a 56 y.o. female presents for follow up on pyelonephritis illness with sepsis. She was admitted 11/12/2021 for primary diagnosis septic shock. Lindsay Hamilton was discharged on 11/14/2021 to home.  Patients hospital follow-up appointment, she continued to feel fatigued and had a decreased appetite.  Had reported a low-grade temperature after hospital discharge.  She had elevated liver enzymes during her hospital stay, likely secondary to the septic shock.  Repeat liver enzymes during hospital follow-up remained elevated, WBC normal, urinalysis normal.   She was seen in the ED thereafter with concerns of liver functions remaining elevated and low-grade temperature.  Repeat labs were collected which showed stable liver enzymes that were starting to trend down, repeat abdominal CT was reassuring for resolved infection and white count was normal.  Chest x-ray was unremarkable.  Abdominal ultrasound was unremarkable.  Close  follow-up arranged with PCP Today patient reports she is seeing some improvement. She is tolerating PO. She did start to vomit at 1:30am this morning. This was new for her.  Last BM 2 days ago. Normal  Recent Labs  Lab 11/19/21 1715  HGB 11.5*  HCT 35.5*  WBC 7.2  PLT 379      Latest Ref Rng & Units 11/19/2021    5:15 PM 11/18/2021    3:54 PM 11/14/2021    3:17 AM  CMP  Glucose 70 - 99 mg/dL 98  100  95   BUN 6 - 20 mg/dL _0 Creatinine 0.44 - 1.00 mg/dL 0.66  0.69  0.61   Sodium 135 - 145 mmol/L 140  141  142   Potassium 3.5 - 5.1 mmol/L 3.9  4.3  3.4   Chloride 98 - 111 mmol/L 103  105  111   CO2 22 - 32 mmol/L _1 Calcium 8.9 - 10.3 mg/dL 9.8  9.2  8.5   Total Protein 6.5 - 8.1 g/dL 6.9  6.7  6.1   Total Bilirubin 0.3 - 1.2 mg/dL 0.3  0.3  0.7   Alkaline Phos 38 - 126 U/L 76   102   AST 15 - 41 U/L 53  84  43   ALT 0 - 44 U/L 103  119  73       US Abdomen Complete  Result Date: 11/19/2021 CLINICAL DATA:  Abdomen pain EXAM: ABDOMEN ULTRASOUND COMPLETE COMPARISON:  CT 11/19/2021 FINDINGS: Gallbladder:  Surgically absent Common bile duct: Diameter: 4.4 mm Liver: No focal lesion identified. Within normal limits in parenchymal echogenicity. Portal vein is patent on color Doppler imaging with normal direction of blood flow towards the liver. IVC: No abnormality visualized. Pancreas: Visualized portion unremarkable. Spleen: Size and appearance within normal limits. Right Kidney: Length: 10.5 cm. Echogenicity within normal limits. No mass or hydronephrosis visualized. Left Kidney: Length: 11.3 cm. Echogenicity within normal limits. No mass or hydronephrosis visualized. Abdominal aorta: No aneurysm visualized. Other findings: None. IMPRESSION: Status post cholecystectomy.  Otherwise negative examination Electronically Signed   By: Donavan Foil M.D.   On: 11/19/2021 20:45   DG Chest 2 View  Result Date: 11/19/2021 CLINICAL DATA:  Continued malaise after recent  urosepsis from pyelonephritis. EXAM: CHEST - 2 VIEW COMPARISON:  Chest x-ray dated July 15, 2019. FINDINGS: The heart size and mediastinal contours are within normal limits. Both lungs are clear. The visualized skeletal structures are unremarkable. IMPRESSION: No active cardiopulmonary disease. Electronically Signed   By: Titus Dubin M.D.   On: 11/19/2021 20:36   CT Renal Stone Study  Result Date: 11/19/2021 CLINICAL DATA:  Flank pain, kidney stone suspected. Right lower quadrant pain. Recently admitted for urosepsis related to pyelonephritis. EXAM: CT ABDOMEN AND PELVIS WITHOUT CONTRAST TECHNIQUE: Multidetector CT imaging of the abdomen and pelvis was performed following the standard protocol without IV contrast. RADIATION DOSE REDUCTION: This exam was performed according to the departmental dose-optimization program which includes automated exposure control, adjustment of the mA and/or kV according to patient size and/or use of iterative reconstruction technique. COMPARISON:  CT examination dated November 12, 2021 FINDINGS: Lower chest: No acute abnormality. Hepatobiliary: No focal liver abnormality is seen. Status post cholecystectomy. No biliary dilatation. Pancreas: Unremarkable. No pancreatic ductal dilatation or surrounding inflammatory changes. Spleen: Normal in size without focal abnormality. Adrenals/Urinary Tract: Adrenal glands are unremarkable. Kidneys are normal, without renal calculi, focal lesion, or hydronephrosis. Mild nonspecific bilateral perinephric fat stranding. No appreciable perinephric fluid collection. Bladder is unremarkable. Stomach/Bowel: Stomach is within normal limits. Appendix appears normal. No evidence of bowel wall thickening, distention, or inflammatory changes. Vascular/Lymphatic: Mild aortic atherosclerosis. No enlarged abdominal or pelvic lymph nodes. Reproductive: Status post hysterectomy. No adnexal masses. Other: No abdominal wall hernia or abnormality. No  abdominopelvic ascites. 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 No acute osseous abnormality. IMPRESSION: 1. No evidence of nephrolithiasis or hydronephrosis. No appreciable urinary bladder wall thickening. Evaluation of pyelonephritis is limited on this noncontrast enhanced examination. Urinalysis could be considered for further evaluation if clinically warranted. 2.  Normal appendix.  No evidence of colitis or diverticulitis. 3.  Status post cholecystectomy and hysterectomy. 4. 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 No acute osseous abnormality. Electronically Signed   By: Keane Police D.O.   On: 11/19/2021 17:31   CT ABDOMEN PELVIS W CONTRAST  Result Date: 11/12/2021 CLINICAL DATA:  Abdominal pain EXAM: CT ABDOMEN AND PELVIS WITH CONTRAST TECHNIQUE: Multidetector CT imaging of the abdomen and pelvis was performed using the standard protocol following bolus administration of intravenous contrast. RADIATION DOSE REDUCTION: This exam was performed according to the departmental dose-optimization program which includes automated exposure control, adjustment of the mA and/or kV according to patient size and/or use of iterative reconstruction technique. CONTRAST:  16mL OMNIPAQUE IOHEXOL 300 MG/ML  SOLN COMPARISON:  CT abdomen and pelvis dated July 29, 2020 FINDINGS: Lower chest: Small hiatal hernia.  No acute abnormality. Hepatobiliary: No focal liver abnormality is  seen. Status post cholecystectomy. No biliary dilatation. Pancreas: Unremarkable. No pancreatic ductal dilatation or surrounding inflammatory changes. Spleen: Normal in size without focal abnormality. Adrenals/Urinary Tract: Bilateral adrenal glands are unremarkable. No hydronephrosis, nephrolithiasis or suspicious renal lesions. Heterogeneous enhancement of the bilateral kidneys is seen on delayed imaging. Mild bilateral urothelial enhancement. Bladder wall thickening with perivesicular fat  stranding. Stomach/Bowel: Stomach is within normal limits. Appendix appears normal. No evidence of bowel wall thickening, distention, or inflammatory changes. Vascular/Lymphatic: Aortic atherosclerosis. No enlarged abdominal or pelvic lymph nodes. Reproductive: Status post hysterectomy. No adnexal masses. Other: No abdominal wall hernia or abnormality. No abdominopelvic ascites. Musculoskeletal: No acute or significant osseous findings. IMPRESSION: 1. Heterogeneous enhancement of the bilateral kidneys on delayed imaging, mild bilateral urothelial enhancement and bladder wall thickening, findings can be seen in the setting of infection. Recommend correlation with urinalysis. 2.  Aortic Atherosclerosis (ICD10-I70.0). Electronically Signed   By: Yetta Glassman M.D.   On: 11/12/2021 07:57        08/19/2021    2:37 PM 03/04/2021    2:08 PM 01/28/2021    9:19 AM 08/08/2020   10:37 AM 01/27/2020    1:11 PM  Depression screen PHQ 2/9  Decreased Interest 2 0 0  0  Down, Depressed, Hopeless 2 0 1 1 0  PHQ - 2 Score 4 0 1 1 0  Altered sleeping _0 0  Tired, decreased energy _1 0  Change in appetite 0  0 1 0  Feeling bad or failure about yourself  0  0 0 0  Trouble concentrating 3  0 0 0  Moving slowly or fidgety/restless _2 0  Suicidal thoughts 0  0 0 0  PHQ-9 Score _3 0    No Known Allergies Social History   Tobacco Use   Smoking status: Former    Types: Cigarettes    Quit date: 1997    Years since quitting: 26.5   Smokeless tobacco: Never  Substance Use Topics   Alcohol use: Yes    Comment: social   Past Medical History:  Diagnosis Date   Alcohol abuse, in remission    Depression    Head injury    Headaches due to old head injury    Ingrowing nail 02/20/2020   Insomnia 09/14/2013   Migraine without aura, without mention of intractable migraine without mention of status migrainosus 09/14/2013   Obese    Pancreatitis    Pseudoseizures    Right upper quadrant pain  06/26/2020   Tubular adenoma of colon 2018   Past Surgical History:  Procedure Laterality Date   ABDOMINAL HYSTERECTOMY  2009   Partial   CARPAL TUNNEL RELEASE  1998   CESAREAN SECTION     x2-1992, 1997   CHOLECYSTECTOMY  2002   COLONOSCOPY W/ BIOPSIES  05/2016   tubular adenoma   GREAT TOE ARTHRODESIS, METATARSALPHALANGEAL JOINT Right 07/17/2018   KNEE ARTHROSCOPY     bilateral, 3 right and 3 left   SHOULDER ARTHROSCOPY Bilateral    Right 2012, left 2014   Family History  Problem Relation Age of Onset   Hypertension Mother    CAD Mother        Died of MI at age 49   Asthma Mother    Pulmonary embolism Mother        x2   Cancer - Prostate Father    Atrial fibrillation Father    Hyperlipidemia Father  Seizures Sister    Migraines Sister    Hypertension Brother    Hypertension Brother    Lung cancer Paternal Grandmother    Breast cancer Neg Hx    Allergic rhinitis Neg Hx    Eczema Neg Hx    Urticaria Neg Hx    Allergies as of 11/26/2021   No Known Allergies      Medication List        Accurate as of November 26, 2021  9:18 AM. If you have any questions, ask your nurse or doctor.          STOP taking these medications    ciprofloxacin 750 MG tablet Commonly known as: CIPRO Stopped by: Howard Pouch, DO       TAKE these medications    buPROPion 150 MG 24 hr tablet Commonly known as: WELLBUTRIN XL Take 1 tablet (150 mg total) by mouth daily. What changed: when to take this   celecoxib 200 MG capsule Commonly known as: CELEBREX Take 200 mg by mouth 2 (two) times daily.   clonazePAM 0.5 MG tablet Commonly known as: KLONOPIN 1/2 tab during the day and 1-2 tabs QHS and What changed:  how much to take how to take this when to take this additional instructions   escitalopram 20 MG tablet Commonly known as: LEXAPRO Take 1 tablet (20 mg total) by mouth at bedtime.   gabapentin 100 MG capsule Commonly known as: NEURONTIN Take 100 mg by mouth  every evening.   Linzess 72 MCG capsule Generic drug: linaclotide TAKE 1 CAPSULE BY MOUTH DAILY BEFORE BREAKFAST. What changed: See the new instructions.   omeprazole 40 MG capsule Commonly known as: PRILOSEC Take 1 capsule (40 mg total) by mouth daily. What changed: when to take this   ondansetron 4 MG disintegrating tablet Commonly known as: ZOFRAN-ODT 12m ODT q4 hours prn nausea/vomit        All past medical history, surgical history, allergies, family history, immunizations and medications were updated in the EMR today and reviewed under the history and medication portions of their EMR.     Review of Systems  Constitutional:  Positive for malaise/fatigue and weight loss. Negative for chills and fever.  Respiratory:  Negative for cough.   Gastrointestinal:  Positive for abdominal pain, nausea and vomiting. Negative for blood in stool, constipation, diarrhea, heartburn and melena.  Genitourinary:  Positive for flank pain. Negative for dysuria, frequency, hematuria and urgency.  Skin:  Negative for rash.  Neurological:  Negative for dizziness, weakness and headaches.    ROS: Negative, with the exception of above mentioned in HPI   Objective:  BP 99/66   Pulse 70   Temp 98 F (36.7 C) (Oral)   Ht _0  (1.626 m)   Wt 158 lb (71.7 kg)   SpO2 98%   BMI 27.12 kg/m  Body mass index is 27.12 kg/m. Physical Exam Vitals and nursing note reviewed.  Constitutional:      General: She is not in acute distress.    Appearance: Normal appearance. She is not ill-appearing, toxic-appearing or diaphoretic.  HENT:     Head: Normocephalic and atraumatic.     Nose: No congestion or rhinorrhea.     Mouth/Throat:     Mouth: Mucous membranes are moist.     Pharynx: No oropharyngeal exudate or posterior oropharyngeal erythema.  Eyes:     General: No scleral icterus.       Right eye: No discharge.  Left eye: No discharge.     Extraocular Movements: Extraocular movements  intact.     Conjunctiva/sclera: Conjunctivae normal.     Pupils: Pupils are equal, round, and reactive to light.  Cardiovascular:     Rate and Rhythm: Normal rate and regular rhythm.  Pulmonary:     Effort: Pulmonary effort is normal. No respiratory distress.     Breath sounds: Normal breath sounds. No wheezing, rhonchi or rales.  Abdominal:     General: Abdomen is flat. Bowel sounds are normal. There is no distension.     Palpations: Abdomen is soft. There is no mass.     Tenderness: There is no abdominal tenderness. There is no right CVA tenderness, left CVA tenderness, guarding or rebound.     Hernia: No hernia is present.     Comments: Belching, gassy  Musculoskeletal:     Cervical back: Neck supple. No tenderness.     Right lower leg: No edema.     Left lower leg: No edema.  Lymphadenopathy:     Cervical: No cervical adenopathy.  Skin:    General: Skin is warm and dry.     Coloration: Skin is not jaundiced or pale.     Findings: No erythema or rash.  Neurological:     Mental Status: She is alert and oriented to person, place, and time. Mental status is at baseline.     Motor: No weakness.     Gait: Gait normal.  Psychiatric:        Mood and Affect: Mood normal.        Behavior: Behavior normal.        Thought Content: Thought content normal.        Judgment: Judgment normal.     Assessment/Plan: Lindsay Hamilton is a 56 y.o. female present for OV for Hospital discharge follow up Pyelonephritis/septic shock/nausea/vomit Slow improvement- new nausea w/ vomit since this morning.  IM zofran 4 mg provided today Cut back on celebrex for now- maybe causing gastritis/GI upset with decreased oral intake with illness.  Continue PPI Continue to hydrate and take an adequate nutrition daily. Rest if needed.  Serious illness such as septic shock takes time to recover fully back to baseline. Cmp, lipase, hep panel collected today.  F/u dependent on lab results.       Reviewed expectations re: course of current medical issues. Discussed self-management of symptoms. Outlined signs and symptoms indicating need for more acute intervention. Patient verbalized understanding and all questions were answered. Patient received an After-Visit Summary. Any changes in medications were reviewed and patient was provided with updated med list with their AVS.     Orders Placed This Encounter  Procedures   Comp Met (CMET)   Hepatitis, Acute   Lipase     Note is dictated utilizing voice recognition software. Although note has been proof read prior to signing, occasional typographical errors still can be missed. If any questions arise, please do not hesitate to call for verification.   electronically signed by:  Howard Pouch, DO  [place]

## 2021-11-27 ENCOUNTER — Emergency Department (HOSPITAL_COMMUNITY): Payer: Federal, State, Local not specified - PPO

## 2021-11-27 ENCOUNTER — Other Ambulatory Visit: Payer: Self-pay | Admitting: Gastroenterology

## 2021-11-27 DIAGNOSIS — N3289 Other specified disorders of bladder: Secondary | ICD-10-CM | POA: Diagnosis not present

## 2021-11-27 DIAGNOSIS — K760 Fatty (change of) liver, not elsewhere classified: Secondary | ICD-10-CM | POA: Diagnosis not present

## 2021-11-27 LAB — HEPATITIS PANEL, ACUTE
Hep A IgM: NONREACTIVE
Hep B C IgM: NONREACTIVE
Hepatitis B Surface Ag: NONREACTIVE
Hepatitis C Ab: NONREACTIVE

## 2021-11-27 MED ORDER — SODIUM CHLORIDE (PF) 0.9 % IJ SOLN
INTRAMUSCULAR | Status: AC
  Filled 2021-11-27: qty 50

## 2021-11-27 MED ORDER — IOHEXOL 300 MG/ML  SOLN
100.0000 mL | Freq: Once | INTRAMUSCULAR | Status: AC | PRN
  Administered 2021-11-27: 100 mL via INTRAVENOUS

## 2021-11-27 NOTE — Telephone Encounter (Signed)
Patient went to ED as instructed by Dr. Raoul Pitch.  Patient states she had a horrible experience, they gave her IV fluids, blood work, and pain meds, then sent her home. She  states she was there close to 12 hours. She said labs done here, and what was done at hospital were not the same results, and is requesting explanation on why and what she should do next. She states she will not go back to a [facility] facility.  Patient is requesting a call back this morning.  Sending to clinical team as high priority.  Please call patient [phone]

## 2021-11-27 NOTE — Discharge Instructions (Addendum)
You came to the emergency department today to be evaluated for your abdominal pain, nausea, and vomiting.  Your repeat lab results show that your lipase had improved significantly.  The CT scan of your abdomen pelvis did not show any acute abnormalities.  Your symptoms improved after receiving medications in the emergency department.  Please follow-up closely with your primary care doctor for repeat evaluation.  Get help right away if: Your pain does not go away as soon as your health care provider told you to expect. You cannot stop vomiting. Your pain is only in areas of the abdomen, such as the right side or the left lower portion of the abdomen. Pain on the right side could be caused by appendicitis. You have bloody or black stools, or stools that look like tar. You have severe pain, cramping, or bloating in your abdomen. You have signs of dehydration, such as: Dark urine, very little urine, or no urine. Cracked lips. Dry mouth. Sunken eyes. Sleepiness. Weakness. You have trouble breathing or chest pain.

## 2021-11-27 NOTE — Telephone Encounter (Signed)
Pt informed of providers recommendations and sched for friday.

## 2021-11-27 NOTE — ED Provider Notes (Signed)
[facility] DEPT Provider Note   CSN: [id number] Arrival date & time: 11/26/21  1754     History  Chief Complaint  Patient presents with   Abnormal Lab    Lindsay Hamilton is a 56 y.o. female with a history of of migraines, pseudoseizures, pancreatitis, status post cholecystectomy, cesarean section x2, abdominal hysterectomy.  Presents to the emergency department with chief complaint of elevated lipase, nausea, vomiting, and epigastric abdominal pain for  Patient states that this morning approximately 0 130 she began having epigastric abdominal pain, nausea, and vomiting.  Patient reports that she has vomited 5 times since then.  Patient describes emesis as stomach contents and bilious.  Denies any hematemesis or coffee-ground emesis.  Patient states that she has had ODT Zofran with last dose at 10 AM with improvement in her nausea.    Patient reports that pain is located to her epigastric region area and radiates around to her right upper quadrant and flank.  Patient reports that pain has been constant since starting.  At present patient rates pain 6/10 on the pain scale.  Pain is worse with touch.  Additionally patient endorses chills.  Denies any illicit drug use or frequent NSAID use.  Patient does report that she drinks socially but has not had any alcohol recently.     Abnormal Lab      Home Medications Prior to Admission medications   Medication Sig Start Date End Date Taking? Authorizing Provider  buPROPion (WELLBUTRIN XL) 150 MG 24 hr tablet Take 1 tablet (150 mg total) by mouth daily. Patient taking differently: Take 150 mg by mouth in the morning. 08/19/21   Kuneff, Renee A, DO  celecoxib (CELEBREX) 200 MG capsule Take 200 mg by mouth 2 (two) times daily. 06/12/20   [provider]  clonazePAM (KLONOPIN) 0.5 MG tablet 1/2 tab during the day and 1-2 tabs QHS and Patient taking differently: Take 0.25-1 mg by mouth See admin  instructions. Take 0.25 mg by mouth in the morning and 1 mg in the evening 08/19/21   Kuneff, Renee A, DO  escitalopram (LEXAPRO) 20 MG tablet Take 1 tablet (20 mg total) by mouth at bedtime. 08/19/21   Kuneff, Renee A, DO  gabapentin (NEURONTIN) 100 MG capsule Take 100 mg by mouth every evening. 06/08/20   [provider]  LINZESS 72 MCG capsule TAKE 1 CAPSULE BY MOUTH DAILY BEFORE BREAKFAST. Patient taking differently: Take 72 mcg by mouth daily before breakfast. 07/08/21   Milus Banister, MD  omeprazole (PRILOSEC) 40 MG capsule Take 1 capsule (40 mg total) by mouth daily. Patient taking differently: Take 40 mg by mouth daily before breakfast. 11/21/20   Milus Banister, MD  ondansetron (ZOFRAN-ODT) 4 MG disintegrating tablet '4mg'$  ODT q4 hours prn nausea/vomit 11/19/21   Malvin Johns, MD      Allergies    Patient has no known allergies.    Review of Systems   Review of Systems  Constitutional:  Positive for chills. Negative for fever.  Respiratory:  Negative for shortness of breath.   Cardiovascular:  Negative for chest pain.  Gastrointestinal:  Positive for abdominal pain, nausea and vomiting. Negative for abdominal distention, anal bleeding, blood in stool, constipation, diarrhea and rectal pain.  Genitourinary:  Negative for difficulty urinating, dysuria, flank pain, frequency, hematuria, urgency, vaginal bleeding, vaginal discharge and vaginal pain.  Musculoskeletal:  Negative for back pain and neck pain.  Skin:  Negative for color change and rash.  Neurological:  Negative for dizziness, syncope, light-headedness and headaches.  Psychiatric/Behavioral:  Negative for confusion.     Physical Exam Updated Vital Signs BP 118/78   Pulse (!) 57   Temp 97.8 F (36.6 C) (Oral)   Resp 18   Ht '5\' 4"'$  (1.626 m)   Wt 69.6 kg   SpO2 93%   BMI 26.35 kg/m  Physical Exam Vitals and nursing note reviewed.  Constitutional:      General: She is not in acute distress.    Appearance:  She is not ill-appearing, toxic-appearing or diaphoretic.  HENT:     Head: Normocephalic.  Eyes:     General: No scleral icterus.       Right eye: No discharge.        Left eye: No discharge.  Cardiovascular:     Rate and Rhythm: Normal rate.  Pulmonary:     Effort: Pulmonary effort is normal.  Abdominal:     General: Abdomen is flat. Bowel sounds are normal. There is no distension. There are no signs of injury.     Palpations: Abdomen is soft. There is no mass or pulsatile mass.     Tenderness: There is abdominal tenderness in the right upper quadrant and epigastric area. There is no right CVA tenderness, left CVA tenderness, guarding or rebound.     Hernia: There is no hernia in the umbilical area or ventral area.  Skin:    General: Skin is warm and dry.  Neurological:     General: No focal deficit present.     Mental Status: She is alert.  Psychiatric:        Behavior: Behavior is cooperative.     ED Results / Procedures / Treatments   Labs (all labs ordered are listed, but only abnormal results are displayed) Labs Reviewed  CBC WITH DIFFERENTIAL/PLATELET - Abnormal; Notable for the following components:      Result Value   Platelets 471 (*)    Monocytes Absolute 0.0 (*)    All other components within normal limits  COMPREHENSIVE METABOLIC PANEL - Abnormal; Notable for the following components:   Glucose, Bld 120 (*)    Total Protein 8.3 (*)    ALT 47 (*)    All other components within normal limits  LIPASE, BLOOD - Abnormal; Notable for the following components:   Lipase 78 (*)    All other components within normal limits  URINALYSIS, ROUTINE W REFLEX MICROSCOPIC - Abnormal; Notable for the following components:   Leukocytes,Ua TRACE (*)    Bacteria, UA RARE (*)    All other components within normal limits    EKG None  Radiology CT ABDOMEN PELVIS W CONTRAST  Result Date: 11/27/2021 CLINICAL DATA:  Epigastric pain with previous history of pancreatitis. Recent  admission for septic shock. Pyuria noted in the ER. EXAM: CT ABDOMEN AND PELVIS WITH CONTRAST TECHNIQUE: Multidetector CT imaging of the abdomen and pelvis was performed using the standard protocol following bolus administration of intravenous contrast. RADIATION DOSE REDUCTION: This exam was performed according to the departmental dose-optimization program which includes automated exposure control, adjustment of the mA and/or kV according to patient size and/or use of iterative reconstruction technique. CONTRAST:  164m OMNIPAQUE IOHEXOL 300 MG/ML  SOLN COMPARISON:  The CT without contrast 11/19/2021, CT with contrast 11/12/2021 FINDINGS: Lower chest: No acute abnormality. Hepatobiliary: 17.4 cm in length mildly steatotic liver. No mass enhancement. Surgical absence of the gallbladder again noted without biliary dilatation. Pancreas: No focal abnormality or adjacent inflammation.  Spleen: No mass enhancement or splenomegaly. Adrenals/Urinary Tract: There is no adrenal mass, no focal abnormality in the renal cortex. There is no urinary stone or obstruction. There is mild thickening of the bladder versus changes due to nondistention. Stomach/Bowel: There is mild fluid distention of the stomach. The duodenum is unremarkable but the jejunum is mildly dilated compared to prior studies measuring up to 2.9 cm, which could be due to a regional ileus/enteritis or low-grade partial small bowel obstruction of indeterminate etiology and transition. Rest of the small bowel is normal caliber. There is fluid in the ascending colon. The appendix is normal. Moderate retained stool transverse and proximal descending colon is seen, sigmoid diverticulosis without evidence of diverticulitis. Vascular/Lymphatic: There is mild aortoiliac atherosclerosis. No AAA. There is no adenopathy. Reproductive: Status post hysterectomy. No adnexal masses. Other: Small umbilical and inguinal fat hernias. There are no acute inflammatory changes no free  air, hemorrhage or free fluid Musculoskeletal: Multilevel lumbar degenerative disc changes. No acute osseous findings. IMPRESSION: 1. Cystitis versus bladder nondistention. 2. Mild dilatation of the jejunum, etiology and transition indeterminate, could be due to a regional ileus or low-grade partial SBO. 3. Fluid in the ascending colon and moderate retained stool in the transverse and descending segments. 4. Sigmoid diverticulosis without diverticulitis. 5. No pancreatic mass, ductal dilatation or adjacent inflammatory changes. 6. Mild hepatic steatosis and remaining findings described above. Electronically Signed   By: Telford Nab M.D.   On: 11/27/2021 03:35    Procedures Procedures    Medications Ordered in ED Medications  sodium chloride 0.9 % bolus 1,000 mL (has no administration in time range)  ondansetron (ZOFRAN) injection 4 mg (has no administration in time range)  morphine (PF) 4 MG/ML injection 4 mg (has no administration in time range)    ED Course/ Medical Decision Making/ A&P                           Medical Decision Making Amount and/or Complexity of Data Reviewed Radiology: ordered.  Risk Prescription drug management.   Alert 56 year old female in no acute distress, nontoxic-appearing.  Presents to the ED with a chief complaint of elevated lipase, nausea, vomiting, and epigastric abdominal pain.  Information was obtained from patient and patient's husband at bedside.  I reviewed patient's past medical records including previous prior notes, labs, and imaging.  Patient has medical history as outlined in HPI which complicates her care  Per chart review patient was seen by her PCP earlier today for follow-up of recent admission for urosepsis.  CMP, lipase, and hepatitis panel were obtained.  Lipase obtained earlier today was elevated at 1254.  Patient had repeat lab work obtained after triage.  I personally viewed interpret patient's lab results.  Pertinent findings  include: -CBC unremarkable, -ALT slightly elevated at 47 -Lipase 78 -UA shows no signs of infection  Will obtain CT abdomen pelvis to evaluate for possible acute pancreatitis due to patient's symptoms and elevated lipase.  We will give patient morphine for pain management, Zofran for nausea, and fluid bolus  I personally viewed and interpreted patient CT imaging.  Agree with radiology interpretation of no pancreatic mass, ductal dilation or adjacent inflammatory changes.  Mild dilation of the jejunum which could be due to regional ileus or low-grade partial SBO.  Patient reports that she had a bowel movement yesterday and has been passing flatus today without issue.  Low suspicion for small bowel obstruction at this time.  Patient  is able to tolerate p.o. intake without difficulty.  Patient reports that pain is under control after receiving morphine x1.  Shared decision making with patient about admission for bowel rest and pain management first discharged home close follow-up with PCP.  Patient elects for discharge at this time.  Based on patient's chief complaint, I considered admission might be necessary, however after reassuring ED workup feel patient is reasonable for discharge.  Discussed results, findings, treatment and follow up. Patient advised of return precautions. Patient verbalized understanding and agreed with plan.  Portions of this note were generated with [employer]. Dictation errors may occur despite best attempts at proofreading.         Final Clinical Impression(s) / ED Diagnoses Final diagnoses:  Epigastric abdominal pain  Nausea and vomiting, unspecified vomiting type    Rx / DC Orders ED Discharge Orders     None         Loni Beckwith, PA-C 11/27/21 0446    Quintella Reichert, MD 11/27/21 [phone]

## 2021-11-27 NOTE — Telephone Encounter (Signed)
Please advise 

## 2021-11-27 NOTE — Telephone Encounter (Signed)
Please have patient schedule a follow up appt here on Friday. Sooner if symptoms worsen.  Continue to work on hydration and use zofran if nauseated.

## 2021-11-28 ENCOUNTER — Other Ambulatory Visit (HOSPITAL_COMMUNITY): Payer: Self-pay

## 2021-11-29 ENCOUNTER — Encounter: Payer: Self-pay | Admitting: Family Medicine

## 2021-11-29 ENCOUNTER — Ambulatory Visit: Payer: Federal, State, Local not specified - PPO | Admitting: Family Medicine

## 2021-11-29 VITALS — BP 95/63 | HR 77 | Temp 97.5°F | Ht 64.0 in | Wt 156.0 lb

## 2021-11-29 DIAGNOSIS — R748 Abnormal levels of other serum enzymes: Secondary | ICD-10-CM

## 2021-11-29 DIAGNOSIS — R112 Nausea with vomiting, unspecified: Secondary | ICD-10-CM | POA: Diagnosis not present

## 2021-11-29 DIAGNOSIS — R7401 Elevation of levels of liver transaminase levels: Secondary | ICD-10-CM | POA: Diagnosis not present

## 2021-11-29 DIAGNOSIS — N12 Tubulo-interstitial nephritis, not specified as acute or chronic: Secondary | ICD-10-CM | POA: Diagnosis not present

## 2021-11-29 MED ORDER — FAMOTIDINE 20 MG PO TABS: 20.0000 mg | ORAL_TABLET | Freq: Two times a day (BID) | ORAL | 0 refills | Status: DC

## 2021-11-29 MED ORDER — SUCRALFATE 1 G PO TABS: 1.0000 g | ORAL_TABLET | Freq: Three times a day (TID) | ORAL | 1 refills | Status: DC

## 2021-11-29 MED ORDER — ONDANSETRON 4 MG PO TBDP: 4.0000 mg | ORAL_TABLET | Freq: Three times a day (TID) | ORAL | 2 refills | Status: DC | PRN

## 2021-11-29 NOTE — Progress Notes (Signed)
Lindsay Hamilton , November 18, 1965, 56 y.o., female MRN: [id number] Patient Care Team    Relationship Specialty Notifications Start End  [name], DO PCP - General Family Medicine  04/15/19   Sharia Reeve, MD Referring Physician Psychiatry  09/14/13   Garnet Sierras, DO Consulting Physician Allergy  03/17/19   Clarene Essex, MD Consulting Physician Gastroenterology  04/15/19   Vickey Huger, MD Consulting Physician Orthopedic Surgery  04/15/19   Donia Ast, [place]  Orthopedic Surgery  04/15/19   Roel Cluck, MD Referring Physician Ophthalmology  04/15/19   Lelon Perla, MD Consulting Physician Cardiology  04/15/19   Kathrynn Ducking, MD (Inactive) Consulting Physician Neurology  04/15/19   Arvella Nigh, MD Consulting Physician Obstetrics and Gynecology  04/15/19   Juluis Rainier  Optometry  04/15/19   Clabe Seal, DMD  Orthodontics  04/15/19     Chief Complaint  Patient presents with   Pancreatitis    ED f/u;      Subjective:  Lindsay Hamilton  is a 56 y.o. female presents for follow up after patient was referred to the ED after a lipase greater than 1200 was noted on labs in office setting after nausea and vomiting occurred.  Patient reports she is still having difficulty eating.  She is tolerating liquids and foods as long as she is taking the Zofran.  She has lost an additional 2 pounds.  Lipase in the ED was 78.  Liver enzymes continue to improve slowly.  CBC was unremarkable.  With only mildly elevated platelets. CT abdomen resulted with mild fluid distention of the stomach.  The jejunum is mildly dilated compared to the prior study just a few days before.  Is now measuring up to 2.9 cm's which could be due to regional ileus/enteritis or low-grade partial small bowel obstruction of undetermined analogy and transition.  The rest of the small bowel was normal.  There was fluid in the ascending colon and the appendix appeared normal.  She did have  moderate retained stool along the transverse colon and descending colon.  Also possible cystitis versus bladder nondistention.  Patient reports she still feels like she is having chills but no fevers.  She feels like she is constipated.   Prior note: on pyelonephritis illness with sepsis. She was admitted 11/12/2021 for primary diagnosis septic shock. Lindsay Hamilton was discharged on 11/14/2021 to home.  Patients hospital follow-up appointment, she continued to feel fatigued and had a decreased appetite.  Had reported a low-grade temperature after hospital discharge.  She had elevated liver enzymes during her hospital stay, likely secondary to the septic shock.  Repeat liver enzymes during hospital follow-up remained elevated, WBC normal, urinalysis normal.   She was seen in the ED thereafter with concerns of liver functions remaining elevated and low-grade temperature.  Repeat labs were collected which showed stable liver enzymes that were starting to trend down, repeat abdominal CT was reassuring for resolved infection and white count was normal.  Chest x-ray was unremarkable.  Abdominal ultrasound was unremarkable.  Close follow-up arranged with PCP Today patient reports she is seeing some improvement. She is tolerating PO. She did start to vomit at 1:30am this morning. This was new for her.  Last BM 2 days ago. Normal  Recent Labs  Lab 11/26/21 1913  HGB 13.4  HCT 42.4  WBC 5.4  PLT 471*      Latest Ref Rng & Units 11/26/2021  7:13 PM 11/26/2021    8:54 AM 11/19/2021    5:15 PM  CMP  Glucose 70 - 99 mg/dL 120  94  98   BUN 6 - 20 mg/dL $Remove'13  16  13   'IRDNyMl$ Creatinine 0.44 - 1.00 mg/dL 0.60  0.65  0.66   Sodium 135 - 145 mmol/L 141  143  140   Potassium 3.5 - 5.1 mmol/L 3.9  4.3  3.9   Chloride 98 - 111 mmol/L 109  104  103   CO2 22 - 32 mmol/L 23  32  28   Calcium 8.9 - 10.3 mg/dL 10.1  9.7  9.8   Total Protein 6.5 - 8.1 g/dL 8.3  6.9  6.9   Total Bilirubin 0.3 - 1.2 mg/dL 0.5  0.4  0.3   Alkaline  Phos 38 - 126 U/L 64  67  76   AST 15 - 41 U/L 22  18  53   ALT 0 - 44 U/L 47  41  103       CT ABDOMEN PELVIS W CONTRAST  Result Date: 11/27/2021 CLINICAL DATA:  Epigastric pain with previous history of pancreatitis. Recent admission for septic shock. Pyuria noted in the ER. EXAM: CT ABDOMEN AND PELVIS WITH CONTRAST TECHNIQUE: Multidetector CT imaging of the abdomen and pelvis was performed using the standard protocol following bolus administration of intravenous contrast. RADIATION DOSE REDUCTION: This exam was performed according to the departmental dose-optimization program which includes automated exposure control, adjustment of the mA and/or kV according to patient size and/or use of iterative reconstruction technique. CONTRAST:  160mL OMNIPAQUE IOHEXOL 300 MG/ML  SOLN COMPARISON:  The CT without contrast 11/19/2021, CT with contrast 11/12/2021 FINDINGS: Lower chest: No acute abnormality. Hepatobiliary: 17.4 cm in length mildly steatotic liver. No mass enhancement. Surgical absence of the gallbladder again noted without biliary dilatation. Pancreas: No focal abnormality or adjacent inflammation. Spleen: No mass enhancement or splenomegaly. Adrenals/Urinary Tract: There is no adrenal mass, no focal abnormality in the renal cortex. There is no urinary stone or obstruction. There is mild thickening of the bladder versus changes due to nondistention. Stomach/Bowel: There is mild fluid distention of the stomach. The duodenum is unremarkable but the jejunum is mildly dilated compared to prior studies measuring up to 2.9 cm, which could be due to a regional ileus/enteritis or low-grade partial small bowel obstruction of indeterminate etiology and transition. Rest of the small bowel is normal caliber. There is fluid in the ascending colon. The appendix is normal. Moderate retained stool transverse and proximal descending colon is seen, sigmoid diverticulosis without evidence of diverticulitis.  Vascular/Lymphatic: There is mild aortoiliac atherosclerosis. No AAA. There is no adenopathy. Reproductive: Status post hysterectomy. No adnexal masses. Other: Small umbilical and inguinal fat hernias. There are no acute inflammatory changes no free air, hemorrhage or free fluid Musculoskeletal: Multilevel lumbar degenerative disc changes. No acute osseous findings. IMPRESSION: 1. Cystitis versus bladder nondistention. 2. Mild dilatation of the jejunum, etiology and transition indeterminate, could be due to a regional ileus or low-grade partial SBO. 3. Fluid in the ascending colon and moderate retained stool in the transverse and descending segments. 4. Sigmoid diverticulosis without diverticulitis. 5. No pancreatic mass, ductal dilatation or adjacent inflammatory changes. 6. Mild hepatic steatosis and remaining findings described above. Electronically Signed   By: Telford Nab M.D.   On: 11/27/2021 03:35   US Abdomen Complete  Result Date: 11/19/2021 CLINICAL DATA:  Abdomen pain EXAM: ABDOMEN ULTRASOUND COMPLETE COMPARISON:  CT 11/19/2021  FINDINGS: Gallbladder: Surgically absent Common bile duct: Diameter: 4.4 mm Liver: No focal lesion identified. Within normal limits in parenchymal echogenicity. Portal vein is patent on color Doppler imaging with normal direction of blood flow towards the liver. IVC: No abnormality visualized. Pancreas: Visualized portion unremarkable. Spleen: Size and appearance within normal limits. Right Kidney: Length: 10.5 cm. Echogenicity within normal limits. No mass or hydronephrosis visualized. Left Kidney: Length: 11.3 cm. Echogenicity within normal limits. No mass or hydronephrosis visualized. Abdominal aorta: No aneurysm visualized. Other findings: None. IMPRESSION: Status post cholecystectomy.  Otherwise negative examination Electronically Signed   By: Donavan Foil M.D.   On: 11/19/2021 20:45   DG Chest 2 View  Result Date: 11/19/2021 CLINICAL DATA:  Continued malaise after  recent urosepsis from pyelonephritis. EXAM: CHEST - 2 VIEW COMPARISON:  Chest x-ray dated July 15, 2019. FINDINGS: The heart size and mediastinal contours are within normal limits. Both lungs are clear. The visualized skeletal structures are unremarkable. IMPRESSION: No active cardiopulmonary disease. Electronically Signed   By: Titus Dubin M.D.   On: 11/19/2021 20:36   CT Renal Stone Study  Result Date: 11/19/2021 CLINICAL DATA:  Flank pain, kidney stone suspected. Right lower quadrant pain. Recently admitted for urosepsis related to pyelonephritis. EXAM: CT ABDOMEN AND PELVIS WITHOUT CONTRAST TECHNIQUE: Multidetector CT imaging of the abdomen and pelvis was performed following the standard protocol without IV contrast. RADIATION DOSE REDUCTION: This exam was performed according to the departmental dose-optimization program which includes automated exposure control, adjustment of the mA and/or kV according to patient size and/or use of iterative reconstruction technique. COMPARISON:  CT examination dated November 12, 2021 FINDINGS: Lower chest: No acute abnormality. Hepatobiliary: No focal liver abnormality is seen. Status post cholecystectomy. No biliary dilatation. Pancreas: Unremarkable. No pancreatic ductal dilatation or surrounding inflammatory changes. Spleen: Normal in size without focal abnormality. Adrenals/Urinary Tract: Adrenal glands are unremarkable. Kidneys are normal, without renal calculi, focal lesion, or hydronephrosis. Mild nonspecific bilateral perinephric fat stranding. No appreciable perinephric fluid collection. Bladder is unremarkable. Stomach/Bowel: Stomach is within normal limits. Appendix appears normal. No evidence of bowel wall thickening, distention, or inflammatory changes. Vascular/Lymphatic: Mild aortic atherosclerosis. No enlarged abdominal or pelvic lymph nodes. Reproductive: Status post hysterectomy. No adnexal masses. Other: No abdominal wall hernia or abnormality. No  abdominopelvic ascites. 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 No acute osseous abnormality. IMPRESSION: 1. No evidence of nephrolithiasis or hydronephrosis. No appreciable urinary bladder wall thickening. Evaluation of pyelonephritis is limited on this noncontrast enhanced examination. Urinalysis could be considered for further evaluation if clinically warranted. 2.  Normal appendix.  No evidence of colitis or diverticulitis. 3.  Status post cholecystectomy and hysterectomy. 4. 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 No acute osseous abnormality. Electronically Signed   By: Keane Police D.O.   On: 11/19/2021 17:31   CT ABDOMEN PELVIS W CONTRAST  Result Date: 11/12/2021 CLINICAL DATA:  Abdominal pain EXAM: CT ABDOMEN AND PELVIS WITH CONTRAST TECHNIQUE: Multidetector CT imaging of the abdomen and pelvis was performed using the standard protocol following bolus administration of intravenous contrast. RADIATION DOSE REDUCTION: This exam was performed according to the departmental dose-optimization program which includes automated exposure control, adjustment of the mA and/or kV according to patient size and/or use of iterative reconstruction technique. CONTRAST:  134mL OMNIPAQUE IOHEXOL 300 MG/ML  SOLN COMPARISON:  CT abdomen and pelvis dated July 29, 2020 FINDINGS: Lower chest: Small hiatal hernia.  No acute abnormality. Hepatobiliary: No focal liver  abnormality is seen. Status post cholecystectomy. No biliary dilatation. Pancreas: Unremarkable. No pancreatic ductal dilatation or surrounding inflammatory changes. Spleen: Normal in size without focal abnormality. Adrenals/Urinary Tract: Bilateral adrenal glands are unremarkable. No hydronephrosis, nephrolithiasis or suspicious renal lesions. Heterogeneous enhancement of the bilateral kidneys is seen on delayed imaging. Mild bilateral urothelial enhancement. Bladder wall thickening with perivesicular fat  stranding. Stomach/Bowel: Stomach is within normal limits. Appendix appears normal. No evidence of bowel wall thickening, distention, or inflammatory changes. Vascular/Lymphatic: Aortic atherosclerosis. No enlarged abdominal or pelvic lymph nodes. Reproductive: Status post hysterectomy. No adnexal masses. Other: No abdominal wall hernia or abnormality. No abdominopelvic ascites. Musculoskeletal: No acute or significant osseous findings. IMPRESSION: 1. Heterogeneous enhancement of the bilateral kidneys on delayed imaging, mild bilateral urothelial enhancement and bladder wall thickening, findings can be seen in the setting of infection. Recommend correlation with urinalysis. 2.  Aortic Atherosclerosis (ICD10-I70.0). Electronically Signed   By: Yetta Glassman M.D.   On: 11/12/2021 07:57        08/19/2021    2:37 PM 03/04/2021    2:08 PM 01/28/2021    9:19 AM 08/08/2020   10:37 AM 01/27/2020    1:11 PM  Depression screen PHQ 2/9  Decreased Interest 2 0 0  0  Down, Depressed, Hopeless 2 0 1 1 0  PHQ - 2 Score 4 0 1 1 0  Altered sleeping 2  1 2  0  Tired, decreased energy 1  1 1  0  Change in appetite 0  0 1 0  Feeling bad or failure about yourself  0  0 0 0  Trouble concentrating 3  0 0 0  Moving slowly or fidgety/restless 1  1 1  0  Suicidal thoughts 0  0 0 0  PHQ-9 Score 11  4 6  0    No Known Allergies Social History   Tobacco Use   Smoking status: Former    Types: Cigarettes    Quit date: 1997    Years since quitting: 26.5   Smokeless tobacco: Never  Substance Use Topics   Alcohol use: Yes    Comment: social   Past Medical History:  Diagnosis Date   Alcohol abuse, in remission    Depression    Head injury    Headaches due to old head injury    Ingrowing nail 02/20/2020   Insomnia 09/14/2013   Migraine without aura, without mention of intractable migraine without mention of status migrainosus 09/14/2013   Obese    Pancreatitis    Pseudoseizures    Right upper quadrant pain  06/26/2020   Tubular adenoma of colon 2018   Past Surgical History:  Procedure Laterality Date   ABDOMINAL HYSTERECTOMY  2009   Partial   CARPAL TUNNEL RELEASE  1998   CESAREAN SECTION     x2-1992, 1997   CHOLECYSTECTOMY  2002   COLONOSCOPY W/ BIOPSIES  05/2016   tubular adenoma   GREAT TOE ARTHRODESIS, METATARSALPHALANGEAL JOINT Right 07/17/2018   KNEE ARTHROSCOPY     bilateral, 3 right and 3 left   SHOULDER ARTHROSCOPY Bilateral    Right 2012, left 2014   Family History  Problem Relation Age of Onset   Hypertension Mother    CAD Mother        Died of MI at age 34   Asthma Mother    Pulmonary embolism Mother        x2   Cancer - Prostate Father    Atrial fibrillation Father    Hyperlipidemia Father  Seizures Sister    Migraines Sister    Hypertension Brother    Hypertension Brother    Lung cancer Paternal Grandmother    Breast cancer Neg Hx    Allergic rhinitis Neg Hx    Eczema Neg Hx    Urticaria Neg Hx    Allergies as of 11/29/2021   No Known Allergies      Medication List        Accurate as of November 29, 2021  6:49 PM. If you have any questions, ask your nurse or doctor.          buPROPion 150 MG 24 hr tablet Commonly known as: WELLBUTRIN XL Take 1 tablet (150 mg total) by mouth daily. What changed: when to take this   clonazePAM 0.5 MG tablet Commonly known as: KLONOPIN 1/2 tab during the day and 1-2 tabs QHS and What changed:  how much to take how to take this when to take this additional instructions   escitalopram 20 MG tablet Commonly known as: LEXAPRO Take 1 tablet (20 mg total) by mouth at bedtime.   famotidine 20 MG tablet Commonly known as: Pepcid Take 1 tablet (20 mg total) by mouth 2 (two) times daily. Started by: Howard Pouch, DO   gabapentin 100 MG capsule Commonly known as: NEURONTIN Take 100 mg by mouth every evening.   Linzess 72 MCG capsule Generic drug: linaclotide TAKE 1 CAPSULE BY MOUTH DAILY BEFORE BREAKFAST.    omeprazole 40 MG capsule Commonly known as: PRILOSEC Take 1 capsule (40 mg total) by mouth daily before breakfast.   ondansetron 4 MG disintegrating tablet Commonly known as: ZOFRAN-ODT Take 1 tablet (4 mg total) by mouth every 8 (eight) hours as needed for nausea or vomiting. $RemoveBefo'4mg'GTnFwADilIU$  ODT q4 hours prn nausea/vomit What changed:  how much to take how to take this when to take this reasons to take this Changed by: Howard Pouch, DO   sucralfate 1 g tablet Commonly known as: Carafate Take 1 tablet (1 g total) by mouth 4 (four) times daily -  with meals and at bedtime. Started by: Howard Pouch, DO        All past medical history, surgical history, allergies, family history, immunizations and medications were updated in the EMR today and reviewed under the history and medication portions of their EMR.     Review of Systems  Constitutional:  Positive for malaise/fatigue and weight loss. Negative for chills and fever.  Respiratory:  Negative for cough.   Gastrointestinal:  Positive for abdominal pain, nausea and vomiting. Negative for blood in stool, constipation, diarrhea, heartburn and melena.  Genitourinary:  Positive for flank pain. Negative for dysuria, frequency, hematuria and urgency.  Skin:  Negative for rash.  Neurological:  Negative for dizziness, weakness and headaches.    ROS: Negative, with the exception of above mentioned in HPI   Objective:  BP 95/63   Pulse 77   Temp (!) 97.5 F (36.4 C) (Oral)   Ht $R'5\' 4"'zq$  (1.626 m)   Wt 156 lb (70.8 kg)   SpO2 96%   BMI 26.78 kg/m  Body mass index is 26.78 kg/m. Physical Exam Vitals and nursing note reviewed.  Constitutional:      General: She is not in acute distress.    Appearance: She is not ill-appearing, toxic-appearing or diaphoretic.     Comments: Appears tired  HENT:     Head: Normocephalic and atraumatic.     Mouth/Throat:     Mouth: Mucous membranes are  moist.  Eyes:     General: No scleral icterus.        Right eye: No discharge.        Left eye: No discharge.     Extraocular Movements: Extraocular movements intact.     Conjunctiva/sclera: Conjunctivae normal.     Pupils: Pupils are equal, round, and reactive to light.  Cardiovascular:     Rate and Rhythm: Normal rate and regular rhythm.  Pulmonary:     Effort: Pulmonary effort is normal. No respiratory distress.     Breath sounds: Normal breath sounds. No wheezing, rhonchi or rales.  Abdominal:     General: Abdomen is flat. Bowel sounds are decreased. There is no distension.     Palpations: Abdomen is soft. There is no shifting dullness, fluid wave, hepatomegaly or mass.     Tenderness: There is abdominal tenderness in the right upper quadrant, epigastric area and left upper quadrant. There is no right CVA tenderness, left CVA tenderness, guarding or rebound.     Hernia: No hernia is present.     Comments: Belching, gassy  Musculoskeletal:     Cervical back: Neck supple. No tenderness.     Right lower leg: No edema.     Left lower leg: No edema.  Lymphadenopathy:     Cervical: No cervical adenopathy.  Skin:    General: Skin is warm and dry.     Coloration: Skin is not jaundiced or pale.     Findings: No erythema or rash.  Neurological:     Mental Status: She is alert and oriented to person, place, and time. Mental status is at baseline.     Motor: No weakness.     Gait: Gait normal.  Psychiatric:        Mood and Affect: Mood normal.        Behavior: Behavior normal.        Thought Content: Thought content normal.        Judgment: Judgment normal.     Assessment/Plan: Chaz Ronning is a 56 y.o. female present for OV for Hospital discharge follow up Abnormal abdominal CT/elevated lipase/nausea/vomit Patient has significant tenderness epigastric region. Encouraged her to continue omeprazole Start Carafate Start Pepcid Refilled Zofran She has stopped the Celebrex since being seen last. Continue to hydrate and take an  adequate nutrition daily.  Encouraged full liquid/soft diet and then advance diet as tolerated after 24 hours Urgent referral to her gastroenterology team to follow-up on possible SBO/enteritis.  She is tender over this area today.  We discussed emergent precautions for over the weekend. Lipase, CMP, urinalysis, sed rate, CRP collected today F/u dependent on lab results.   Reviewed expectations re: course of current medical issues. Discussed self-management of symptoms. Outlined signs and symptoms indicating need for more acute intervention. Patient verbalized understanding and all questions were answered. Patient received an After-Visit Summary. Any changes in medications were reviewed and patient was provided with updated med list with their AVS.     Orders Placed This Encounter  Procedures   Lipase   Comp Met (CMET)   Urinalysis w microscopic + reflex cultur   Sedimentation rate   C-reactive protein   Ambulatory referral to Gastroenterology     Note is dictated utilizing voice recognition software. Although note has been proof read prior to signing, occasional typographical errors still can be missed. If any questions arise, please do not hesitate to call for verification.   electronically signed by:  Howard Pouch, DO  [facility] [place]     

## 2021-11-29 NOTE — Patient Instructions (Addendum)
No follow-ups on file.        Great to see you today.    If labs were collected, we will inform you of lab results once received either by echart message or telephone call.   - echart message- for normal results that have been seen by the patient already.   - telephone call: abnormal results or if patient has not viewed results in their echart.     Start carafate before meals and before bed.  Start pepcid every 12 hours. Continue the omeprazole  OTC: Senakot before bed Miralax 1 cap in water.   Follow a liquid/soft diet for 24-48 hours, then advanced as tolerated.   Bowel Obstruction A bowel obstruction means that something is blocking the small or large bowel. The bowel is also called the intestine. It is the long tube that connects the stomach to the opening of the butt (anus). When something blocks the bowel, food and fluids cannot pass through like normal. This condition needs to be treated. Treatment depends on the cause of the problem and how bad the problem is. What are the causes? Common causes of this condition include: Scar tissue inside the body from past surgery or from high-energy X-rays (radiation). Recent surgery in the belly. This affects how food moves in the bowel. Some diseases, such as: Irritation of the lining of the digestive tract (Crohn's disease). Irritation of small pouches in the bowel (diverticulitis). Growths or tumors. A bulging organ or tissue (hernia). Twisting of the bowel (volvulus). A swallowed object. Slipping of a part of the bowel into another part (intussusception). What are the signs or symptoms? Symptoms of this condition include: Pain in the belly. Feeling like you may vomit (nauseous). Vomiting. Bloating in the belly. Being unable to pass gas. Trouble pooping (constipation). A lot of belching. Watery poop (diarrhea). How is this treated? Treatment for this condition may include: Fluids and pain medicines that are given  through an IV tube. Your doctor may tell you not to eat or drink if you feel like you may vomit or are vomiting. Eating a clear liquid diet for a few days. Putting a small tube (nasogastric tube) through the nose, down the throat, and into the stomach. This will help with pain, discomfort, and the feeling like you may vomit. Surgery. This may be needed if other treatments do not work. Follow these instructions at home: Medicines Take over-the-counter and prescription medicines only as told by your doctor. If you were prescribed an antibiotic medicine, take it as told by your doctor. Do not stop taking the antibiotic even if you start to feel better. General instructions Follow instructions from your doctor about what you can or cannot eat and drink. You may need to: Only drink clear liquids until you start to get better. Avoid solid foods. Return to your normal activities when your doctor says that it is safe. Rest as told by your doctor. Get up to take short walks every 1 to 2 hours. Ask for help if you feel weak or unsteady. Keep all follow-up visits. How is this prevented? After having a bowel obstruction, you may be more likely to have another. You can do some things to stop it from happening again. If you have a long-term (chronic) disease, contact your doctor if you see changes or problems. Avoid having trouble pooping. You may need to take these actions to prevent or treat trouble pooping: Drink enough fluid to keep your pee (urine) pale yellow. Take over-the-counter or  prescription medicines. Eat foods that are high in fiber. These include beans, whole grains, and fresh fruits and vegetables. Limit foods that are high in fat and sugar. These include fried or sweet foods. Stay active. Ask your doctor which exercises are safe for you. Avoid stress. Eat three small meals and three small snacks each day. Work with a diet and Building services engineer (dietitian) to make a meal plan that works  for you. Do not smoke or use any products that contain nicotine or tobacco. If you need help quitting, ask your doctor. Contact a doctor if: You have a fever. You have chills. Get help right away if: You have pain or cramps that get worse. You vomit blood. You feel like you may vomit and the feeling lasts a long time. You cannot stop vomiting. You cannot drink fluids. You feel confused. You feel very thirsty (dehydrated). Your belly gets more bloated. You feel weak or you faint. Summary A bowel obstruction means that something is blocking the small or large bowel. Treatment may include IV fluids and pain medicine. You may also have a clear liquid diet, a small tube in your stomach, or surgery. Drink clear liquids and avoid solid foods until you get better, as told by your doctor. This information is not intended to replace advice given to you by your health care provider. Make sure you discuss any questions you have with your health care provider. Document Revised: 06/03/2020 Document Reviewed: 06/03/2020 Elsevier Patient Education  [place].

## 2021-11-30 LAB — URINALYSIS W MICROSCOPIC + REFLEX CULTURE
Bacteria, UA: NONE SEEN /HPF
Bilirubin Urine: NEGATIVE
Glucose, UA: NEGATIVE
Hgb urine dipstick: NEGATIVE
Hyaline Cast: NONE SEEN /LPF
Ketones, ur: NEGATIVE
Leukocyte Esterase: NEGATIVE
Nitrites, Initial: NEGATIVE
Protein, ur: NEGATIVE
RBC / HPF: NONE SEEN /HPF (ref 0–2)
Specific Gravity, Urine: 1.007 (ref 1.001–1.035)
Squamous Epithelial / HPF: NONE SEEN /HPF (ref <=5)
pH: 6 (ref 5.0–8.0)

## 2021-11-30 LAB — NO CULTURE INDICATED

## 2021-11-30 LAB — C-REACTIVE PROTEIN: CRP: 2.5 mg/L (ref <8.0)

## 2021-11-30 LAB — LIPASE: Lipase: 81 U/L — ABNORMAL HIGH (ref 7–60)

## 2021-11-30 LAB — COMPREHENSIVE METABOLIC PANEL
AG Ratio: 1.5 (calc) (ref 1.0–2.5)
ALT: 36 U/L — ABNORMAL HIGH (ref 6–29)
AST: 24 U/L (ref 10–35)
Albumin: 4.2 g/dL (ref 3.6–5.1)
Alkaline phosphatase (APISO): 69 U/L (ref 37–153)
BUN: 17 mg/dL (ref 7–25)
CO2: 27 mmol/L (ref 20–32)
Calcium: 9.9 mg/dL (ref 8.6–10.4)
Chloride: 102 mmol/L (ref 98–110)
Creat: 0.73 mg/dL (ref 0.50–1.03)
Globulin: 2.8 g/dL (calc) (ref 1.9–3.7)
Glucose, Bld: 80 mg/dL (ref 65–99)
Potassium: 4.7 mmol/L (ref 3.5–5.3)
Sodium: 138 mmol/L (ref 135–146)
Total Bilirubin: 0.5 mg/dL (ref 0.2–1.2)
Total Protein: 7 g/dL (ref 6.1–8.1)

## 2021-11-30 LAB — SEDIMENTATION RATE: Sed Rate: 25 mm/h (ref 0–30)

## 2021-12-02 ENCOUNTER — Telehealth: Payer: Self-pay | Admitting: Family Medicine

## 2021-12-02 ENCOUNTER — Telehealth: Payer: Self-pay | Admitting: Gastroenterology

## 2021-12-02 NOTE — Telephone Encounter (Signed)
The pt called to report that she has a temperature of 101.8 F and abnormal CT per PCP.  She has an appt on 8/1 with Vicie Mutters PA.  She has been advised that she should be evaluated at the ED or Urgent care.  She has agreed and will call back if she no longer needs the appt with Estill Bamberg.

## 2021-12-02 NOTE — Telephone Encounter (Signed)
Please inform patient Lipase is still mildly elevated at 81. Liver enzymes continue to slowly improve with the AST still remaining normal and the ALT almost normal at 36. Electrolytes and kidney function are normal. Inflammatory markers are normal. Urinalysis is normal. Overall, labs are reassuring.  No explanation for the nausea, vomiting and mildly elevated lipase.   Next step is following with her gastroenterology team.

## 2021-12-02 NOTE — Telephone Encounter (Signed)
Inbound call from patient stating she has a fewer of 101.08 and bp level is 147/95. Patient is scheduled for an OV 8/1/ AT 9:15 am with a PA. Please give patient a call back to further advise.  Thank you

## 2021-12-02 NOTE — Telephone Encounter (Signed)
LVM for pt to CB regarding results.  

## 2021-12-02 NOTE — Telephone Encounter (Signed)
Spoke with patient regarding results/recommendations.  

## 2021-12-02 NOTE — Progress Notes (Unsigned)
12/03/2021 Lindsay Hamilton [id number] 29-Mar-1966  Referring provider: [name], DO Primary GI doctor: Dr. Ardis Hughs  ASSESSMENT AND PLAN:   Assessment: 56 y.o. female here for assessment of the following: 1. Nausea and vomiting, unspecified vomiting type   2. Gastroesophageal reflux disease, unspecified whether esophagitis present   3. Chronic idiopathic constipation   4. Abdominal pain, epigastric    56 year old female with longstanding history of chronic constipation, chronic GERD status post hiatal hernia care 2021, had colonoscopy 2021 unremarkable, EGD 11/2020 benign stenosis status post dilatation, multiple prior CT's unremarkable. Had recent admission for pyelonephritis for 2 days 07/11, since that time has had nausea, vomiting, intermittent fevers. Patient's had mildly elevated lipase likely due to vomiting, CT 11/27/2021 showed no pancreatitis.  Did show constipation and possible ileus versus low-grade partial small bowel Since that time patient's increased Senokot, has had some bowel movements.  We will evaluate for infection with recent pyelonephritis, get lactic acid, repeat ESR CRP. We will repeat KUB to evaluate for obstruction.  I believe the CT could be more from her chronic constipation. If there is no signs of obstruction we will maximize bowel regiment with increasing Linzess from 72 to 290 with possible Motegrity. May be a possibility of postinfectious gastroparesis with symptoms, can do trial of Reglan to help increase motility as well. Pending results may also benefit from repeat endoscopy. ER precautions discussed with husband and patient, husband is very concerned there is some underlying infection.  She has no murmur, no signs of endocarditis, no fever here in the office, no chest pain no shortness of breath.  Plan:  Orders Placed This Encounter  Procedures   DG Abd 1 View   CBC with Differential/Platelet   Comprehensive metabolic panel   H. pylori  antibody, IgG   TSH   Sedimentation rate   High sensitivity CRP   Amylase   Lipase   Lactic acid, plasma    Meds ordered this encounter  Medications   pantoprazole (PROTONIX) 40 MG tablet    Sig: Take 1 tablet (40 mg total) by mouth 2 (two) times daily before a meal.    Dispense:  60 tablet    Refill:  3    History of Present Illness:  56 y.o. 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 And others listed below, returns to clinic today for evaluation of not tolerating p.o., abnormal CT scan.  Started with pyelonephritis and sepsis 11/12/2021, admitted with IV ABX and had cipro.  11/19/2021 she had worsening symptoms with fatigue and flank pain went to ER, got fluids. Had unremarkable AB [place]. Lactic acid negative.  11/26/2021 back to the ER for nausea, vomiting and epigastric pain.   Outpatient lipase at her primary care was 1254, repeat in the ER was 78. Repeat 11/29/2021 was 81 for lipase.  CT 11/27/2021 showed mild dilatation of the jejunum, possible ileus versus low-grade partial small bowel, showed constipation which patient is known to battle.  Patient's had previous CT scan unremarkable 11/12/2021 for abdominal pain 09/2020, 2020 and 2019.   Intentionally has lost about 50 lbs in the last year, but has lost about 5 lbs in last 5 days.  She has had nausea, vomiting.  She has had fever, highest 101 yestreday. She has epigastric pain  with right upper quadrant.  She denied hematemesis or coffee-ground emesis, had more bilious stomach contents. Was having constipation on linzess 72, did not have BM for a week, so added senokot 4 at night and miralax twice day, has loose stools Saturday and Sunday, no stool since then, and  states not passing gas.  No blood in stool, no dark black stool.  He is on prilosec once a day 40 mg, pepecid once a day, zofran, she is not on carafate.  She had dark urine yesterday, no burning.  No SOB, CP, no joint pain, rashes.   Denied NSAID use, stopped celebrex last Tuesday, denied any recent alcohol rare social drinking. Patient had Normal colonoscopy 2021 and upper endoscopy with benign stenosis status post dilatation 11/2020 Patient's had previous cholecystectomy, abdominal hysterectomy, C-section, hiatal hernia repair and umblical hernia. Patient had unremarkable CRP, sed rate 08 CBC 11/19/2021 showed hemoglobin 11.5 last checked 11/26/2021 showed 13.4. Lipase 11/19/2021 was 46, then on 11/26/2021 lipase was 1250 repeat was 78. 11/19/2021 AST was 54, ALT 103, repeat AST 18, ALT 41, alk phos 67, total bilirubin 0.4 Negative acute hepatitis panel  PREVIOUS GI 11/27/2021 CT abdomen pelvis with contrast for epigastric pain and history of pancreatitis showed cystic versus bladder nondistention, mild dilation of the jejunum etiology and transition indeterminate could be due to a regional ileus or low-grade partial small bowel obstruction.  Fluid-filled in the ascending colon and moderate retained stool in the transverse and descending segments, diverticulosis, normal pancreas without mass no ductal dilatation no inflammation.  She has mild hepatic steatosis  2019, 2020 and 09/2020, 11/12/2021 All for abdominal pain unrevealing  11/23/2020  upper endoscopy showed 1 benign-appearing intrinsic mild stenosis dilated to 18 mm otherwise unremarkable.  09/2019 colonoscopy with Dr. Watt Climes for personal history of polyps and right lower quadrant pain unremarkable   10/2019 gastric emptying scan unremarkable   10/2019 esophogram Small hiatal hernia.  Moderate gastroesophageal reflux. Benign stricture of the GE junction which did not allow passage of a 13 mm barium tablet  Current Medications:         Current Outpatient Medications (Other):    buPROPion (WELLBUTRIN XL) 150 MG 24 hr tablet, Take 1 tablet (150 mg total) by mouth daily. (Patient taking differently: Take 150 mg by mouth in the morning.)   clonazePAM (KLONOPIN) 0.5 MG tablet, 1/2 tab during the day and 1-2 tabs QHS and (Patient taking differently: Take 0.25-1 mg by mouth See admin instructions. Take 0.25 mg by mouth in the morning and 1 mg in the evening)   escitalopram (LEXAPRO) 20 MG tablet, Take 1 tablet (20 mg total) by mouth at bedtime.   gabapentin (NEURONTIN) 100 MG capsule, Take 100 mg by mouth every evening.   LINZESS 72 MCG capsule, TAKE 1 CAPSULE BY MOUTH DAILY BEFORE BREAKFAST.   ondansetron (ZOFRAN-ODT) 4 MG disintegrating tablet, Take 1 tablet (4 mg total) by mouth every 8 (eight) hours as needed for nausea or vomiting. 65m ODT q4 hours prn nausea/vomit   pantoprazole (PROTONIX) 40 MG tablet, Take 1 tablet (40 mg total) by mouth 2 (two) times daily before a meal.   sucralfate (CARAFATE) 1 g tablet, Take 1 tablet (1 g total) by mouth 4 (four) times daily -  with meals and at bedtime.   famotidine (PEPCID) 20 MG tablet, Take 1 tablet (20 mg total) by mouth 2 (two) times daily.  Surgical History:  She  has a past surgical history that includes Cholecystectomy (2002); Cesarean section; Knee  arthroscopy; Abdominal hysterectomy (2009); Colonoscopy w/ biopsies (05/2016); Carpal tunnel release (1998); Shoulder arthroscopy (Bilateral); and Great toe arthrodesis, metatarsalphalangeal joint (Right, 07/17/2018). Family History:  Her family history includes Asthma in her mother; Atrial fibrillation in her father; CAD in her mother; Cancer - Prostate in her father; Hyperlipidemia in her father; Hypertension in her brother, brother, and mother; Lung cancer in her paternal grandmother; Migraines in her sister; Pulmonary embolism in her mother; Seizures in her sister. Social History:   reports that she quit smoking about 26  years ago. Her smoking use included cigarettes. She has never used smokeless tobacco. She reports current alcohol use. She reports that she does not use drugs.  Current Medications, Allergies, Past Medical History, Past Surgical History, Family History and Social History were reviewed in [employer] record.  Physical Exam: BP 112/82   Pulse 76   Ht 5' 4"  (1.626 m)   Wt 150 lb (68 kg)   BMI 25.75 kg/m  General:   Appears uncomfortable female in no acute distress Heart : Regular rate and rhythm; no murmurs Pulm: Clear anteriorly; no wheezing Abdomen:  Soft, Non-distended AB, soft Hypoactive bowel sounds. mild tenderness in the epigastrium. With guarding and Without rebound, No organomegaly appreciated. Rectal: Not evaluated Extremities:  without  edema. Neurologic:  Alert and  oriented x4;  No focal deficits.  Psych:  Cooperative. Normal mood and affect.   Vladimir Crofts, PA-C 12/03/21

## 2021-12-03 ENCOUNTER — Ambulatory Visit (INDEPENDENT_AMBULATORY_CARE_PROVIDER_SITE_OTHER)
Admission: RE | Admit: 2021-12-03 | Discharge: 2021-12-03 | Disposition: A | Discharge status: Home / Self Care | Payer: Federal, State, Local not specified - PPO | Source: Ambulatory Visit | Attending: Physician Assistant | Admitting: Physician Assistant

## 2021-12-03 ENCOUNTER — Other Ambulatory Visit (INDEPENDENT_AMBULATORY_CARE_PROVIDER_SITE_OTHER): Payer: Federal, State, Local not specified - PPO

## 2021-12-03 ENCOUNTER — Encounter: Payer: Self-pay | Admitting: Physician Assistant

## 2021-12-03 ENCOUNTER — Ambulatory Visit: Payer: Federal, State, Local not specified - PPO | Admitting: Physician Assistant

## 2021-12-03 ENCOUNTER — Other Ambulatory Visit: Payer: Self-pay

## 2021-12-03 VITALS — BP 112/82 | HR 76 | Ht 64.0 in | Wt 150.0 lb

## 2021-12-03 DIAGNOSIS — K5641 Fecal impaction: Secondary | ICD-10-CM | POA: Diagnosis not present

## 2021-12-03 DIAGNOSIS — K219 Gastro-esophageal reflux disease without esophagitis: Secondary | ICD-10-CM | POA: Diagnosis not present

## 2021-12-03 DIAGNOSIS — R112 Nausea with vomiting, unspecified: Secondary | ICD-10-CM | POA: Diagnosis not present

## 2021-12-03 DIAGNOSIS — K5904 Chronic idiopathic constipation: Secondary | ICD-10-CM | POA: Diagnosis not present

## 2021-12-03 DIAGNOSIS — Z9049 Acquired absence of other specified parts of digestive tract: Secondary | ICD-10-CM | POA: Diagnosis not present

## 2021-12-03 DIAGNOSIS — R1013 Epigastric pain: Secondary | ICD-10-CM | POA: Diagnosis not present

## 2021-12-03 LAB — LIPASE: Lipase: 48 U/L (ref 11.0–59.0)

## 2021-12-03 LAB — COMPREHENSIVE METABOLIC PANEL
ALT: 36 U/L — ABNORMAL HIGH (ref 0–35)
AST: 20 U/L (ref 0–37)
Albumin: 4.4 g/dL (ref 3.5–5.2)
Alkaline Phosphatase: 62 U/L (ref 39–117)
BUN: 10 mg/dL (ref 6–23)
CO2: 27 mEq/L (ref 19–32)
Calcium: 9.9 mg/dL (ref 8.4–10.5)
Chloride: 103 mEq/L (ref 96–112)
Creatinine, Ser: 0.75 mg/dL (ref 0.40–1.20)
GFR: 89.53 mL/min (ref >60.00)
Glucose, Bld: 96 mg/dL (ref 70–99)
Potassium: 4.5 mEq/L (ref 3.5–5.1)
Sodium: 138 mEq/L (ref 135–145)
Total Bilirubin: 0.7 mg/dL (ref 0.2–1.2)
Total Protein: 7.9 g/dL (ref 6.0–8.3)

## 2021-12-03 LAB — CBC WITH DIFFERENTIAL/PLATELET
Basophils Absolute: 0 10*3/uL (ref 0.0–0.1)
Basophils Relative: 0.6 % (ref 0.0–3.0)
Eosinophils Absolute: 0.1 10*3/uL (ref 0.0–0.7)
Eosinophils Relative: 1.1 % (ref 0.0–5.0)
HCT: 40.5 % (ref 36.0–46.0)
Hemoglobin: 13 g/dL (ref 12.0–15.0)
Lymphocytes Relative: 32.3 % (ref 12.0–46.0)
Lymphs Abs: 1.5 10*3/uL (ref 0.7–4.0)
MCHC: 32.1 g/dL (ref 30.0–36.0)
MCV: 87 fl (ref 78.0–100.0)
Monocytes Absolute: 0.3 10*3/uL (ref 0.1–1.0)
Monocytes Relative: 6.8 % (ref 3.0–12.0)
Neutro Abs: 2.8 10*3/uL (ref 1.4–7.7)
Neutrophils Relative %: 59.2 % (ref 43.0–77.0)
Platelets: 280 10*3/uL (ref 150.0–400.0)
RBC: 4.65 Mil/uL (ref 3.87–5.11)
RDW: 14.9 % (ref 11.5–15.5)
WBC: 4.8 10*3/uL (ref 4.0–10.5)

## 2021-12-03 LAB — H. PYLORI ANTIBODY, IGG: H Pylori IgG: NEGATIVE

## 2021-12-03 LAB — AMYLASE: Amylase: 46 U/L (ref 27–131)

## 2021-12-03 LAB — HIGH SENSITIVITY CRP: CRP, High Sensitivity: 4.15 mg/L (ref 0.000–5.000)

## 2021-12-03 LAB — SEDIMENTATION RATE: Sed Rate: 57 mm/hr — ABNORMAL HIGH (ref 0–30)

## 2021-12-03 LAB — TSH: TSH: 0.79 u[IU]/mL (ref 0.35–5.50)

## 2021-12-03 MED ORDER — METOCLOPRAMIDE HCL 5 MG PO TABS: 5.0000 mg | ORAL_TABLET | Freq: Three times a day (TID) | ORAL | 0 refills | Status: DC

## 2021-12-03 MED ORDER — LINACLOTIDE 290 MCG PO CAPS: 290.0000 ug | ORAL_CAPSULE | Freq: Every day | ORAL | 0 refills | Status: DC

## 2021-12-03 MED ORDER — PANTOPRAZOLE SODIUM 40 MG PO TBEC: 40.0000 mg | DELAYED_RELEASE_TABLET | Freq: Two times a day (BID) | ORAL | 3 refills | Status: DC

## 2021-12-03 NOTE — Patient Instructions (Addendum)
Gastroparesis Please do small frequent meals like 4-6 meals a day.  Eat and drink liquids at separate times.  Avoid high fiber foods, cook your vegetables, avoid high fat food.  Suggest spreading protein throughout the day (greek yogurt, glucerna, soft meat, milk, eggs) Choose soft foods that you can mash with a fork When you are more symptomatic, change to pureed foods foods and liquids.  Consider reading "Living well with Gastroparesis" by Lambert Keto Gastroparesis is a condition in which food takes longer than normal to empty from the stomach. This condition is also known as delayed gastric emptying. It is usually a long-term (chronic) condition. There is no cure, but there are treatments and things that you can do at home to help relieve symptoms. Treating the underlying condition that causes gastroparesis can also help relieve symptoms What are the causes? In many cases, the cause of this condition is not known. Possible causes include: A hormone (endocrine) disorder, such as hypothyroidism or diabetes. A nervous system disease, such as Parkinson's disease or multiple sclerosis. Cancer, infection, or surgery that affects the stomach or vagus nerve. The vagus nerve runs from your chest, through your neck, and to the lower part of your brain. A connective tissue disorder, such as scleroderma. Certain medicines. What increases the risk?- can happen after an infection You are more likely to develop this condition if: You have certain disorders or diseases. These may include: An endocrine disorder. An eating disorder. Amyloidosis. Scleroderma. Parkinson's disease. Multiple sclerosis. Cancer or infection of the stomach or the vagus nerve. You have had surgery on your stomach or vagus nerve. You take certain medicines. You are female. What are the signs or symptoms? Symptoms of this condition include: Feeling full after eating very little or a loss of appetite. Nausea,  vomiting, or heartburn. Bloating of your abdomen. Inconsistent blood sugar (glucose) levels on blood tests. Unexplained weight loss. Acid from the stomach coming up into the esophagus (gastroesophageal reflux). Sudden tightening (spasm) of the stomach, which can be painful. Symptoms may come and go. Some people may not notice any symptoms. How is this diagnosed? This condition is diagnosed with tests, such as: Tests that check how long it takes food to move through the stomach and intestines. These tests include: Upper gastrointestinal (GI) series. For this test, you drink a liquid that shows up well on X-rays, and then X-rays are taken of your intestines. Gastric emptying scintigraphy. For this test, you eat food that contains a small amount of radioactive material, and then scans are taken. Wireless capsule GI monitoring system. For this test, you swallow a pill (capsule) that records information about how foods and fluid move through your stomach. Gastric manometry. For this test, a tube is passed down your throat and into your stomach to measure electrical and muscular activity. Endoscopy. For this test, a long, thin tube with a camera and light on the end is passed down your throat and into your stomach to check for problems in your stomach lining. Ultrasound. This test uses sound waves to create images of the inside of your body. This can help rule out gallbladder disease or pancreatitis as a cause of your symptoms. How is this treated? There is no cure for this condition, but treatment and home care may relieve symptoms. Treatment may include: Treating the underlying cause. Managing your symptoms by making changes to your diet and exercise habits. Taking medicines to control nausea and vomiting and to stimulate stomach muscles. Getting food through a  feeding tube in the hospital. This may be done in severe cases. Having surgery to insert a device called a gastric electrical stimulator  into your body. This device helps improve stomach emptying and control nausea and vomiting. Follow these instructions at home: Take over-the-counter and prescription medicines only as told by your health care provider. Follow instructions from your health care provider about eating or drinking restrictions. Your health care provider may recommend that you: Eat smaller meals more often. Eat low-fat foods. Eat low-fiber forms of high-fiber foods. For example, eat cooked vegetables instead of raw vegetables. Have only liquid foods instead of solid foods. Liquid foods are easier to digest. Drink enough fluid to keep your urine pale yellow. Exercise as often as told by your health care provider. Keep all follow-up visits. This is important. Contact a health care provider if you: Notice that your symptoms do not improve with treatment. Have new symptoms. Get help right away if you: Have severe pain in your abdomen that does not improve with treatment. Have nausea that is severe or does not go away. Vomit every time you drink fluids. Summary Gastroparesis is a long-term (chronic) condition in which food takes longer than normal to empty from the stomach. Symptoms include nausea, vomiting, heartburn, bloating of your abdomen, and loss of appetite. Eating smaller portions, low-fat foods, and low-fiber forms of high-fiber foods may help you manage your symptoms. Get help right away if you have severe pain in your abdomen. This information is not intended to replace advice given to you by your health care provider. Make sure you discuss any questions you have with your health care provider. Document Revised: 08/29/2019 Document Reviewed: 08/29/2019 Elsevier Patient Education  2021 [place] provider has requested that you go to the basement level for lab work before leaving today. Press "B" on the elevator. The lab is located at the first door on the left as you exit the elevator.   Before or after go to Xray which is to the right.

## 2021-12-04 LAB — LACTIC ACID, PLASMA: LACTIC ACID: 0.9 mmol/L (ref 0.4–1.8)

## 2021-12-09 NOTE — Progress Notes (Signed)
Attending Physician's Attestation   I have reviewed the chart.   I agree with the Advanced Practitioner's note, impression, and recommendations with any updates as below. In Dr. Eugenia Pancoast absence I have reviewed this patient's chart.  Agree with plan of action by [facility].  Other considerations would be based on completion of the workup the consideration of repeat upper endoscopy though on CT scan it looks like this was more of a jejunal process.  CT enterography in 6 weeks may be reasonable to consider an evaluation and ensuring nothing else is being missed, especially if patient's symptoms persist.   Justice Britain, MD [facility] Gastroenterology Advanced Endoscopy Office # [phone]

## 2021-12-10 ENCOUNTER — Telehealth: Payer: Self-pay

## 2021-12-10 NOTE — Telephone Encounter (Signed)
Vladimir Crofts, PA-C  [place], Karysa Heft A, RN  If not improving, per Dr. Rush Landmark recommendations, please schedule for CT enterography 4 weeks for further evaluation of small bowel and set up for follow up in OV afterwards.  Thanks,  Estill Bamberg

## 2021-12-10 NOTE — Telephone Encounter (Signed)
Called to check in with patient to see how she was feeling & she stated she was feeling much better, and is back to having normal bowel movements. She has been advised to call back if symptoms return or worsen, and we will set up CT. Pt verbalized all understanding.

## 2021-12-11 ENCOUNTER — Other Ambulatory Visit: Payer: Self-pay

## 2021-12-11 ENCOUNTER — Other Ambulatory Visit (HOSPITAL_COMMUNITY): Payer: Self-pay

## 2021-12-11 ENCOUNTER — Telehealth: Payer: Self-pay | Admitting: Pharmacy Technician

## 2021-12-11 ENCOUNTER — Telehealth: Payer: Self-pay

## 2021-12-11 MED ORDER — METOCLOPRAMIDE HCL 5 MG PO TABS: 5.0000 mg | ORAL_TABLET | Freq: Three times a day (TID) | ORAL | 0 refills | Status: DC

## 2021-12-11 NOTE — Telephone Encounter (Signed)
Patient's husband called in stating patient "was experiencing another attack" and has been experiencing nausea & vomiting frequently for most of the morning. She did have a bowel movement this morning that was normal. She is starting to feel weak/dizzy. When we spoke yesterday patient was doing fine & said she was doing much better. She has been off reglan for 2 days, and has zofran PRN that she has taken. Husband is concerned and would like [place], [place] to be aware & provide any further recommendations.

## 2021-12-11 NOTE — Telephone Encounter (Signed)
Spoke with patient's husband regarding PA recommendations. [place] are currently at the beach, so reglan has been sent to a [facility] in [facility]. He plans to pick up the motegrity on Friday when they return. Confirmed dosing verbally with Estill Bamberg, [place]. Samples placed at 2nd floor front desk. Since we last talked, patient has been sleeping and has not vomited any further. ED precautions have been given, and husband advised to call back with any further questions or concerns.

## 2021-12-11 NOTE — Telephone Encounter (Signed)
Patient Advocate Encounter  Received notification from [place] that prior authorization for [facility] is required.   PA submitted on 8.9.23 Key [id number] Status is pending    Luciano Cutter, CPhT Patient Advocate Phone: [phone]

## 2021-12-11 NOTE — Telephone Encounter (Signed)
PT spouse is calling tbout an update on wife's medication. Please reach out to advise. Thank you

## 2021-12-11 NOTE — Telephone Encounter (Signed)
Refill Reglan for patient to take up to 3 times daily for another 10 days. Will try and send in [place] as this may help nausea and bowel movements. Continue Zofran as needed. Go to the ER if unable to pass gas, severe AB pain, unable to hold down food, any shortness of breath of chest pain.

## 2021-12-16 NOTE — Telephone Encounter (Signed)
Patient called in with complaints of ongoing nausea w/vomiting, generalized abdominal & lower back pain (6/10), and fever of 100.9. She did vomit yesterday, but has mostly been dry heaving today. Bowel movements normal. She is currently taking Reglan & Zofran as prescribed. She is not tolerating any food, however still able to hold down liquids. She says the last time she developed a fever & lower back pain, she was admitted for sepsis (1 month ago). Will route to PA for recommendations.

## 2021-12-16 NOTE — Telephone Encounter (Signed)
Left message for patient to call back  

## 2021-12-16 NOTE — Telephone Encounter (Signed)
Spoke with patient regarding PA recommendations. Pt verbalized all understanding. 

## 2021-12-16 NOTE — Telephone Encounter (Signed)
Patient called wanted to let Vicie Mutters PA-C know she had a fever of 109 and she also has been vomiting.

## 2021-12-23 ENCOUNTER — Other Ambulatory Visit (HOSPITAL_COMMUNITY): Payer: Self-pay

## 2021-12-26 ENCOUNTER — Other Ambulatory Visit (HOSPITAL_COMMUNITY): Payer: Self-pay

## 2021-12-30 NOTE — Telephone Encounter (Signed)
Received a fax with approval of her Linzess from 11/12/2021-12/12/2022. They have notified the patient with a letter also.

## 2022-01-24 ENCOUNTER — Other Ambulatory Visit: Payer: Self-pay | Admitting: Family Medicine

## 2022-01-28 ENCOUNTER — Other Ambulatory Visit (HOSPITAL_COMMUNITY): Payer: Self-pay

## 2022-02-04 ENCOUNTER — Encounter: Payer: Self-pay | Admitting: Family Medicine

## 2022-02-04 ENCOUNTER — Ambulatory Visit: Payer: Federal, State, Local not specified - PPO | Admitting: Family Medicine

## 2022-02-04 VITALS — BP 129/81 | HR 76 | Temp 97.7°F | Wt 149.2 lb

## 2022-02-04 DIAGNOSIS — F5101 Primary insomnia: Secondary | ICD-10-CM

## 2022-02-04 DIAGNOSIS — R7303 Prediabetes: Secondary | ICD-10-CM | POA: Diagnosis not present

## 2022-02-04 DIAGNOSIS — F418 Other specified anxiety disorders: Secondary | ICD-10-CM | POA: Diagnosis not present

## 2022-02-04 DIAGNOSIS — Z23 Encounter for immunization: Secondary | ICD-10-CM

## 2022-02-04 MED ORDER — BUPROPION HCL ER (XL) 150 MG PO TB24: 150.0000 mg | ORAL_TABLET | Freq: Every day | ORAL | 1 refills | Status: DC

## 2022-02-04 MED ORDER — CLONAZEPAM 0.5 MG PO TABS: ORAL_TABLET | ORAL | 5 refills | Status: DC

## 2022-02-04 MED ORDER — ESCITALOPRAM OXALATE 20 MG PO TABS: 20.0000 mg | ORAL_TABLET | Freq: Every day | ORAL | 1 refills | Status: DC

## 2022-02-04 NOTE — Progress Notes (Signed)
Patient ID: Lindsay Hamilton, female  DOB: 11-15-1965, 56 y.o.   MRN: [id number] Patient Care Team    Relationship Specialty Notifications Start End  [name], DO PCP - General Family Medicine  04/15/19   Sharia Reeve, MD Referring Physician Psychiatry  09/14/13   Garnet Sierras, DO Consulting Physician Allergy  03/17/19   Clarene Essex, MD Consulting Physician Gastroenterology  04/15/19   Vickey Huger, MD Consulting Physician Orthopedic Surgery  04/15/19   Donia Ast, [place]  Orthopedic Surgery  04/15/19   Roel Cluck, MD Referring Physician Ophthalmology  04/15/19   Lelon Perla, MD Consulting Physician Cardiology  04/15/19   Kathrynn Ducking, MD (Inactive) Consulting Physician Neurology  04/15/19   Arvella Nigh, MD Consulting Physician Obstetrics and Gynecology  04/15/19   Juluis Rainier  Optometry  04/15/19   Clabe Seal, DMD  Orthodontics  04/15/19     Chief Complaint  Patient presents with   Anxiety    Pt is fasting    Subjective: Lindsay Hamilton is a 56 y.o.  Female  present for [facility]. All past medical history, surgical history, allergies, family history, immunizations, medications and social history were updated in the electronic medical record today. All recent labs, ED visits and hospitalizations within the last year were reviewed.  Insomnia, unspecified type/Depression with anxiety Patient states med regimen is working well for her. She reports compliance with Wellbutrin 150 mg daily and Lexapro 20 mg daily.  She reports compliance with klonopin 0.5 mg 1/2 tab in the day and 1-2 tabs qhs.  Prior note: Patient reports overall she is doing well since the changes in medication.  She is sleeping well with using the Klonopin 0.5 mg nightly.  She has not used the [place] and feels she is getting good sleep with the Klonopin.  She does endorse having some increase in her emotional state, feeling more tearful at times since stopping the  Lunesta and the Xanax.  She has been taking the Xanax twice daily.  However she has only been taking the Klonopin at night.  It was written to allow her to have 1 Klonopin before bed and a half a tab during the day.  She states she has not been taking the Klonopin during the day.       02/04/2022    8:24 AM 08/19/2021    2:37 PM 03/04/2021    2:08 PM 01/28/2021    9:19 AM 08/08/2020   10:37 AM  Depression screen PHQ 2/9  Decreased Interest 0 2 0 0   Down, Depressed, Hopeless 1 2 0 1 1  PHQ - 2 Score 1 4 0 1 1  Altered sleeping '1 2  1 2  '$ Tired, decreased energy '1 1  1 1  '$ Change in appetite 0 0  0 1  Feeling bad or failure about yourself  1 0  0 0  Trouble concentrating 1 3  0 0  Moving slowly or fidgety/restless '1 1  1 1  '$ Suicidal thoughts 0 0  0 0  PHQ-9 Score '6 11  4 6      '$ 02/04/2022    8:24 AM 08/19/2021    2:37 PM 01/28/2021    9:20 AM 08/08/2020   10:37 AM  GAD 7 : Generalized Anxiety Score  Nervous, Anxious, on Edge '1 1 1 2  '$ Control/stop worrying 1 1 0 0  Worry too much - different things 1 1 1  1  Trouble relaxing '1 2 2 '$ 0  Restless '1 1 1   '$ Easily annoyed or irritable '1 1 2 1  '$ Afraid - awful might happen 0 0 0 0  Total GAD 7 Score '6 7 7     '$ Immunization History  Administered Date(s) Administered   Influenza Split 04/08/2013, 02/17/2015, 02/16/2019   Influenza,inj,Quad PF,6+ Mos 01/21/2021, 02/04/2022   Influenza-Unspecified 02/16/2019, 02/03/2020, 02/14/2020   PFIZER(Purple Top)SARS-COV-2 Vaccination 05/17/2019, 06/07/2019, 01/27/2020, 01/21/2021   Pfizer Covid-19 Vaccine Bivalent Booster 32yr & up 01/21/2021   Tdap 04/08/2013   Zoster Recombinat (Shingrix) 07/05/2019, 12/23/2019   Zoster, Live 07/18/2019     Past Medical History:  Diagnosis Date   Alcohol abuse, in remission    Depression    Head injury    Headaches due to old head injury    Hypokalemia 11/13/2021   Hyponatremia 11/12/2021   Ingrowing nail 02/20/2020   Insomnia 09/14/2013   Migraine without  aura, without mention of intractable migraine without mention of status migrainosus 09/14/2013   Obese    Pancreatitis    Pseudoseizures    Pyelonephritis 11/26/2021   Right upper quadrant pain 06/26/2020   [place] 11/12/20[place]   Tubular adenoma of colon 2018   No Known Allergies Past Surgical History:  Procedure Laterality Date   ABDOMINAL HYSTERECTOMY  2009   Partial   CARPAL TUNNEL RELEASE  1998   CESAREAN SECTION     x2-1992, 1997   CHOLECYSTECTOMY  2002   COLONOSCOPY W/ BIOPSIES  05/2016   tubular adenoma   GREAT TOE ARTHRODESIS, METATARSALPHALANGEAL JOINT Right 07/17/2018   KNEE ARTHROSCOPY     bilateral, 3 right and 3 left   SHOULDER ARTHROSCOPY Bilateral    Right 2012, left 2014   Family History  Problem Relation Age of Onset   Hypertension Mother    CAD Mother        Died of MI at age 55  Asthma Mother    Pulmonary embolism Mother        x2   Cancer - Prostate Father    Atrial fibrillation Father    Hyperlipidemia Father    Seizures Sister    Migraines Sister    Hypertension Brother    Hypertension Brother    Lung cancer Paternal Grandmother    Breast cancer Neg Hx    Allergic rhinitis Neg Hx    Eczema Neg Hx    Urticaria Neg Hx    Social History   Social History Narrative   Marital status/children/pets: Married.  2 children.   Education/employment: High school graduate with some college.  Works as a sStage manager   Safety:      -smoke alarm in the home:Yes     - wears seatbelt: Yes     - Feels safe in their relationships: Yes    Allergies as of 02/04/2022   No Known Allergies      Medication List        Accurate as of February 04, 2022  8:25 AM. If you have any questions, ask your nurse or doctor.          STOP taking these medications    famotidine 20 MG tablet Commonly known as: Pepcid Stopped by: RHoward Pouch DO   linaclotide 290 MCG Caps capsule Commonly known as: Linzess Stopped by: RHoward Pouch  DO   metoCLOPramide 5 MG tablet Commonly known as: Reglan Stopped by: RHoward Pouch DO   pantoprazole 40 MG tablet Commonly known  as: PROTONIX Stopped by: Howard Pouch, DO   sucralfate 1 g tablet Commonly known as: Carafate Stopped by: Howard Pouch, DO       TAKE these medications    buPROPion 150 MG 24 hr tablet Commonly known as: WELLBUTRIN XL Take 1 tablet (150 mg total) by mouth daily. What changed: when to take this   clonazePAM 0.5 MG tablet Commonly known as: KLONOPIN 1/2 tab during the day and 1-2 tabs QHS and What changed:  how much to take how to take this when to take this additional instructions   escitalopram 20 MG tablet Commonly known as: LEXAPRO Take 1 tablet (20 mg total) by mouth at bedtime.   gabapentin 100 MG capsule Commonly known as: NEURONTIN Take 100 mg by mouth every evening.   ondansetron 4 MG disintegrating tablet Commonly known as: ZOFRAN-ODT Take 1 tablet (4 mg total) by mouth every 8 (eight) hours as needed for nausea or vomiting. '4mg'$  ODT q4 hours prn nausea/vomit        All past medical history, surgical history, allergies, family history, immunizations andmedications were updated in the EMR today and reviewed under the history and medication portions of their EMR.       ROS 14 pt review of systems performed and negative (unless mentioned in an HPI)  Objective: BP 129/81   Pulse 76   Temp 97.7 F (36.5 C)   Wt 149 lb 3.2 oz (67.7 kg)   SpO2 97%   BMI 25.61 kg/m  Physical Exam Vitals and nursing note reviewed.  Constitutional:      General: She is not in acute distress.    Appearance: Normal appearance. She is not ill-appearing, toxic-appearing or diaphoretic.  HENT:     Head: Normocephalic and atraumatic.  Eyes:     General: No scleral icterus.       Right eye: No discharge.        Left eye: No discharge.     Extraocular Movements: Extraocular movements intact.     Conjunctiva/sclera: Conjunctivae normal.      Pupils: Pupils are equal, round, and reactive to light.  Cardiovascular:     Rate and Rhythm: Normal rate and regular rhythm.  Pulmonary:     Effort: Pulmonary effort is normal. No respiratory distress.     Breath sounds: Normal breath sounds. No wheezing, rhonchi or rales.  Musculoskeletal:     Cervical back: Neck supple. No tenderness.  Lymphadenopathy:     Cervical: No cervical adenopathy.  Skin:    General: Skin is warm and dry.     Coloration: Skin is not jaundiced or pale.     Findings: No erythema or rash.  Neurological:     Mental Status: She is alert and oriented to person, place, and time. Mental status is at baseline.     Motor: No weakness.     Gait: Gait normal.  Psychiatric:        Mood and Affect: Mood normal.        Behavior: Behavior normal.        Thought Content: Thought content normal.        Judgment: Judgment normal.     No results found.  Assessment/plan: Lindsay Hamilton is a 56 y.o. female present for [facility] Insomnia, unspecified type/Depression with anxiety Stable  Continue Wellbutrin 150 mg daily.   Continue  Lexapro 20 mg daily. Continue Klonopin with mild increase in night dose 0.5-1 mg nightly and use half a tab 12 hours later  if needed only.  Discontinued Lunesta and Xanax -04/15/2019. [place] controlled substance database reviewed 08/08/20 controlled substance signed uds UTD Patient counseled on addictive properties of benzodiazepines. Follow-up 5-6 months, sooner if needed   Return in about 7 months (around 08/21/2022) for cpe (20 min), Routine chronic condition follow-up.  Orders Placed This Encounter  Procedures   Flu Vaccine QUAD 19moIM (Fluarix, Fluzone & Alfiuria Quad PF)    Orders Placed This Encounter  Procedures   Flu Vaccine QUAD 689moM (Fluarix, Fluzone & Alfiuria Quad PF)   Meds ordered this encounter  Medications   clonazePAM (KLONOPIN) 0.5 MG tablet    Sig: 1/2 tab during the day and 1-2 tabs QHS and     Dispense:  75 tablet    Refill:  5   escitalopram (LEXAPRO) 20 MG tablet    Sig: Take 1 tablet (20 mg total) by mouth at bedtime.    Dispense:  90 tablet    Refill:  1   buPROPion (WELLBUTRIN XL) 150 MG 24 hr tablet    Sig: Take 1 tablet (150 mg total) by mouth daily.    Dispense:  90 tablet    Refill:  1   Referral Orders  No referral(s) requested today     Electronically signed by: ReHoward PouchDOLeon

## 2022-02-04 NOTE — Patient Instructions (Addendum)
Return in about 7 months (around 08/21/2022) for cpe (20 min), Routine chronic condition follow-up.        Great to see you today.  I have refilled the medication(s) we provide.   If labs were collected, we will inform you of lab results once received either by echart message or telephone call.   - echart message- for normal results that have been seen by the patient already.   - telephone call: abnormal results or if patient has not viewed results in their echart.

## 2022-03-04 ENCOUNTER — Other Ambulatory Visit: Payer: Self-pay | Admitting: Gastroenterology

## 2022-03-06 ENCOUNTER — Other Ambulatory Visit: Payer: Self-pay

## 2022-03-06 ENCOUNTER — Telehealth: Payer: Self-pay | Admitting: Gastroenterology

## 2022-03-06 MED ORDER — LINACLOTIDE 72 MCG PO CAPS: 72.0000 ug | ORAL_CAPSULE | Freq: Every day | ORAL | 0 refills | Status: DC

## 2022-03-06 NOTE — Telephone Encounter (Signed)
Spoke with patient and she is requesting refill for 72 mcg of Linzess. Refill sent in to [facility].

## 2022-03-06 NOTE — Telephone Encounter (Signed)
Inbound call from patient requesting medication refill for linzess. Please advise

## 2022-03-17 ENCOUNTER — Other Ambulatory Visit: Payer: Self-pay

## 2022-03-17 ENCOUNTER — Telehealth: Payer: Self-pay | Admitting: Gastroenterology

## 2022-03-17 DIAGNOSIS — R197 Diarrhea, unspecified: Secondary | ICD-10-CM

## 2022-03-17 DIAGNOSIS — K921 Melena: Secondary | ICD-10-CM

## 2022-03-17 NOTE — Telephone Encounter (Signed)
Spoke with patient regarding MD recommendations. Orders placed. She plans to come in tomorrow for lab work & has been advised on when/where to go for labs.

## 2022-03-17 NOTE — Telephone Encounter (Signed)
Pt last saw Vicie Mutters will send to her nurse

## 2022-03-17 NOTE — Telephone Encounter (Signed)
Please proceed with having patient come in for STAT CBC/Iron/TIBC/Ferritin/CMP/Lipase/Amylase. With prior history if no severe anemia is noted then likely will need an EGD +/- CTAP with prior history of enteritis. If endoscopic evaluation is required will have to consider [facility] based on labs. If evidence of elevated pancreatic enzymes then will need imaging before Endoscopy. Thanks. GM

## 2022-03-17 NOTE — Telephone Encounter (Signed)
Patient called in with complaints of black diarrhea for the last two weeks (6-7/day) & some occasional lower back pain. She did have some nausea last night w/o vomiting that has resolved. She has tried taking pepto (stools were black ever prior to pepto) with no relief. Last seen in [place] on 12/03/21 with Amanda,PA. Pt seeking further recommendations.

## 2022-03-17 NOTE — Telephone Encounter (Signed)
Inbound call from patient stating that for the last 2 weeks she has been having black diarrhea and is requesting a call back to discuss. Please advise.

## 2022-03-18 ENCOUNTER — Other Ambulatory Visit (INDEPENDENT_AMBULATORY_CARE_PROVIDER_SITE_OTHER): Payer: Federal, State, Local not specified - PPO

## 2022-03-18 DIAGNOSIS — K921 Melena: Secondary | ICD-10-CM

## 2022-03-18 DIAGNOSIS — R197 Diarrhea, unspecified: Secondary | ICD-10-CM

## 2022-03-18 LAB — COMPREHENSIVE METABOLIC PANEL
ALT: 26 U/L (ref 0–35)
AST: 19 U/L (ref 0–37)
Albumin: 4.3 g/dL (ref 3.5–5.2)
Alkaline Phosphatase: 54 U/L (ref 39–117)
BUN: 13 mg/dL (ref 6–23)
CO2: 28 mEq/L (ref 19–32)
Calcium: 9.5 mg/dL (ref 8.4–10.5)
Chloride: 104 mEq/L (ref 96–112)
Creatinine, Ser: 0.66 mg/dL (ref 0.40–1.20)
GFR: 98.45 mL/min (ref >60.00)
Glucose, Bld: 114 mg/dL — ABNORMAL HIGH (ref 70–99)
Potassium: 4.3 mEq/L (ref 3.5–5.1)
Sodium: 140 mEq/L (ref 135–145)
Total Bilirubin: 0.3 mg/dL (ref 0.2–1.2)
Total Protein: 7.4 g/dL (ref 6.0–8.3)

## 2022-03-18 LAB — CBC
HCT: 41 % (ref 36.0–46.0)
Hemoglobin: 13.3 g/dL (ref 12.0–15.0)
MCHC: 32.4 g/dL (ref 30.0–36.0)
MCV: 89.4 fl (ref 78.0–100.0)
Platelets: 220 10*3/uL (ref 150.0–400.0)
RBC: 4.59 Mil/uL (ref 3.87–5.11)
RDW: 14.3 % (ref 11.5–15.5)
WBC: 5.6 10*3/uL (ref 4.0–10.5)

## 2022-03-18 LAB — LIPASE: Lipase: 62 U/L — ABNORMAL HIGH (ref 11.0–59.0)

## 2022-03-18 LAB — IBC + FERRITIN
Ferritin: 89.6 ng/mL (ref 10.0–291.0)
Iron: 31 ug/dL — ABNORMAL LOW (ref 42–145)
Saturation Ratios: 10.1 % — ABNORMAL LOW (ref 20.0–50.0)
TIBC: 308 ug/dL (ref 250.0–450.0)
Transferrin: 220 mg/dL (ref 212.0–360.0)

## 2022-03-18 LAB — AMYLASE: Amylase: 46 U/L (ref 27–131)

## 2022-03-20 ENCOUNTER — Other Ambulatory Visit: Payer: Self-pay

## 2022-03-20 DIAGNOSIS — R748 Abnormal levels of other serum enzymes: Secondary | ICD-10-CM

## 2022-03-20 DIAGNOSIS — R1013 Epigastric pain: Secondary | ICD-10-CM

## 2022-03-31 ENCOUNTER — Other Ambulatory Visit (INDEPENDENT_AMBULATORY_CARE_PROVIDER_SITE_OTHER): Payer: Federal, State, Local not specified - PPO

## 2022-03-31 DIAGNOSIS — R1013 Epigastric pain: Secondary | ICD-10-CM

## 2022-03-31 DIAGNOSIS — R748 Abnormal levels of other serum enzymes: Secondary | ICD-10-CM | POA: Diagnosis not present

## 2022-03-31 LAB — CBC WITH DIFFERENTIAL/PLATELET
Basophils Absolute: 0 10*3/uL (ref 0.0–0.1)
Basophils Relative: 0.7 % (ref 0.0–3.0)
Eosinophils Absolute: 0.1 10*3/uL (ref 0.0–0.7)
Eosinophils Relative: 1.4 % (ref 0.0–5.0)
HCT: 40.1 % (ref 36.0–46.0)
Hemoglobin: 13.2 g/dL (ref 12.0–15.0)
Lymphocytes Relative: 36.9 % (ref 12.0–46.0)
Lymphs Abs: 1.9 10*3/uL (ref 0.7–4.0)
MCHC: 32.9 g/dL (ref 30.0–36.0)
MCV: 89.2 fl (ref 78.0–100.0)
Monocytes Absolute: 0.2 10*3/uL (ref 0.1–1.0)
Monocytes Relative: 4.9 % (ref 3.0–12.0)
Neutro Abs: 2.9 10*3/uL (ref 1.4–7.7)
Neutrophils Relative %: 56.1 % (ref 43.0–77.0)
Platelets: 228 10*3/uL (ref 150.0–400.0)
RBC: 4.49 Mil/uL (ref 3.87–5.11)
RDW: 14.3 % (ref 11.5–15.5)
WBC: 5.1 10*3/uL (ref 4.0–10.5)

## 2022-03-31 LAB — HEPATIC FUNCTION PANEL
ALT: 25 U/L (ref 0–35)
AST: 18 U/L (ref 0–37)
Albumin: 4.3 g/dL (ref 3.5–5.2)
Alkaline Phosphatase: 44 U/L (ref 39–117)
Bilirubin, Direct: 0.1 mg/dL (ref 0.0–0.3)
Total Bilirubin: 0.5 mg/dL (ref 0.2–1.2)
Total Protein: 7.1 g/dL (ref 6.0–8.3)

## 2022-03-31 LAB — LIPASE: Lipase: 49 U/L (ref 11.0–59.0)

## 2022-04-01 ENCOUNTER — Telehealth: Payer: Self-pay | Admitting: Physician Assistant

## 2022-04-01 NOTE — Telephone Encounter (Signed)
Spoke with patient & advised she keep EGD as scheduled d/t recent anemia seen on labs. Pt verbalized all understanding.

## 2022-04-01 NOTE — Telephone Encounter (Signed)
Pt called states she got a call about her labs yesterday and everything was good so she is wondering if she still needs to have her endoscopy or not. Please advise

## 2022-04-04 ENCOUNTER — Ambulatory Visit (AMBULATORY_SURGERY_CENTER): Payer: Federal, State, Local not specified - PPO

## 2022-04-04 VITALS — Ht 64.0 in | Wt 154.0 lb

## 2022-04-04 DIAGNOSIS — R1013 Epigastric pain: Secondary | ICD-10-CM

## 2022-04-04 DIAGNOSIS — R748 Abnormal levels of other serum enzymes: Secondary | ICD-10-CM

## 2022-04-04 DIAGNOSIS — D649 Anemia, unspecified: Secondary | ICD-10-CM

## 2022-04-04 NOTE — Progress Notes (Signed)
No egg or soy allergy known to patient  No issues known to pt with past sedation with any surgeries or procedures Patient denies ever being told they had issues or difficulty with intubation  No FH of Malignant Hyperthermia Pt is not on diet pills Pt is not on home 02  Pt is not on blood thinners  Pt denies issues with constipation  No A fib or A flutter Have any cardiac testing pending--NO  Insurance verified during PV appt=[employer]  Patient's chart reviewed by Osvaldo Angst CNRA prior to previsit and patient appropriate for the [place].  Previsit completed and red dot placed by patient's name on their procedure day (on provider's schedule).

## 2022-04-12 ENCOUNTER — Encounter: Payer: Self-pay | Admitting: Gastroenterology

## 2022-04-12 ENCOUNTER — Ambulatory Visit (AMBULATORY_SURGERY_CENTER): Payer: Federal, State, Local not specified - PPO | Admitting: Gastroenterology

## 2022-04-12 VITALS — BP 108/71 | HR 60 | Temp 98.0°F | Resp 20 | Ht 64.0 in | Wt 154.0 lb

## 2022-04-12 DIAGNOSIS — K219 Gastro-esophageal reflux disease without esophagitis: Secondary | ICD-10-CM | POA: Diagnosis not present

## 2022-04-12 DIAGNOSIS — R1013 Epigastric pain: Secondary | ICD-10-CM

## 2022-04-12 DIAGNOSIS — K449 Diaphragmatic hernia without obstruction or gangrene: Secondary | ICD-10-CM

## 2022-04-12 DIAGNOSIS — R748 Abnormal levels of other serum enzymes: Secondary | ICD-10-CM

## 2022-04-12 MED ORDER — SODIUM CHLORIDE 0.9 % IV SOLN: 500.0000 mL | Freq: Once | INTRAVENOUS | Status: DC

## 2022-04-12 NOTE — Progress Notes (Unsigned)
Sedate, gd SR, tolerated procedure well, VSS, report to RN 

## 2022-04-12 NOTE — Progress Notes (Unsigned)
Called to room to assist during endoscopic procedure.  Patient ID and intended procedure confirmed with present staff. Received instructions for my participation in the procedure from the performing physician.  

## 2022-04-12 NOTE — Progress Notes (Unsigned)
GASTROENTEROLOGY PROCEDURE H&P NOTE   Primary Care Physician: [name], DO  HPI: Lindsay Hamilton is a 56 y.o. female who presents for EGD for evalaution of elevated lipase, epigastric pain, solid food dysphagia.  Past Medical History:  Diagnosis Date   Alcohol abuse, in remission    Depression    on meds   GERD (gastroesophageal reflux disease)    on meds   Head injury    Headaches due to old head injury    Hypokalemia 11/13/2021   Hyponatremia 11/12/2021   Ingrowing nail 02/20/2020   Insomnia 09/14/2013   Migraine without aura, without mention of intractable migraine without mention of status migrainosus 09/14/2013   Obese    Pancreatitis    Pseudoseizures    Pyelonephritis 11/26/2021   Right upper quadrant pain 06/26/2020   Severe sepsis ([place]) 11/12/2021   Tubular adenoma of colon 2018   Past Surgical History:  Procedure Laterality Date   ABDOMINAL HYSTERECTOMY  2009   Partial   CARPAL TUNNEL RELEASE  1998   CESAREAN SECTION     x2-1992, 1997   CHOLECYSTECTOMY  2002   COLONOSCOPY     COLONOSCOPY W/ BIOPSIES  05/2016   tubular adenoma   GREAT TOE ARTHRODESIS, METATARSALPHALANGEAL JOINT Right 07/17/2018   HAND ARTHROPLASTY Left 2022   [place] arthorplasty   KNEE ARTHROSCOPY     bilateral, 3 right and 3 left   SHOULDER ARTHROSCOPY Bilateral    Right 2012, left 2014   UPPER GASTROINTESTINAL ENDOSCOPY  2022   DJ-MAC-dysphagia/heartburn-dilated to 98m-   Current Outpatient Medications  Medication Sig Dispense Refill   buPROPion (WELLBUTRIN XL) 150 MG 24 hr tablet Take 1 tablet (150 mg total) by mouth daily. 90 tablet 1   clonazePAM (KLONOPIN) 0.5 MG tablet 1/2 tab during the day and 1-2 tabs QHS and 75 tablet 5   escitalopram (LEXAPRO) 20 MG tablet Take 1 tablet (20 mg total) by mouth at bedtime. 90 tablet 1   gabapentin (NEURONTIN) 100 MG capsule Take 100 mg by mouth every evening.     linaclotide (LINZESS) 72 MCG capsule Take 1 capsule (72 mcg total)  by mouth daily before breakfast. 90 capsule 0   omeprazole (PRILOSEC) 40 MG capsule Take 40 mg by mouth daily.     ondansetron (ZOFRAN-ODT) 4 MG disintegrating tablet Take 1 tablet (4 mg total) by mouth every 8 (eight) hours as needed for nausea or vomiting. 47mODT q4 hours prn nausea/vomit 30 tablet 2   Current Facility-Administered Medications  Medication Dose Route Frequency Provider Last Rate Last Admin   0.9 %  sodium chloride infusion  500 mL Intravenous Once Mansouraty, GaTelford Nab MD        Current Outpatient Medications:    buPROPion (WELLBUTRIN XL) 150 MG 24 hr tablet, Take 1 tablet (150 mg total) by mouth daily., Disp: 90 tablet, Rfl: 1   clonazePAM (KLONOPIN) 0.5 MG tablet, 1/2 tab during the day and 1-2 tabs QHS and, Disp: 75 tablet, Rfl: 5   escitalopram (LEXAPRO) 20 MG tablet, Take 1 tablet (20 mg total) by mouth at bedtime., Disp: 90 tablet, Rfl: 1   gabapentin (NEURONTIN) 100 MG capsule, Take 100 mg by mouth every evening., Disp: , Rfl:    linaclotide (LINZESS) 72 MCG capsule, Take 1 capsule (72 mcg total) by mouth daily before breakfast., Disp: 90 capsule, Rfl: 0   omeprazole (PRILOSEC) 40 MG capsule, Take 40 mg by mouth daily., Disp: , Rfl:    ondansetron (ZOFRAN-ODT)  4 MG disintegrating tablet, Take 1 tablet (4 mg total) by mouth every 8 (eight) hours as needed for nausea or vomiting. 79m ODT q4 hours prn nausea/vomit, Disp: 30 tablet, Rfl: 2  Current Facility-Administered Medications:    0.9 %  sodium chloride infusion, 500 mL, Intravenous, Once, Mansouraty, GTelford Nab, MD No Known Allergies Family History  Problem Relation Age of Onset   Hypertension Mother    CAD Mother        Died of MI at age 55  Asthma Mother    Pulmonary embolism Mother        x2   Cancer - Prostate Father    Atrial fibrillation Father    Hyperlipidemia Father    Seizures Sister    Migraines Sister    Hypertension Brother    Hypertension Brother    Lung cancer Paternal Grandmother     Breast cancer Neg Hx    Allergic rhinitis Neg Hx    Eczema Neg Hx    Urticaria Neg Hx    Colon polyps Neg Hx    Colon cancer Neg Hx    Esophageal cancer Neg Hx    Stomach cancer Neg Hx    Rectal cancer Neg Hx    Social History   Socioeconomic History   Marital status: Married    Spouse name: Not on file   Number of children: 2   Years of education: college   Highest education level: Associate degree: occupational, tHotel manager or vocational program  Occupational History    Employer: [employer]    Comment: [employer]  Tobacco Use   Smoking status: Former    Types: Cigarettes    Quit date: 1997    Years since quitting: 26.9   Smokeless tobacco: Never  Vaping Use   Vaping Use: Never used  Substance and Sexual Activity   Alcohol use: Not Currently    Comment: social   Drug use: No   Sexual activity: Yes    Partners: Male    Birth control/protection: Surgical  Other Topics Concern   Not on file  Social History Narrative   Marital status/children/pets: Married.  2 children.   Education/employment: High school graduate with some college.  Works as a sStage manager   Safety:      -smoke alarm in the home:Yes     - wears seatbelt: Yes     - Feels safe in their relationships: Yes   Social Determinants of Health   Financial Resource Strain: Low Risk  (11/18/2021)   Overall Financial Resource Strain (CARDIA)    Difficulty of Paying Living Expenses: Not hard at all  Food Insecurity: No Food Insecurity (11/18/2021)   Hunger Vital Sign    Worried About Running Out of Food in the Last Year: Never true    Ran Out of Food in the Last Year: Never true  Transportation Needs: No Transportation Needs (11/18/2021)   PRAPARE - THydrologist(Medical): No    Lack of Transportation (Non-Medical): No  Physical Activity: Insufficiently Active (11/18/2021)   Exercise Vital Sign    Days of Exercise per Week: 3  days    Minutes of Exercise per Session: 30 min  Stress: Stress Concern Present (11/18/2021)   [place]   Feeling of Stress : To some extent  Social Connections: Moderately Integrated (11/18/2021)   Social Connection and Isolation Panel [NHANES]    Frequency of  Communication with Friends and Family: More than three times a week    Frequency of Social Gatherings with Friends and Family: Three times a week    Attends Religious Services: More than 4 times per year    Active Member of Clubs or Organizations: No    Attends Music therapist: Not on file    Marital Status: Married  Human resources officer Violence: Not on file    Physical Exam: Today's Vitals   04/12/22 0830  BP: 126/85  Pulse: 69  Temp: 98 F (36.7 C)  TempSrc: Skin  SpO2: 97%  Weight: 154 lb (69.9 kg)  Height: _0  (1.626 m)  PainSc: 4    Body mass index is 26.43 kg/m. GEN: NAD EYE: Sclerae anicteric ENT: MMM CV: Non-tachycardic GI: Soft, NT/ND NEURO:  Alert & Oriented x 3  Lab Results: No results for input(s): "WBC", "HGB", "HCT", "PLT" in the last 72 hours. BMET No results for input(s): "NA", "K", "CL", "CO2", "GLUCOSE", "BUN", "CREATININE", "CALCIUM" in the last 72 hours. LFT No results for input(s): "PROT", "ALBUMIN", "AST", "ALT", "ALKPHOS", "BILITOT", "BILIDIR", "IBILI" in the last 72 hours. PT/INR No results for input(s): "LABPROT", "INR" in the last 72 hours.   Impression / Plan: This is a 56 y.o.female who presents for EGD for evalaution of elevated lipase, epigastric pain, solid food dysphagia.  The risks and benefits of endoscopic evaluation/treatment were discussed with the patient and/or family; these include but are not limited to the risk of perforation, infection, bleeding, missed lesions, lack of diagnosis, severe illness requiring hospitalization, as well as anesthesia and sedation related illnesses.  The  patient's history has been reviewed, patient examined, no change in status, and deemed stable for procedure.  The patient and/or family is agreeable to proceed.    Justice Britain, MD [facility] Advanced Endoscopy Office # [phone]

## 2022-04-12 NOTE — Progress Notes (Unsigned)
Pt's states no medical or surgical changes since previsit or office visit. 

## 2022-04-12 NOTE — Patient Instructions (Addendum)
Clear liquids until 11:45, SOFT FOODS FOR THE REST OF TODAY. TOMORROW YOU MAY RESUME NORMAL DIET. CONTINUE PRESENT MEDICATIONS. USE CEPACOL OR HALL LOZENGES/ CHLORASEPTIC SPRAY FOR NEXT 72-96 HOURS FOR SORE THROAT IF NEEDED. RECOMMEND REPEAT CT SCAN OF ABDOMEN/PELVIS.   YOU HAD AN ENDOSCOPIC PROCEDURE TODAY AT THE [facility]:   Refer to the procedure report that was given to you for any specific questions about what was found during the examination.  If the procedure report does not answer your questions, please call your gastroenterologist to clarify.  If you requested that your care partner not be given the details of your procedure findings, then the procedure report has been included in a sealed envelope for you to review at your convenience later.  YOU SHOULD EXPECT: Some feelings of bloating in the abdomen. Passage of more gas than usual.  Walking can help get rid of the air that was put into your GI tract during the procedure and reduce the bloating. If you had a lower endoscopy (such as a colonoscopy or flexible sigmoidoscopy) you may notice spotting of blood in your stool or on the toilet paper. If you underwent a bowel prep for your procedure, you may not have a normal bowel movement for a few days.  Please Note:  You might notice some irritation and congestion in your nose or some drainage.  This is from the oxygen used during your procedure.  There is no need for concern and it should clear up in a day or so.  SYMPTOMS TO REPORT IMMEDIATELY:   Following upper endoscopy (EGD)  Vomiting of blood or coffee ground material  New chest pain or pain under the shoulder blades  Painful or persistently difficult swallowing  New shortness of breath  Fever of 100F or higher  Black, tarry-looking stools  For urgent or emergent issues, a gastroenterologist can be reached at any hour by calling [phone]. Do not use MyChart messaging for urgent concerns.    DIET:  Clear  liquids until 11:45, soft foods for the rest of today. Tomorrow you may proceed to your regular diet.  Drink plenty of fluids but you should avoid alcoholic beverages for 24 hours.  ACTIVITY:  You should plan to take it easy for the rest of today and you should NOT DRIVE or use heavy machinery until tomorrow (because of the sedation medicines used during the test).    FOLLOW UP: Our staff will call the number listed on your records the next business day following your procedure.  We will call around 7:15- 8:00 am to check on you and address any questions or concerns that you may have regarding the information given to you following your procedure. If we do not reach you, we will leave a message.     If any biopsies were taken you will be contacted by phone or by letter within the next 1-3 weeks.  Please call us at [phone] if you have not heard about the biopsies in 3 weeks.    SIGNATURES/CONFIDENTIALITY: You and/or your care partner have signed paperwork which will be entered into your electronic medical record.  These signatures attest to the fact that that the information above on your After Visit Summary has been reviewed and is understood.  Full responsibility of the confidentiality of this discharge information lies with you and/or your care-partner.

## 2022-04-12 NOTE — Op Note (Signed)
[facility] Patient Name: Lindsay Hamilton Procedure Date: 04/12/2022 9:29 AM MRN: [id number] Endoscopist: Justice Britain , MD, [id number] Age: 56 Referring MD:  Date of Birth: 09/02/1965 Gender: Female Account #: [id number] Procedure:                Upper GI endoscopy Indications:              Epigastric abdominal pain, Dyspepsia, Dysphagia,                            elevated lipase Medicines:                Monitored Anesthesia Care Procedure:                Pre-Anesthesia Assessment:                           - Prior to the procedure, a History and Physical                            was performed, and patient medications and                            allergies were reviewed. The patient's tolerance of                            previous anesthesia was also reviewed. The risks                            and benefits of the procedure and the sedation                            options and risks were discussed with the patient.                            All questions were answered, and informed consent                            was obtained. Prior Anticoagulants: The patient has                            taken no anticoagulant or antiplatelet agents. ASA                            Grade Assessment: II - A patient with mild systemic                            disease. After reviewing the risks and benefits,                            the patient was deemed in satisfactory condition to                            undergo the procedure.  After obtaining informed consent, the endoscope was                            passed under direct vision. Throughout the                            procedure, the patient's blood pressure, pulse, and                            oxygen saturations were monitored continuously. The                            GIF [id number] #[id number] was introduced through the                            mouth, and advanced to the second  part of duodenum.                            The upper GI endoscopy was accomplished without                            difficulty. The patient tolerated the procedure. Scope In: Scope Out: Findings:                 No gross lesions were noted in the majority of the                            esophagus. Biopsies were taken with a cold forceps                            for histology to rule out eosinophilic/lymphocytic                            esophagitis. After the rest of the EGD was                            completed, a guidewire was placed and the scope was                            withdrawn. Dilation was performed with a Savary                            dilator with mild resistance at 18 mm. The dilation                            site was examined following endoscope reinsertion                            and showed mild mucosal disruption/wrent (just                            below the UES), mild improvement in luminal  narrowing and no perforation.                           Scattered islands of salmon-colored mucosa were                            present from 35 to 36 cm (the very distal                            esophagus). No other visible abnormalities were                            present. Biopsies were taken with a cold forceps                            for histology to rule in/out Barrett's.                           The Z-line was irregular and was found 36 cm from                            the incisors.                           A 2 cm hiatal hernia was present.                           Patchy mildly erythematous mucosa without bleeding                            was found in the gastric antrum.                           No other gross lesions were noted in the entire                            examined stomach. Biopsies were taken with a cold                            forceps for histology and Helicobacter pylori                             testing.                           No gross lesions were noted in the duodenal bulb,                            in the first portion of the duodenum and in the                            second portion of the duodenum. Biopsies were taken  with a cold forceps for histology. Complications:            No immediate complications. Estimated Blood Loss:     Estimated blood loss was minimal. Impression:               - No gross lesions in the majority of the                            esophagus. Biopsied. Dilated. Mucosal wrent noted                            just below the UES.                           - Salmon-colored mucosal islands suspicious for                            Barrett's esophagus noted in the very distal                            esophagus. Biopsied.                           - Z-line irregular, 36 cm from the incisors.                           - 2 cm hiatal hernia.                           - Erythematous mucosa in the antrum. No other gross                            lesions in the entire stomach. Biopsied.                           - No gross lesions in the duodenal bulb, in the                            first portion of the duodenum and in the second                            portion of the duodenum. Biopsied. Recommendation:           - The patient will be observed post-procedure,                            until all discharge criteria are met.                           - Discharge patient to home.                           - Patient has a contact number available for                            emergencies. The signs and  symptoms of potential                            delayed complications were discussed with the                            patient. Return to normal activities tomorrow.                            Written discharge instructions were provided to the                            patient.                           -  Please use Cepacol or Halls Lozenges +/-                            Chloraseptic spray for next 72-96 hours to aid in                            sore thoat should you experience this.                           - Continue present medications otherwise.                           - Await pathology results.                           - Monitor for signs/symptoms of bleeding,                            perforation, and infection. If issues please call                            our number to get further assistance as needed.                           - Repeat upper endoscopy PRN for retreatment. If                            patient's dysphagia symptoms improve for quite a                            while, repeat dilation can be considered. If                            however, no clinical improvement in regards to                            dysphagia in the coming weeks/months, then                            consideration of esophageal manometry should be had.                           -  In the setting of the patient's persisting                            symptoms, and minimal, although present gastritis,                            I recommend repeat CT abdomen/pelvis with IV and                            oral contrast in an effort of ruling out/excluding                            any other type of intra-abdominal pathology in the                            setting of her previous elevated lipase.                           - The findings and recommendations were discussed                            with the patient.                           - The findings and recommendations were discussed                            with the patient's family. Justice Britain, MD 04/12/2022 9:54:16 AM

## 2022-04-14 ENCOUNTER — Telehealth: Payer: Self-pay

## 2022-04-14 NOTE — Telephone Encounter (Signed)
  Follow up Call-     04/12/2022    8:30 AM 11/23/2020    1:57 PM  Call back number  Post procedure Call Back phone  # [phone]  Permission to leave phone message Yes Yes     Patient questions:  Do you have a fever, pain , or abdominal swelling? No. Pain Score  0 *  Have you tolerated food without any problems? yes  Have you been able to return to your normal activities? Yes.    Do you have any questions about your discharge instructions: Diet   No. Medications  No. Follow up visit  No.  Do you have questions or concerns about your Care? No.  Actions: * If pain score is 4 or above: No action needed, pain <4.

## 2022-04-17 ENCOUNTER — Encounter: Payer: Self-pay | Admitting: Gastroenterology

## 2022-05-12 ENCOUNTER — Ambulatory Visit
Admission: RE | Admit: 2022-05-12 | Discharge: 2022-05-12 | Disposition: A | Discharge status: Home / Self Care | Payer: Federal, State, Local not specified - PPO | Source: Ambulatory Visit | Attending: Family Medicine | Admitting: Family Medicine

## 2022-05-12 ENCOUNTER — Telehealth: Payer: Self-pay | Admitting: Family Medicine

## 2022-05-12 DIAGNOSIS — Z1231 Encounter for screening mammogram for malignant neoplasm of breast: Secondary | ICD-10-CM | POA: Diagnosis not present

## 2022-05-12 NOTE — Telephone Encounter (Signed)
Please assist with message below

## 2022-05-12 NOTE — Telephone Encounter (Signed)
Called patient and she expressed not having a good day and wanting an appointment with her PCP. Patient was offered next available appointment alongside resources to talk to someone today. Patient declined next available appointment and resources and stated she will be fine. Patient was advised of crisis line and to seek care at the nearest ED should she need further assistance. Patient understood and agreed.

## 2022-05-12 NOTE — Telephone Encounter (Signed)
Pt called around 1:30 about emotional incidents that recently happened regarding her daughter. I schedule her for Wednesday (next available) And she then states if I am here then. Please give patient a call.

## 2022-05-12 NOTE — Telephone Encounter (Signed)
FYI

## 2022-05-14 ENCOUNTER — Ambulatory Visit: Payer: Self-pay | Admitting: Family Medicine

## 2022-05-24 ENCOUNTER — Other Ambulatory Visit: Payer: Self-pay | Admitting: Physician Assistant

## 2022-05-26 DIAGNOSIS — D241 Benign neoplasm of right breast: Secondary | ICD-10-CM | POA: Diagnosis not present

## 2022-05-26 DIAGNOSIS — N62 Hypertrophy of breast: Secondary | ICD-10-CM | POA: Diagnosis not present

## 2022-05-26 DIAGNOSIS — D242 Benign neoplasm of left breast: Secondary | ICD-10-CM | POA: Diagnosis not present

## 2022-06-26 HISTORY — PX: REDUCTION MAMMAPLASTY: SUR839

## 2022-08-25 ENCOUNTER — Encounter: Payer: Self-pay | Admitting: Family Medicine

## 2022-08-25 ENCOUNTER — Other Ambulatory Visit: Payer: Self-pay | Admitting: Family Medicine

## 2022-08-25 ENCOUNTER — Ambulatory Visit (INDEPENDENT_AMBULATORY_CARE_PROVIDER_SITE_OTHER): Payer: Federal, State, Local not specified - PPO | Admitting: Family Medicine

## 2022-08-25 VITALS — BP 118/77 | HR 72 | Temp 98.2°F | Ht 65.55 in | Wt 156.4 lb

## 2022-08-25 DIAGNOSIS — E782 Mixed hyperlipidemia: Secondary | ICD-10-CM

## 2022-08-25 DIAGNOSIS — R7303 Prediabetes: Secondary | ICD-10-CM | POA: Diagnosis not present

## 2022-08-25 DIAGNOSIS — F5101 Primary insomnia: Secondary | ICD-10-CM

## 2022-08-25 DIAGNOSIS — Z23 Encounter for immunization: Secondary | ICD-10-CM | POA: Diagnosis not present

## 2022-08-25 DIAGNOSIS — F418 Other specified anxiety disorders: Secondary | ICD-10-CM

## 2022-08-25 DIAGNOSIS — Z0000 Encounter for general adult medical examination without abnormal findings: Secondary | ICD-10-CM

## 2022-08-25 DIAGNOSIS — D126 Benign neoplasm of colon, unspecified: Secondary | ICD-10-CM

## 2022-08-25 DIAGNOSIS — G43001 Migraine without aura, not intractable, with status migrainosus: Secondary | ICD-10-CM

## 2022-08-25 DIAGNOSIS — Z1211 Encounter for screening for malignant neoplasm of colon: Secondary | ICD-10-CM

## 2022-08-25 DIAGNOSIS — Z1231 Encounter for screening mammogram for malignant neoplasm of breast: Secondary | ICD-10-CM

## 2022-08-25 LAB — LIPID PANEL
Cholesterol: 186 mg/dL (ref 0–200)
HDL: 64.4 mg/dL (ref >39.00)
LDL Cholesterol: 112 mg/dL — ABNORMAL HIGH (ref 0–99)
NonHDL: 121.65
Total CHOL/HDL Ratio: 3
Triglycerides: 48 mg/dL (ref 0.0–149.0)
VLDL: 9.6 mg/dL (ref 0.0–40.0)

## 2022-08-25 LAB — CBC WITH DIFFERENTIAL/PLATELET
Basophils Absolute: 0 10*3/uL (ref 0.0–0.1)
Basophils Relative: 0.6 % (ref 0.0–3.0)
Eosinophils Absolute: 0.1 10*3/uL (ref 0.0–0.7)
Eosinophils Relative: 1.3 % (ref 0.0–5.0)
HCT: 37.6 % (ref 36.0–46.0)
Hemoglobin: 12.3 g/dL (ref 12.0–15.0)
Lymphocytes Relative: 41.5 % (ref 12.0–46.0)
Lymphs Abs: 2 10*3/uL (ref 0.7–4.0)
MCHC: 32.8 g/dL (ref 30.0–36.0)
MCV: 91.1 fl (ref 78.0–100.0)
Monocytes Absolute: 0.2 10*3/uL (ref 0.1–1.0)
Monocytes Relative: 4.7 % (ref 3.0–12.0)
Neutro Abs: 2.5 10*3/uL (ref 1.4–7.7)
Neutrophils Relative %: 51.9 % (ref 43.0–77.0)
Platelets: 239 10*3/uL (ref 150.0–400.0)
RBC: 4.13 Mil/uL (ref 3.87–5.11)
RDW: 14.5 % (ref 11.5–15.5)
WBC: 4.8 10*3/uL (ref 4.0–10.5)

## 2022-08-25 LAB — COMPREHENSIVE METABOLIC PANEL
ALT: 22 U/L (ref 0–35)
AST: 15 U/L (ref 0–37)
Albumin: 4.2 g/dL (ref 3.5–5.2)
Alkaline Phosphatase: 42 U/L (ref 39–117)
BUN: 10 mg/dL (ref 6–23)
CO2: 30 mEq/L (ref 19–32)
Calcium: 9.6 mg/dL (ref 8.4–10.5)
Chloride: 102 mEq/L (ref 96–112)
Creatinine, Ser: 0.7 mg/dL (ref 0.40–1.20)
GFR: 96.76 mL/min (ref >60.00)
Glucose, Bld: 100 mg/dL — ABNORMAL HIGH (ref 70–99)
Potassium: 4.7 mEq/L (ref 3.5–5.1)
Sodium: 138 mEq/L (ref 135–145)
Total Bilirubin: 0.3 mg/dL (ref 0.2–1.2)
Total Protein: 6.8 g/dL (ref 6.0–8.3)

## 2022-08-25 LAB — HEMOGLOBIN A1C: Hgb A1c MFr Bld: 5.6 % (ref 4.6–6.5)

## 2022-08-25 LAB — TSH: TSH: 0.77 u[IU]/mL (ref 0.35–5.50)

## 2022-08-25 MED ORDER — ONDANSETRON 4 MG PO TBDP: 4.0000 mg | ORAL_TABLET | Freq: Three times a day (TID) | ORAL | 2 refills | Status: DC | PRN

## 2022-08-25 MED ORDER — CLONAZEPAM 0.5 MG PO TABS: ORAL_TABLET | ORAL | 5 refills | Status: AC

## 2022-08-25 MED ORDER — OMEPRAZOLE 40 MG PO CPDR: 40.0000 mg | DELAYED_RELEASE_CAPSULE | Freq: Every day | ORAL | 1 refills | Status: DC

## 2022-08-25 MED ORDER — BUPROPION HCL ER (XL) 150 MG PO TB24: 150.0000 mg | ORAL_TABLET | Freq: Every day | ORAL | 1 refills | Status: DC

## 2022-08-25 MED ORDER — ESCITALOPRAM OXALATE 20 MG PO TABS: 20.0000 mg | ORAL_TABLET | Freq: Every day | ORAL | 1 refills | Status: DC

## 2022-08-25 NOTE — Progress Notes (Signed)
Patient ID: Lindsay Hamilton, female  DOB: April 21, 1966, 57 y.o.   MRN: [id number] Patient Care Team    Relationship Specialty Notifications Start End  Natalia Leatherwood, DO PCP - General Family Medicine  04/15/19   Atha Starks, MD Referring Physician Psychiatry  09/14/13   Ellamae Sia, DO Consulting Physician Allergy  03/17/19   Vida Rigger, MD Consulting Physician Gastroenterology  04/15/19   Dannielle Huh, MD Consulting Physician Orthopedic Surgery  04/15/19   Guy Sandifer, [place]  Orthopedic Surgery  04/15/19   Lovenia Shuck, MD Referring Physician Ophthalmology  04/15/19   Lewayne Bunting, MD Consulting Physician Cardiology  04/15/19   York Spaniel, MD (Inactive) Consulting Physician Neurology  04/15/19   Richardean Chimera, MD Consulting Physician Obstetrics and Gynecology  04/15/19   Davina Poke  Optometry  04/15/19   Kathi Simpers, DMD  Orthodontics  04/15/19     Chief Complaint  Patient presents with   Annual Exam    [facility]; pt is fasting    Subjective:  Lindsay Hamilton is a 57 y.o.  Female  present for CPE and Chronic Conditions/illness Management  All past medical history, surgical history, allergies, family history, immunizations, medications and social history were updated in the electronic medical record today. All recent labs, ED visits and hospitalizations within the last year were reviewed.  Health maintenance:  Colonoscopy: completed 09/2019, by Dr. Ewing Schlein, follow up 5 years. Mammogram: completed: 05/12/2022-[facility]- [place] Cervical cancer screening: Hysterectomy Immunizations: tdap UTD 2014, Influenza UTD 2023 (encouraged yearly), Shingrix series completed, Infectious disease screening: HIV completed, Hep C completed DEXA: Routine screening Patient has a Dental home. Hospitalizations/ED visits: reviewed   Insomnia, unspecified type/Depression with anxiety Patient states med regimen is working still working very well for her;  She reports  compliance with Wellbutrin 150 mg daily and Lexapro 20 mg daily.  Compliance with klonopin 0.5 mg 1/2 tab in the day and 1-2 tabs qhs.  Her daughter is getting married this May 21st. Prior note: Patient reports overall she is doing well since the changes in medication.  She is sleeping well with using the Klonopin 0.5 mg nightly.  She has not used the [place] and feels she is getting good sleep with the Klonopin.  She does endorse having some increase in her emotional state, feeling more tearful at times since stopping the Lunesta and the Xanax.  She has been taking the Xanax twice daily.  However she has only been taking the Klonopin at night.  It was written to allow her to have 1 Klonopin before bed and a half a tab during the day.  She states she has not been taking the Klonopin during the day.       08/25/2022    8:10 AM 02/04/2022    8:24 AM 08/19/2021    2:37 PM 03/04/2021    2:08 PM 01/28/2021    9:19 AM  Depression screen PHQ 2/9  Decreased Interest 2 0 2 0 0  Down, Depressed, Hopeless 1 1 2  0 1  PHQ - 2 Score 3 1 4  0 1  Altered sleeping 1 1 2  1   Tired, decreased energy 1 1 1  1   Change in appetite 0 0 0  0  Feeling bad or failure about yourself  0 1 0  0  Trouble concentrating 1 1 3   0  Moving slowly or fidgety/restless 0 1 1  1   Suicidal thoughts 0 0  0  0  PHQ-9 Score 08/25/2022    8:10 AM 02/04/2022    8:24 AM 08/19/2021    2:37 PM 01/28/2021    9:20 AM  GAD 7 : Generalized Anxiety Score  Nervous, Anxious, on Edge Control/stop worrying 0  Worry too much - different things Trouble relaxing Restless Easily annoyed or irritable Afraid - awful might happen 0 0 0 0  Total GAD 7 Score Anxiety Difficulty Somewhat difficult       Immunization History  Administered Date(s) Administered   Influenza Split 04/08/2013, 02/17/2015, 02/16/2019   Influenza,inj,Quad PF,6+ Mos 01/21/2021, 02/04/2022    Influenza-Unspecified 02/16/2019, 02/03/2020, 02/14/2020   PFIZER(Purple Top)SARS-COV-2 Vaccination 05/17/2019, 06/07/2019, 01/27/2020, 01/21/2021   Pfizer Covid-19 Vaccine Bivalent Booster 81yrs & up 01/21/2021   Tdap 04/08/2013, 08/25/2022   Zoster Recombinat (Shingrix) 07/05/2019, 12/23/2019   Zoster, Live 07/18/2019     Past Medical History:  Diagnosis Date   Alcohol abuse, in remission    Depression    on meds   GERD (gastroesophageal reflux disease)    on meds   Head injury    Headaches due to old head injury    Hypokalemia 11/13/2021   Hyponatremia 11/12/2021   Ingrowing nail 02/20/2020   Insomnia 09/14/2013   Migraine without aura, without mention of intractable migraine without mention of status migrainosus 09/14/2013   Obese    Pancreatitis    Pseudoseizures    Pyelonephritis 11/26/2021   Right upper quadrant pain 06/26/2020   Severe sepsis 11/12/2021   Tubular adenoma of colon 2018   No Known Allergies Past Surgical History:  Procedure Laterality Date   ABDOMINAL HYSTERECTOMY  2009   Partial   CARPAL TUNNEL RELEASE  1998   CESAREAN SECTION     x2-1992, 1997   CHOLECYSTECTOMY  2002   COLONOSCOPY     COLONOSCOPY W/ BIOPSIES  05/2016   tubular adenoma   GREAT TOE ARTHRODESIS, METATARSALPHALANGEAL JOINT Right 07/17/2018   HAND ARTHROPLASTY Left 2022   [facility] arthorplasty   KNEE ARTHROSCOPY     bilateral, 3 right and 3 left   SHOULDER ARTHROSCOPY Bilateral    Right 2012, left 2014   UPPER GASTROINTESTINAL ENDOSCOPY  2022   DJ-MAC-dysphagia/heartburn-dilated to 18mm-   Family History  Problem Relation Age of Onset   Hypertension Mother    CAD Mother        Died of MI at age 82   Asthma Mother    Pulmonary embolism Mother        x2   Cancer - Prostate Father    Atrial fibrillation Father    Hyperlipidemia Father    Seizures Sister    Migraines Sister    Hypertension Brother    Hypertension Brother    Lung cancer Paternal Grandmother    Breast  cancer Neg Hx    Allergic rhinitis Neg Hx    Eczema Neg Hx    Urticaria Neg Hx    Colon polyps Neg Hx    Colon cancer Neg Hx    Esophageal cancer Neg Hx    Stomach cancer Neg Hx    Rectal cancer Neg Hx    Social History   Social History Narrative   Marital status/children/pets: Married.  2 children.  Education/employment: High school graduate with some college.  Works as a Forensic scientist.   Safety:      -smoke alarm in the home:Yes     - wears seatbelt: Yes     - Feels safe in their relationships: Yes    Allergies as of 08/25/2022   No Known Allergies      Medication List        Accurate as of August 25, 2022  9:38 AM. If you have any questions, ask your nurse or doctor.          buPROPion 150 MG 24 hr tablet Commonly known as: WELLBUTRIN XL Take 1 tablet (150 mg total) by mouth daily.   clonazePAM 0.5 MG tablet Commonly known as: KLONOPIN 1/2 tab during the day and 1-2 tabs QHS and   escitalopram 20 MG tablet Commonly known as: LEXAPRO Take 1 tablet (20 mg total) by mouth at bedtime.   gabapentin 100 MG capsule Commonly known as: NEURONTIN Take 100 mg by mouth every evening.   Linzess 72 MCG capsule Generic drug: linaclotide TAKE 1 CAPSULE BY MOUTH DAILY BEFORE BREAKFAST.   omeprazole 40 MG capsule Commonly known as: PRILOSEC Take 1 capsule (40 mg total) by mouth daily.   ondansetron 4 MG disintegrating tablet Commonly known as: ZOFRAN-ODT Take 1 tablet (4 mg total) by mouth every 8 (eight) hours as needed for nausea or vomiting. What changed: additional instructions Changed by: Felix Pacini, DO        All past medical history, surgical history, allergies, family history, immunizations andmedications were updated in the EMR today and reviewed under the history and medication portions of their EMR.     No results found for this or any previous visit (from the past 2160 hour(s)).    ROS 14 pt review of systems performed and  negative (unless mentioned in an HPI)  Objective: BP 118/77   Pulse 72   Temp 98.2 F (36.8 C)   Ht 5' 5.55" (1.665 m)   Wt 156 lb 6.4 oz (70.9 kg)   SpO2 98%   BMI 25.59 kg/m  Physical Exam Vitals and nursing note reviewed.  Constitutional:      General: She is not in acute distress.    Appearance: Normal appearance. She is not ill-appearing or toxic-appearing.  HENT:     Head: Normocephalic and atraumatic.     Right Ear: Tympanic membrane, ear canal and external ear normal. There is no impacted cerumen.     Left Ear: Tympanic membrane, ear canal and external ear normal. There is no impacted cerumen.     Nose: No congestion or rhinorrhea.     Mouth/Throat:     Mouth: Mucous membranes are moist.     Pharynx: Oropharynx is clear. No oropharyngeal exudate or posterior oropharyngeal erythema.  Eyes:     General: No scleral icterus.       Right eye: No discharge.        Left eye: No discharge.     Extraocular Movements: Extraocular movements intact.     Conjunctiva/sclera: Conjunctivae normal.     Pupils: Pupils are equal, round, and reactive to light.  Cardiovascular:     Rate and Rhythm: Normal rate and regular rhythm.     Pulses: Normal pulses.     Heart sounds: Normal heart sounds. No murmur heard.    No friction rub. No gallop.  Pulmonary:     Effort: Pulmonary effort is normal. No respiratory distress.  Breath sounds: Normal breath sounds. No stridor. No wheezing, rhonchi or rales.  Chest:     Chest wall: No tenderness.  Abdominal:     General: Abdomen is flat. Bowel sounds are normal. There is no distension.     Palpations: Abdomen is soft. There is no mass.     Tenderness: There is no abdominal tenderness. There is no right CVA tenderness, left CVA tenderness, guarding or rebound.     Hernia: No hernia is present.  Musculoskeletal:        General: No swelling, tenderness or deformity. Normal range of motion.     Cervical back: Normal range of motion and neck  supple. No rigidity or tenderness.     Right lower leg: No edema.     Left lower leg: No edema.  Lymphadenopathy:     Cervical: No cervical adenopathy.  Skin:    General: Skin is warm and dry.     Coloration: Skin is not jaundiced or pale.     Findings: No bruising, erythema, lesion or rash.  Neurological:     General: No focal deficit present.     Mental Status: She is alert and oriented to person, place, and time. Mental status is at baseline.     Cranial Nerves: No cranial nerve deficit.     Sensory: No sensory deficit.     Motor: No weakness.     Coordination: Coordination normal.     Gait: Gait normal.     Deep Tendon Reflexes: Reflexes normal.  Psychiatric:        Mood and Affect: Mood normal.        Behavior: Behavior normal.        Thought Content: Thought content normal.        Judgment: Judgment normal.      No results found.  Assessment/plan: Lindsay Hamilton is a 57 y.o. female present for CPE and routine chronic condition Insomnia, unspecified type/Depression with anxiety Stable Continue Wellbutrin 150 mg daily.   Continue Lexapro 20 mg daily. Continue Klonopin with mild increase in night dose 0.5-1 mg nightly and use half a tab 12 hours later if needed only.  Tried meds: Alfonso Patten and Xanax [place] controlled substance database reviewed make 08/25/22 controlled substance signed uds UTD Patient counseled on addictive properties of benzodiazepines. Follow-up 5-6 months, sooner if needed   Prediabetes A 1C collected today   Tubular adenoma of colon Colonoscopy up-to-date, repeat 2026   Mixed hyperlipidemia/obesity Diet and exercise recommended.  Diet and exercise Lipids collected today  Encounter for preventive health examination Patient was encouraged to exercise greater than 150 minutes a week. Patient was encouraged to choose a diet filled with fresh fruits and vegetables, and lean meats. AVS provided to patient today for  education/recommendation on gender specific health and safety maintenance. Colonoscopy: completed 09/2019, by Dr. Ewing Schlein, follow up 5 years. Mammogram: completed: 05/12/2022-[facility]- [place] Cervical cancer screening: Hysterectomy Immunizations: tdap Updated 2024-today, Influenza UTD 2023 (encouraged yearly), Shingrix series completed, Infectious disease screening: HIV completed, Hep C completed DEXA: Routine screening  Return in about 24 weeks (around 02/09/2023) for Routine chronic condition follow-up.   Orders Placed This Encounter  Procedures   Tdap vaccine greater than or equal to 7yo IM   CBC with Differential/Platelet   Hemoglobin A1c   Comprehensive metabolic panel   Lipid panel   TSH   Meds ordered this encounter  Medications   buPROPion (WELLBUTRIN XL) 150 MG 24 hr tablet    Sig:  Take 1 tablet (150 mg total) by mouth daily.    Dispense:  90 tablet    Refill:  1   clonazePAM (KLONOPIN) 0.5 MG tablet    Sig: 1/2 tab during the day and 1-2 tabs QHS and    Dispense:  75 tablet    Refill:  5   escitalopram (LEXAPRO) 20 MG tablet    Sig: Take 1 tablet (20 mg total) by mouth at bedtime.    Dispense:  90 tablet    Refill:  1   omeprazole (PRILOSEC) 40 MG capsule    Sig: Take 1 capsule (40 mg total) by mouth daily.    Dispense:  90 capsule    Refill:  1   DISCONTD: ondansetron (ZOFRAN-ODT) 4 MG disintegrating tablet    Sig: Take 1 tablet (4 mg total) by mouth every 8 (eight) hours as needed for nausea or vomiting.  ODT q4 hours prn nausea/vomit    Dispense:  30 tablet    Refill:  2   Referral Orders  No referral(s) requested today     Electronically signed by: Felix Pacini, DO [facility] [place]

## 2022-08-25 NOTE — Patient Instructions (Addendum)
Return in about 24 weeks (around 02/09/2023) for Routine chronic condition follow-up.        Great to see you today.  I have refilled the medication(s) we provide.   If labs were collected, we will inform you of lab results once received either by echart message or telephone call.   - echart message- for normal results that have been seen by the patient already.   - telephone call: abnormal results or if patient has not viewed results in their echart.

## 2022-10-07 DIAGNOSIS — B349 Viral infection, unspecified: Secondary | ICD-10-CM | POA: Diagnosis not present

## 2022-10-07 DIAGNOSIS — R112 Nausea with vomiting, unspecified: Secondary | ICD-10-CM | POA: Diagnosis not present

## 2022-10-07 DIAGNOSIS — R197 Diarrhea, unspecified: Secondary | ICD-10-CM | POA: Diagnosis not present

## 2022-11-18 DIAGNOSIS — R748 Abnormal levels of other serum enzymes: Secondary | ICD-10-CM | POA: Diagnosis not present

## 2022-11-18 DIAGNOSIS — Z0000 Encounter for general adult medical examination without abnormal findings: Secondary | ICD-10-CM | POA: Diagnosis not present

## 2022-11-18 DIAGNOSIS — F332 Major depressive disorder, recurrent severe without psychotic features: Secondary | ICD-10-CM | POA: Diagnosis not present

## 2022-11-18 DIAGNOSIS — Z1330 Encounter for screening examination for mental health and behavioral disorders, unspecified: Secondary | ICD-10-CM | POA: Diagnosis not present

## 2022-11-18 DIAGNOSIS — R110 Nausea: Secondary | ICD-10-CM | POA: Diagnosis not present

## 2023-02-09 ENCOUNTER — Ambulatory Visit (INDEPENDENT_AMBULATORY_CARE_PROVIDER_SITE_OTHER): Payer: Federal, State, Local not specified - PPO | Admitting: Family Medicine

## 2023-02-09 ENCOUNTER — Encounter: Payer: Self-pay | Admitting: Family Medicine

## 2023-02-09 DIAGNOSIS — Z91199 Patient's noncompliance with other medical treatment and regimen due to unspecified reason: Secondary | ICD-10-CM

## 2023-02-09 NOTE — Progress Notes (Signed)
No show

## 2023-02-09 NOTE — Patient Instructions (Signed)

## 2023-02-10 ENCOUNTER — Telehealth: Payer: Self-pay | Admitting: Family Medicine

## 2023-02-10 NOTE — Telephone Encounter (Signed)
Patient called to report she received a no show letter for her 10/7 appointment, however she mentions that she replied no on the auto text. I do see that she did hit No back on 9/20 and then again on 10/4. The patient is asking to have the charge dismissed and to please get in contact with her regarding this matter. She can also provide proof from her end with text messages.

## 2023-02-11 NOTE — Telephone Encounter (Signed)
Request sent to manager

## 2023-02-27 NOTE — Telephone Encounter (Signed)
Per further investigation. The "no" response given on 02/06/2023 was in reference to appointment scheduled for 02/09/2023. Therefore there should be no charge for patient. The appointment on 10/7 was canceled on 10/4. Fee voided.

## 2023-03-26 ENCOUNTER — Other Ambulatory Visit: Payer: Self-pay

## 2023-03-26 MED ORDER — BUPROPION HCL ER (XL) 150 MG PO TB24: 150.0000 mg | ORAL_TABLET | Freq: Every day | ORAL | 0 refills | Status: AC

## 2023-03-26 MED ORDER — OMEPRAZOLE 40 MG PO CPDR: 40.0000 mg | DELAYED_RELEASE_CAPSULE | Freq: Every day | ORAL | 0 refills | Status: AC

## 2023-03-26 MED ORDER — ESCITALOPRAM OXALATE 20 MG PO TABS: 20.0000 mg | ORAL_TABLET | Freq: Every day | ORAL | 0 refills | Status: AC

## 2023-04-23 DIAGNOSIS — B9689 Other specified bacterial agents as the cause of diseases classified elsewhere: Secondary | ICD-10-CM | POA: Diagnosis not present

## 2023-04-23 DIAGNOSIS — J208 Acute bronchitis due to other specified organisms: Secondary | ICD-10-CM | POA: Diagnosis not present

## 2023-04-23 DIAGNOSIS — R051 Acute cough: Secondary | ICD-10-CM | POA: Diagnosis not present

## 2023-06-15 ENCOUNTER — Other Ambulatory Visit: Payer: Self-pay | Admitting: Family Medicine

## 2023-06-15 DIAGNOSIS — Z1231 Encounter for screening mammogram for malignant neoplasm of breast: Secondary | ICD-10-CM

## 2023-06-17 ENCOUNTER — Ambulatory Visit
Admission: RE | Admit: 2023-06-17 | Discharge: 2023-06-17 | Disposition: A | Discharge status: Home / Self Care | Payer: Federal, State, Local not specified - PPO | Source: Ambulatory Visit | Attending: Family Medicine | Admitting: Family Medicine

## 2023-06-17 DIAGNOSIS — Z1231 Encounter for screening mammogram for malignant neoplasm of breast: Secondary | ICD-10-CM

## 2023-06-25 ENCOUNTER — Other Ambulatory Visit: Payer: Self-pay | Admitting: Family Medicine

## 2023-06-26 ENCOUNTER — Other Ambulatory Visit: Payer: Self-pay | Admitting: Family Medicine

## 2023-06-26 DIAGNOSIS — N6320 Unspecified lump in the left breast, unspecified quadrant: Secondary | ICD-10-CM

## 2023-07-28 ENCOUNTER — Other Ambulatory Visit: Payer: Federal, State, Local not specified - PPO

## 2023-09-01 ENCOUNTER — Ambulatory Visit
Admission: RE | Admit: 2023-09-01 | Discharge: 2023-09-01 | Disposition: A | Discharge status: Home / Self Care | Source: Ambulatory Visit | Attending: Family Medicine | Admitting: Family Medicine

## 2023-09-01 DIAGNOSIS — N6320 Unspecified lump in the left breast, unspecified quadrant: Secondary | ICD-10-CM

## 2023-10-22 ENCOUNTER — Ambulatory Visit: Admitting: Gastroenterology

## 2024-01-21 ENCOUNTER — Other Ambulatory Visit: Payer: Self-pay | Admitting: Orthopedic Surgery

## 2024-01-28 ENCOUNTER — Other Ambulatory Visit: Payer: Self-pay

## 2024-01-28 ENCOUNTER — Encounter (HOSPITAL_BASED_OUTPATIENT_CLINIC_OR_DEPARTMENT_OTHER): Payer: Self-pay | Admitting: Orthopedic Surgery

## 2024-02-01 NOTE — Progress Notes (Signed)

## 2024-02-04 ENCOUNTER — Encounter (HOSPITAL_BASED_OUTPATIENT_CLINIC_OR_DEPARTMENT_OTHER): Payer: Self-pay | Admitting: Orthopedic Surgery

## 2024-02-04 ENCOUNTER — Ambulatory Visit (HOSPITAL_BASED_OUTPATIENT_CLINIC_OR_DEPARTMENT_OTHER)

## 2024-02-04 ENCOUNTER — Ambulatory Visit (HOSPITAL_BASED_OUTPATIENT_CLINIC_OR_DEPARTMENT_OTHER): Admitting: Anesthesiology

## 2024-02-04 ENCOUNTER — Ambulatory Visit (HOSPITAL_BASED_OUTPATIENT_CLINIC_OR_DEPARTMENT_OTHER)
Admission: RE | Admit: 2024-02-04 | Discharge: 2024-02-04 | Disposition: A | Discharge status: Home / Self Care | Attending: Orthopedic Surgery | Admitting: Orthopedic Surgery

## 2024-02-04 ENCOUNTER — Other Ambulatory Visit: Payer: Self-pay

## 2024-02-04 ENCOUNTER — Encounter (HOSPITAL_BASED_OUTPATIENT_CLINIC_OR_DEPARTMENT_OTHER)
Admission: RE | Disposition: A | Discharge status: Home / Self Care | Payer: Self-pay | Source: Home / Self Care | Attending: Orthopedic Surgery

## 2024-02-04 DIAGNOSIS — F32A Depression, unspecified: Secondary | ICD-10-CM | POA: Diagnosis not present

## 2024-02-04 DIAGNOSIS — K219 Gastro-esophageal reflux disease without esophagitis: Secondary | ICD-10-CM | POA: Diagnosis not present

## 2024-02-04 DIAGNOSIS — M2022 Hallux rigidus, left foot: Secondary | ICD-10-CM | POA: Insufficient documentation

## 2024-02-04 DIAGNOSIS — Z87891 Personal history of nicotine dependence: Secondary | ICD-10-CM | POA: Diagnosis not present

## 2024-02-04 DIAGNOSIS — F419 Anxiety disorder, unspecified: Secondary | ICD-10-CM | POA: Diagnosis not present

## 2024-02-04 HISTORY — PX: CHEILECTOMY: SHX1336

## 2024-02-04 SURGERY — CHEILECTOMY
Anesthesia: General | Site: Foot | Laterality: Left

## 2024-02-04 MED ORDER — PROPOFOL 500 MG/50ML IV EMUL
INTRAVENOUS | Status: DC | PRN
  Administered 2024-02-04: 100 ug/kg/min via INTRAVENOUS

## 2024-02-04 MED ORDER — MIDAZOLAM HCL 5 MG/5ML IJ SOLN
INTRAMUSCULAR | Status: DC | PRN
  Administered 2024-02-04: 2 mg via INTRAVENOUS

## 2024-02-04 MED ORDER — LACTATED RINGERS IV SOLN
INTRAVENOUS | Status: DC | PRN
Start: 2024-02-04 — End: 2024-02-04

## 2024-02-04 MED ORDER — HYDROCODONE-ACETAMINOPHEN 10-325 MG PO TABS: 1.0000 | ORAL_TABLET | Freq: Four times a day (QID) | ORAL | 0 refills | Status: AC | PRN

## 2024-02-04 MED ORDER — CEFAZOLIN SODIUM-DEXTROSE 2-4 GM/100ML-% IV SOLN
2.0000 g | INTRAVENOUS | Status: AC
  Administered 2024-02-04: 2 g via INTRAVENOUS

## 2024-02-04 MED ORDER — OXYCODONE HCL 5 MG PO TABS: 5.0000 mg | ORAL_TABLET | Freq: Once | ORAL | Status: DC | PRN

## 2024-02-04 MED ORDER — ACETAMINOPHEN 10 MG/ML IV SOLN
INTRAVENOUS | Status: DC | PRN
Start: 2024-02-04 — End: 2024-02-04
  Administered 2024-02-04: 1000 mg via INTRAVENOUS

## 2024-02-04 MED ORDER — CEFAZOLIN SODIUM-DEXTROSE 2-4 GM/100ML-% IV SOLN
INTRAVENOUS | Status: AC
  Filled 2024-02-04: qty 100

## 2024-02-04 MED ORDER — SODIUM CHLORIDE 0.9 % IV SOLN
INTRAVENOUS | Status: AC | PRN
  Administered 2024-02-04: 250 mL

## 2024-02-04 MED ORDER — LACTATED RINGERS IV SOLN
INTRAVENOUS | Status: DC
Start: 2024-02-04 — End: 2024-02-04

## 2024-02-04 MED ORDER — MEPERIDINE HCL 25 MG/ML IJ SOLN: 6.2500 mg | INTRAMUSCULAR | Status: DC | PRN

## 2024-02-04 MED ORDER — SUCCINYLCHOLINE CHLORIDE 200 MG/10ML IV SOSY
PREFILLED_SYRINGE | INTRAVENOUS | Status: DC | PRN
Start: 2024-02-04 — End: 2024-02-04
  Administered 2024-02-04: 60 mg via INTRAVENOUS

## 2024-02-04 MED ORDER — PROPOFOL 10 MG/ML IV BOLUS
INTRAVENOUS | Status: DC | PRN
  Administered 2024-02-04: 100 mg via INTRAVENOUS
  Administered 2024-02-04: 200 mg via INTRAVENOUS

## 2024-02-04 MED ORDER — DEXAMETHASONE SODIUM PHOSPHATE 10 MG/ML IJ SOLN
INTRAMUSCULAR | Status: AC
  Filled 2024-02-04: qty 1

## 2024-02-04 MED ORDER — BUPIVACAINE-EPINEPHRINE 0.5% -1:200000 IJ SOLN
INTRAMUSCULAR | Status: DC | PRN
  Administered 2024-02-04: 14 mL

## 2024-02-04 MED ORDER — FENTANYL CITRATE (PF) 100 MCG/2ML IJ SOLN
INTRAMUSCULAR | Status: DC | PRN
  Administered 2024-02-04 (×2): 50 ug via INTRAVENOUS

## 2024-02-04 MED ORDER — OXYCODONE HCL 5 MG/5ML PO SOLN: 5.0000 mg | Freq: Once | ORAL | Status: DC | PRN

## 2024-02-04 MED ORDER — KETOROLAC TROMETHAMINE 30 MG/ML IJ SOLN
INTRAMUSCULAR | Status: DC | PRN
  Administered 2024-02-04: 30 mg via INTRAVENOUS

## 2024-02-04 MED ORDER — 0.9 % SODIUM CHLORIDE (POUR BTL) OPTIME
TOPICAL | Status: DC | PRN
  Administered 2024-02-04: 120 mL

## 2024-02-04 MED ORDER — GLYCOPYRROLATE 0.2 MG/ML IJ SOLN
INTRAMUSCULAR | Status: DC | PRN
  Administered 2024-02-04: .2 mg via INTRAVENOUS

## 2024-02-04 MED ORDER — FENTANYL CITRATE (PF) 100 MCG/2ML IJ SOLN
INTRAMUSCULAR | Status: AC
  Filled 2024-02-04: qty 2

## 2024-02-04 MED ORDER — VANCOMYCIN HCL 500 MG IV SOLR
INTRAVENOUS | Status: AC
  Filled 2024-02-04: qty 10

## 2024-02-04 MED ORDER — SODIUM CHLORIDE 0.9 % IV SOLN: 12.5000 mg | INTRAVENOUS | Status: DC | PRN

## 2024-02-04 MED ORDER — DEXAMETHASONE SODIUM PHOSPHATE 10 MG/ML IJ SOLN
INTRAMUSCULAR | Status: DC | PRN
  Administered 2024-02-04: 10 mg via INTRAVENOUS

## 2024-02-04 MED ORDER — AMISULPRIDE (ANTIEMETIC) 5 MG/2ML IV SOLN: 10.0000 mg | Freq: Once | INTRAVENOUS | Status: DC | PRN

## 2024-02-04 MED ORDER — HYDROMORPHONE HCL 1 MG/ML IJ SOLN: 0.2500 mg | INTRAMUSCULAR | Status: DC | PRN

## 2024-02-04 MED ORDER — ONDANSETRON HCL 4 MG/2ML IJ SOLN
INTRAMUSCULAR | Status: AC
  Filled 2024-02-04: qty 2

## 2024-02-04 MED ORDER — ONDANSETRON HCL 4 MG/2ML IJ SOLN
INTRAMUSCULAR | Status: DC | PRN
  Administered 2024-02-04: 4 mg via INTRAVENOUS

## 2024-02-04 MED ORDER — DEXMEDETOMIDINE HCL IN NACL 80 MCG/20ML IV SOLN
INTRAVENOUS | Status: DC | PRN
  Administered 2024-02-04: 4 ug via INTRAVENOUS
  Administered 2024-02-04: 8 ug via INTRAVENOUS

## 2024-02-04 MED ORDER — MIDAZOLAM HCL 2 MG/2ML IJ SOLN
INTRAMUSCULAR | Status: AC
  Filled 2024-02-04: qty 2

## 2024-02-04 MED ORDER — LIDOCAINE 2% (20 MG/ML) 5 ML SYRINGE
INTRAMUSCULAR | Status: DC | PRN
  Administered 2024-02-04: 40 mg via INTRAVENOUS

## 2024-02-04 SURGICAL SUPPLY — 53 items
BLADE AVERAGE 25X9 (BLADE) IMPLANT
BLADE MINI RND TIP GREEN BEAV (BLADE) ×2 IMPLANT
BLADE OSC/SAG .038X5.5 CUT EDG (BLADE) IMPLANT
BLADE SURG 15 STRL LF DISP TIS (BLADE) ×4 IMPLANT
BNDG COMPR ESMARK 4X3 LF (GAUZE/BANDAGES/DRESSINGS) IMPLANT
BNDG COMPR ESMARK 6X3 LF (GAUZE/BANDAGES/DRESSINGS) IMPLANT
BNDG ELASTIC 4INX 5YD STR LF (GAUZE/BANDAGES/DRESSINGS) ×2 IMPLANT
BNDG STRETCH GAUZE 3IN X12FT (GAUZE/BANDAGES/DRESSINGS) ×2 IMPLANT
BUR MIS STRT WEDGE 2.9X13 (BURR) IMPLANT
CHLORAPREP W/TINT 26 (MISCELLANEOUS) ×2 IMPLANT
COVER BACK TABLE 60X90IN (DRAPES) ×2 IMPLANT
CUFF TOURN SGL QUICK 18X4 (TOURNIQUET CUFF) IMPLANT
CUFF TRNQT CYL 34X4.125X (TOURNIQUET CUFF) IMPLANT
DRAPE EXTREMITY T 121X128X90 (DISPOSABLE) ×2 IMPLANT
DRAPE OEC MINIVIEW 54X84 (DRAPES) ×2 IMPLANT
DRAPE SURG 17X23 STRL (DRAPES) IMPLANT
DRAPE U-SHAPE 47X51 STRL (DRAPES) ×2 IMPLANT
DRSG MEPITEL 4X7.2 (GAUZE/BANDAGES/DRESSINGS) ×2 IMPLANT
ELECTRODE REM PT RTRN 9FT ADLT (ELECTROSURGICAL) ×2 IMPLANT
GAUZE PAD ABD 8X10 STRL (GAUZE/BANDAGES/DRESSINGS) ×2 IMPLANT
GAUZE SPONGE 4X4 12PLY STRL (GAUZE/BANDAGES/DRESSINGS) ×2 IMPLANT
GAUZE STRETCH 2X75IN STRL (MISCELLANEOUS) IMPLANT
GLOVE BIO SURGEON STRL SZ8 (GLOVE) ×2 IMPLANT
GLOVE BIOGEL PI IND STRL 7.0 (GLOVE) IMPLANT
GLOVE BIOGEL PI IND STRL 7.5 (GLOVE) IMPLANT
GLOVE BIOGEL PI IND STRL 8 (GLOVE) ×4 IMPLANT
GLOVE ECLIPSE 8.0 STRL XLNG CF (GLOVE) ×2 IMPLANT
GLOVE SURG SS PI 7.0 STRL IVOR (GLOVE) IMPLANT
GOWN STRL REUS W/ TWL LRG LVL3 (GOWN DISPOSABLE) ×2 IMPLANT
GOWN STRL REUS W/ TWL XL LVL3 (GOWN DISPOSABLE) ×4 IMPLANT
NDL HYPO 18GX1.5 BLUNT FILL (NEEDLE) IMPLANT
NDL HYPO 25X1 1.5 SAFETY (NEEDLE) IMPLANT
NEEDLE HYPO 18GX1.5 BLUNT FILL (NEEDLE) ×1 IMPLANT
NEEDLE HYPO 25X1 1.5 SAFETY (NEEDLE) IMPLANT
NS IRRIG 1000ML POUR BTL (IV SOLUTION) ×2 IMPLANT
PACK BASIN DAY SURGERY FS (CUSTOM PROCEDURE TRAY) ×2 IMPLANT
PAD CAST 4YDX4 CTTN HI CHSV (CAST SUPPLIES) ×2 IMPLANT
PENCIL SMOKE EVACUATOR (MISCELLANEOUS) ×2 IMPLANT
SANITIZER HAND ALTRA PUMP 550 (MISCELLANEOUS) ×2 IMPLANT
SET IRRIGATION TUBING (TUBING) IMPLANT
SHEET MEDIUM DRAPE 40X70 STRL (DRAPES) ×2 IMPLANT
SLEEVE SCD COMPRESS KNEE MED (STOCKING) ×2 IMPLANT
SPONGE T-LAP 18X18 ~~LOC~~+RFID (SPONGE) ×2 IMPLANT
STOCKINETTE 6 STRL (DRAPES) ×2 IMPLANT
SUCTION TUBE FRAZIER 10FR DISP (SUCTIONS) IMPLANT
SUT ETHILON 3 0 PS 1 (SUTURE) ×2 IMPLANT
SUT MNCRL AB 3-0 PS2 18 (SUTURE) IMPLANT
SUT VIC AB 2-0 SH 27XBRD (SUTURE) IMPLANT
SYR BULB EAR ULCER 3OZ GRN STR (SYRINGE) IMPLANT
SYR CONTROL 10ML LL (SYRINGE) IMPLANT
TOWEL GREEN STERILE FF (TOWEL DISPOSABLE) ×2 IMPLANT
TUBE CONNECTING 20X1/4 (TUBING) IMPLANT
UNDERPAD 30X36 HEAVY ABSORB (UNDERPADS AND DIAPERS) ×2 IMPLANT

## 2024-02-04 NOTE — Anesthesia Procedure Notes (Signed)
 Procedure Name: LMA Insertion Date/Time: 02/04/2024 1:11 PM  Performed by: Denton Niels CROME, CRNAPre-anesthesia Checklist: Patient identified, Emergency Drugs available, Suction available, Patient being monitored and Timeout performed Patient Re-evaluated:Patient Re-evaluated prior to induction Oxygen Delivery Method: Circle system utilized Preoxygenation: Pre-oxygenation with 100% oxygen Induction Type: IV induction Ventilation: Mask ventilation without difficulty LMA: LMA with gastric port inserted LMA Size: 4.0 Number of attempts: 1 Placement Confirmation: positive ETCO2 Dental Injury: Teeth and Oropharynx as per pre-operative assessment

## 2024-02-04 NOTE — Anesthesia Postprocedure Evaluation (Signed)
 Anesthesia Post Note  Patient: Rhonda Linan Tobin  Procedure(s) Performed: Minimally invasive left hallux metatarsophalangeal joint cheilectomy (Left: Foot)     Patient location during evaluation: PACU Anesthesia Type: General Level of consciousness: awake and alert Pain management: pain level controlled Vital Signs Assessment: post-procedure vital signs reviewed and stable Respiratory status: spontaneous breathing, nonlabored ventilation and respiratory function stable Cardiovascular status: blood pressure returned to baseline and stable Postop Assessment: no apparent nausea or vomiting Anesthetic complications: no   No notable events documented.  Last Vitals:  Vitals:   02/04/24 1419 02/04/24 1440  BP:  129/68  Pulse: 63 60  Resp: 10 16  Temp:  36.7 C  SpO2: 96% 97%    Last Pain:  Vitals:   02/04/24 1440  TempSrc:   PainSc: 0-No pain                 Butler Levander Pinal

## 2024-02-04 NOTE — Op Note (Signed)
 02/04/2024  1:47 PM  PATIENT:  Lindsay Hamilton  58 y.o. female  PRE-OPERATIVE DIAGNOSIS:  Hallux rigidus, left foot  POST-OPERATIVE DIAGNOSIS:  same  Procedure(s):  Minimally invasive left hallux metatarsophalangeal joint cheilectomy  SURGEON:  Norleen Armor, MD  ASSISTANT: none  ANESTHESIA:   General, local  EBL:  minimal   TOURNIQUET:  none  COMPLICATIONS:  None apparent  DISPOSITION:  Extubated, awake and stable to recovery.  INDICATION FOR PROCEDURE: 58 year old female complains of worsening left forefoot pain at the hallux MP joint.  She has signs and symptoms of hallux rigidus with a large dorsal osteophyte.  She has failed treatment with activity and shoewear modification.  She presents for surgical treatment.  The risks and benefits of the alternative treatment options have been discussed in detail.  The patient wishes to proceed with surgery and specifically understands risks of bleeding, infection, nerve damage, blood clots, need for additional surgery, amputation and death.   PROCEDURE IN DETAIL:  After pre operative consent was obtained, and the correct operative site was identified, the patient was brought to the operating room and placed supine on the OR table.  Anesthesia was administered.  Pre-operative antibiotics were administered.  A surgical timeout was taken.  The left lower extremity was prepped and draped in standard sterile fashion.  A metatarsal block was performed with half percent Marcaine with epinephrine circumferentially around the first ray.  A dorsal medial incision was made with a Beaver blade.  Periosteal elevator was then used to elevate the periosteum and dorsal joint capsule over the hallux MP joint.  A lateral radiograph was obtained identifying the dorsal osteophyte.  An AP radiograph revealed medial osteophyte.  A small conical Arthrex bur was inserted into the incision and advanced into the medial osteophyte.  Under fluoroscopic guidance the  osteophyte was morselized in its entirety.  The bone paste was expressed from the wound.  The wound was irrigated copiously.  On the lateral view the bur was advanced into the dorsal osteophyte.  The bur was then swept from medial to lateral resecting all of the hypertrophic bone in line with the first metatarsal shaft.  The bone paste was expressed from the wound.  The wound was then irrigated copiously with a 20 cc syringe and a blunt 16-gauge needle.  A lateral radiograph was again obtained confirming complete removal of the dorsal osteophyte and excellent dorsiflexion range of motion.  An AP view showed appropriate removal of the medial osteophyte.  The incision was closed with nylon.  Sterile dressings were applied followed by compression wrap.  The patient was awakened from anesthesia and transported to the recovery room in stable condition.  FOLLOW UP PLAN: Weightbearing as tolerated in a flat postop shoe.  Active range of motion.  No indication for DVT prophylaxis in this ambulatory patient.  Follow-up with me in the office in 2 weeks for suture removal and to initiate active and passive range of motion and possible physical therapy.  RADIOGRAPHS: AP and lateral radiographs of the left foot are obtained intraoperatively.  These show interval resection of the dorsal and medial osteophytes.  Appropriate dorsiflexion range of motion is noted.  No acute injuries are evident.    Justin Ollis PA-C was present and scrubbed for the duration of the operative case. His assistance was essential in positioning the patient, prepping and draping, gaining and maintaining exposure, performing the operation, closing and dressing the wounds and applying the splint.

## 2024-02-04 NOTE — Transfer of Care (Signed)
 Immediate Anesthesia Transfer of Care Note  Patient: Lindsay Hamilton  Procedure(s) Performed: Minimally invasive left hallux metatarsophalangeal joint cheilectomy (Left: Foot)  Patient Location: PACU  Anesthesia Type:General  Level of Consciousness: drowsy and responds to stimulation  Airway & Oxygen Therapy: Patient Spontanous Breathing and Patient connected to face mask oxygen  Post-op Assessment: Report given to RN and Post -op Vital signs reviewed and stable  Post vital signs: Reviewed and stable  Last Vitals:  Vitals Value Taken Time  BP 114/65 02/04/24 13:49  Temp    Pulse 76 02/04/24 13:53  Resp 14 02/04/24 13:53  SpO2 99 % 02/04/24 13:53  Vitals shown include unfiled device data.  Last Pain:  Vitals:   02/04/24 1150  TempSrc: Temporal  PainSc: 5       Patients Stated Pain Goal: 3 (02/04/24 1150)  Complications: No notable events documented.

## 2024-02-04 NOTE — H&P (Signed)
 Lindsay Hamilton is an 58 y.o. female.   Chief Complaint: left foot pain HPI: 58 year old female without significant past medical history presents today with a chief complaint of left forefoot pain.  She has a history of hallux rigidus and has failed nonoperative treatment including activity modification, oral anti-inflammatories and shoewear modification.  She presents today for surgical treatment.  Past Medical History:  Diagnosis Date   Alcohol abuse, in remission    Depression    on meds   GERD (gastroesophageal reflux disease)    on meds   Head injury    Headaches due to old head injury    Hyperlipidemia 06/26/2020   Hypokalemia 11/13/2021   Hyponatremia 11/12/2021   Ingrowing nail 02/20/2020   Insomnia 09/14/2013   Migraine without aura, without mention of intractable migraine without mention of status migrainosus 09/14/2013   Normocytic anemia 11/14/2021   Obese    Pancreatitis    Prediabetes 04/15/2019   6.2 a1c 03/10/2019     Pseudoseizures    Pyelonephritis 11/26/2021   Right upper quadrant pain 06/26/2020   Severe sepsis (HCC) 11/12/2021   Transaminitis 11/12/2021   Tubular adenoma of colon 2018    Past Surgical History:  Procedure Laterality Date   ABDOMINAL HYSTERECTOMY  2009   Partial   CARPAL TUNNEL RELEASE  1998   CESAREAN SECTION     x2-1992, 1997   CHOLECYSTECTOMY  2002   COLONOSCOPY     COLONOSCOPY W/ BIOPSIES  05/2016   tubular adenoma   GREAT TOE ARTHRODESIS, METATARSALPHALANGEAL JOINT Right 07/17/2018   HAND ARTHROPLASTY Left 2022   [facility] arthorplasty   KNEE ARTHROSCOPY     bilateral, 3 right and 3 left   REDUCTION MAMMAPLASTY Bilateral 06/26/2022   Breast Lift   SHOULDER ARTHROSCOPY Bilateral    Right 2012, left 2014   UPPER GASTROINTESTINAL ENDOSCOPY  2022   DJ-MAC-dysphagia/heartburn-dilated to 18mm-    Family History  Problem Relation Age of Onset   Hypertension Mother    CAD Mother        Died of MI at age 57   Asthma Mother     Pulmonary embolism Mother        x2   Cancer - Prostate Father    Atrial fibrillation Father    Hyperlipidemia Father    Seizures Sister    Migraines Sister    Hypertension Brother    Hypertension Brother    Lung cancer Paternal Grandmother    Breast cancer Neg Hx    Allergic rhinitis Neg Hx    Eczema Neg Hx    Urticaria Neg Hx    Colon polyps Neg Hx    Colon cancer Neg Hx    Esophageal cancer Neg Hx    Stomach cancer Neg Hx    Rectal cancer Neg Hx    Social History:  reports that she quit smoking about 28 years ago. Her smoking use included cigarettes. She has never used smokeless tobacco. She reports that she does not currently use alcohol. She reports that she does not use drugs.  Allergies: No Known Allergies  No medications prior to admission.    No results found for this or any previous visit (from the past 48 hours). No results found.  Review of Systems no recent fever, chills, nausea, vomiting or changes in her appetite  Height 5' 5 (1.651 m), weight 65.8 kg. Physical Exam  Well-nourished well-developed woman in no apparent distress.  Alert and oriented.  Normal mood and affect.  Gait is normal.  Left foot has tenderness to palpation of the hallux MP joint.  Skin is healthy.  Decreased range of motion in plantarflexion and dorsiflexion through the MP joint.  5 out of 5 strength in plantarflexion and dorsiflexion of the ankle and toes.   Assessment/Plan Left hallux rigidus -to the operating room today for a minimally invasive cheilectomy.  The risks and benefits of the alternative treatment options have been discussed in detail.  The patient wishes to proceed with surgery and specifically understands risks of bleeding, infection, nerve damage, blood clots, need for additional surgery, amputation and death.   Norleen Armor, MD 2024/03/05, 11:25 AM

## 2024-02-04 NOTE — Anesthesia Preprocedure Evaluation (Addendum)
 Anesthesia Evaluation  Patient identified by MRN, date of birth, ID band Patient awake    Reviewed: Allergy  & Precautions, H&P , NPO status , Patient's Chart, lab work & pertinent test results  Airway Mallampati: II  TM Distance: >3 FB Neck ROM: Full    Dental  (+) Dental Advisory Given   Pulmonary neg pulmonary ROS, former smoker   Pulmonary exam normal breath sounds clear to auscultation       Cardiovascular negative cardio ROS Normal cardiovascular exam Rhythm:Regular Rate:Normal     Neuro/Psych  Headaches  Anxiety Depression     negative psych ROS   GI/Hepatic Neg liver ROS,GERD  ,,  Endo/Other  negative endocrine ROS    Renal/GU negative Renal ROS  negative genitourinary   Musculoskeletal negative musculoskeletal ROS (+)    Abdominal   Peds negative pediatric ROS (+)  Hematology negative hematology ROS (+)   Anesthesia Other Findings   Reproductive/Obstetrics negative OB ROS                              Anesthesia Physical Anesthesia Plan  ASA: 2  Anesthesia Plan: General   Post-op Pain Management:    Induction: Intravenous  PONV Risk Score and Plan: 3 and Ondansetron , Dexamethasone , Midazolam and Treatment may vary due to age or medical condition  Airway Management Planned: LMA  Additional Equipment:   Intra-op Plan:   Post-operative Plan: Extubation in OR  Informed Consent: I have reviewed the patients History and Physical, chart, labs and discussed the procedure including the risks, benefits and alternatives for the proposed anesthesia with the patient or authorized representative who has indicated his/her understanding and acceptance.     Dental advisory given  Plan Discussed with: CRNA  Anesthesia Plan Comments:         Anesthesia Quick Evaluation

## 2024-02-04 NOTE — Discharge Instructions (Addendum)
 Norleen Armor, MD [facility]  Please read the following information regarding your care after surgery.  Medications  You only need a prescription for the narcotic pain medicine (ex. oxycodone , Percocet, Norco).  All of the other medicines listed below are available over the counter. X Aleve 2 pills twice a day for the first 3 days after surgery. X hydrocodone  as prescribed for severe pain  Narcotic pain medicine (ex. oxycodone , Percocet, Vicodin) will cause constipation.  To prevent this problem, take the following medicines while you are taking any pain medicine. X docusate sodium (Colace) 100 mg twice a day X senna (Senokot) 2 tablets twice a day  Weight Bearing X Bear weight only on your operated foot in the post-op shoe.  Cast / Splint / Dressing X Remove your dressing 3 days after surgery and cover the incisions with dry dressings.    After your dressing, cast or splint is removed; you may shower, but do not soak or scrub the wound.  Allow the water to run over it, and then gently pat it dry.  Swelling It is normal for you to have swelling where you had surgery.  To reduce swelling and pain, keep your toes above your nose for at least 3 days after surgery.  It may be necessary to keep your foot or leg elevated for several weeks.  If it hurts, it should be elevated.  Follow Up Call my office at [phone] when you are discharged from the hospital or surgery center to schedule an appointment to be seen two weeks after surgery.  Call my office at [phone] if you develop a fever >101.5 F, nausea, vomiting, bleeding from the surgical site or severe pain.     Post Anesthesia Home Care Instructions  Activity: Get plenty of rest for the remainder of the day. A responsible individual must stay with you for 24 hours following the procedure.  For the next 24 hours, DO NOT: -Drive a car -Advertising copywriter -Drink alcoholic beverages -Take any medication unless instructed by your  physician -Make any legal decisions or sign important papers.  Meals: Start with liquid foods such as gelatin or soup. Progress to regular foods as tolerated. Avoid greasy, spicy, heavy foods. If nausea and/or vomiting occur, drink only clear liquids until the nausea and/or vomiting subsides. Call your physician if vomiting continues.  Special Instructions/Symptoms: Your throat may feel dry or sore from the anesthesia or the breathing tube placed in your throat during surgery. If this causes discomfort, gargle with warm salt water. The discomfort should disappear within 24 hours.  If you had a scopolamine patch placed behind your ear for the management of post- operative nausea and/or vomiting:  1. The medication in the patch is effective for 72 hours, after which it should be removed.  Wrap patch in a tissue and discard in the trash. Wash hands thoroughly with soap and water. 2. You may remove the patch earlier than 72 hours if you experience unpleasant side effects which may include dry mouth, dizziness or visual disturbances. 3. Avoid touching the patch. Wash your hands with soap and water after contact with the patch.

## 2024-02-05 ENCOUNTER — Encounter (HOSPITAL_BASED_OUTPATIENT_CLINIC_OR_DEPARTMENT_OTHER): Payer: Self-pay | Admitting: Orthopedic Surgery
